# Patient Record
Sex: Female | Born: 1940 | Race: White | Hispanic: No | Smoking: Former smoker
Health system: Southern US, Community
[De-identification: ages and names within clinical notes are randomized; demographics above are authoritative.]

## PROBLEM LIST (undated history)

## (undated) DIAGNOSIS — Z951 Presence of aortocoronary bypass graft: Secondary | ICD-10-CM

## (undated) DIAGNOSIS — I2510 Atherosclerotic heart disease of native coronary artery without angina pectoris: Secondary | ICD-10-CM

## (undated) DIAGNOSIS — E785 Hyperlipidemia, unspecified: Secondary | ICD-10-CM

## (undated) DIAGNOSIS — I340 Nonrheumatic mitral (valve) insufficiency: Secondary | ICD-10-CM

## (undated) DIAGNOSIS — I739 Peripheral vascular disease, unspecified: Secondary | ICD-10-CM

## (undated) DIAGNOSIS — IMO0002 Reserved for concepts with insufficient information to code with codable children: Secondary | ICD-10-CM

## (undated) DIAGNOSIS — F419 Anxiety disorder, unspecified: Secondary | ICD-10-CM

## (undated) DIAGNOSIS — O0382 Renal failure following complete or unspecified spontaneous abortion: Secondary | ICD-10-CM

## (undated) DIAGNOSIS — I4891 Unspecified atrial fibrillation: Secondary | ICD-10-CM

## (undated) DIAGNOSIS — K219 Gastro-esophageal reflux disease without esophagitis: Secondary | ICD-10-CM

## (undated) DIAGNOSIS — I10 Essential (primary) hypertension: Secondary | ICD-10-CM

## (undated) DIAGNOSIS — I779 Disorder of arteries and arterioles, unspecified: Secondary | ICD-10-CM

## (undated) DIAGNOSIS — M1990 Unspecified osteoarthritis, unspecified site: Secondary | ICD-10-CM

## (undated) DIAGNOSIS — Z9889 Other specified postprocedural states: Secondary | ICD-10-CM

## (undated) DIAGNOSIS — R5383 Other fatigue: Secondary | ICD-10-CM

## (undated) DIAGNOSIS — Z888 Allergy status to other drugs, medicaments and biological substances status: Secondary | ICD-10-CM

## (undated) DIAGNOSIS — D649 Anemia, unspecified: Secondary | ICD-10-CM

## (undated) DIAGNOSIS — N186 End stage renal disease: Secondary | ICD-10-CM

## (undated) DIAGNOSIS — I509 Heart failure, unspecified: Secondary | ICD-10-CM

## (undated) DIAGNOSIS — Z9229 Personal history of other drug therapy: Secondary | ICD-10-CM

## (undated) DIAGNOSIS — J449 Chronic obstructive pulmonary disease, unspecified: Secondary | ICD-10-CM

## (undated) DIAGNOSIS — R112 Nausea with vomiting, unspecified: Secondary | ICD-10-CM

## (undated) DIAGNOSIS — R9430 Abnormal result of cardiovascular function study, unspecified: Secondary | ICD-10-CM

## (undated) DIAGNOSIS — I219 Acute myocardial infarction, unspecified: Secondary | ICD-10-CM

## (undated) DIAGNOSIS — Z992 Dependence on renal dialysis: Secondary | ICD-10-CM

## (undated) HISTORY — DX: Heart failure, unspecified: I50.9

## (undated) HISTORY — DX: Anemia, unspecified: D64.9

## (undated) HISTORY — DX: Personal history of other drug therapy: Z92.29

## (undated) HISTORY — DX: Abnormal result of cardiovascular function study, unspecified: R94.30

## (undated) HISTORY — DX: Hyperlipidemia, unspecified: E78.5

## (undated) HISTORY — DX: Nonrheumatic mitral (valve) insufficiency: I34.0

## (undated) HISTORY — DX: Allergy status to other drugs, medicaments and biological substances: Z88.8

## (undated) HISTORY — DX: Peripheral vascular disease, unspecified: I73.9

## (undated) HISTORY — DX: Atherosclerotic heart disease of native coronary artery without angina pectoris: I25.10

## (undated) HISTORY — DX: Acute myocardial infarction, unspecified: I21.9

## (undated) HISTORY — DX: Disorder of arteries and arterioles, unspecified: I77.9

## (undated) HISTORY — PX: APPENDECTOMY: SHX54

## (undated) HISTORY — DX: Other fatigue: R53.83

## (undated) HISTORY — DX: Presence of aortocoronary bypass graft: Z95.1

## (undated) HISTORY — DX: Unspecified atrial fibrillation: I48.91

## (undated) HISTORY — DX: Essential (primary) hypertension: I10

## (undated) HISTORY — PX: ARTERIOVENOUS GRAFT PLACEMENT: SUR1029

## (undated) HISTORY — DX: Unspecified osteoarthritis, unspecified site: M19.90

## (undated) HISTORY — DX: Reserved for concepts with insufficient information to code with codable children: IMO0002

## (undated) HISTORY — PX: ABDOMINAL HYSTERECTOMY: SHX81

## (undated) HISTORY — PX: LAPAROSCOPIC SALPINGOOPHERECTOMY: SUR795

---

## 2001-09-23 HISTORY — PX: OTHER SURGICAL HISTORY: SHX169

## 2002-02-01 ENCOUNTER — Encounter: Payer: Self-pay | Admitting: Vascular Surgery

## 2002-02-03 ENCOUNTER — Ambulatory Visit (HOSPITAL_COMMUNITY): Admission: RE | Admit: 2002-02-03 | Discharge: 2002-02-03 | Payer: Self-pay | Admitting: Vascular Surgery

## 2002-02-08 ENCOUNTER — Inpatient Hospital Stay (HOSPITAL_COMMUNITY): Admission: RE | Admit: 2002-02-08 | Discharge: 2002-02-10 | Payer: Self-pay | Admitting: Vascular Surgery

## 2002-03-16 ENCOUNTER — Inpatient Hospital Stay (HOSPITAL_COMMUNITY): Admission: AD | Admit: 2002-03-16 | Discharge: 2002-03-24 | Payer: Self-pay | Admitting: Vascular Surgery

## 2002-04-15 ENCOUNTER — Encounter: Payer: Self-pay | Admitting: *Deleted

## 2002-04-19 ENCOUNTER — Inpatient Hospital Stay (HOSPITAL_COMMUNITY): Admission: RE | Admit: 2002-04-19 | Discharge: 2002-04-22 | Payer: Self-pay | Admitting: Vascular Surgery

## 2002-09-23 HISTORY — PX: BASAL CELL CARCINOMA EXCISION: SHX1214

## 2002-09-23 HISTORY — PX: OTHER SURGICAL HISTORY: SHX169

## 2006-07-24 ENCOUNTER — Inpatient Hospital Stay (HOSPITAL_COMMUNITY): Admission: AD | Admit: 2006-07-24 | Discharge: 2006-08-02 | Payer: Self-pay | Admitting: Cardiology

## 2006-07-24 ENCOUNTER — Ambulatory Visit: Payer: Self-pay | Admitting: Cardiology

## 2006-07-25 ENCOUNTER — Encounter: Payer: Self-pay | Admitting: Vascular Surgery

## 2006-07-26 ENCOUNTER — Encounter (INDEPENDENT_AMBULATORY_CARE_PROVIDER_SITE_OTHER): Payer: Self-pay | Admitting: *Deleted

## 2006-07-26 ENCOUNTER — Encounter: Payer: Self-pay | Admitting: Cardiovascular Disease

## 2006-07-27 HISTORY — PX: CORONARY ARTERY BYPASS GRAFT: SHX141

## 2006-08-15 ENCOUNTER — Ambulatory Visit: Payer: Self-pay | Admitting: Cardiology

## 2006-09-12 ENCOUNTER — Ambulatory Visit: Payer: Self-pay | Admitting: Cardiology

## 2006-10-17 ENCOUNTER — Ambulatory Visit: Payer: Self-pay | Admitting: Cardiology

## 2006-10-23 ENCOUNTER — Ambulatory Visit: Payer: Self-pay | Admitting: Cardiology

## 2006-12-18 ENCOUNTER — Ambulatory Visit: Payer: Self-pay | Admitting: Cardiology

## 2007-01-13 ENCOUNTER — Ambulatory Visit: Payer: Self-pay | Admitting: Nurse Practitioner

## 2008-04-11 ENCOUNTER — Ambulatory Visit: Payer: Self-pay | Admitting: Cardiology

## 2008-08-05 ENCOUNTER — Ambulatory Visit: Payer: Self-pay | Admitting: Cardiology

## 2008-08-08 ENCOUNTER — Encounter: Payer: Self-pay | Admitting: Cardiology

## 2008-09-25 ENCOUNTER — Encounter: Payer: Self-pay | Admitting: Cardiology

## 2008-09-26 ENCOUNTER — Encounter (INDEPENDENT_AMBULATORY_CARE_PROVIDER_SITE_OTHER): Payer: Self-pay | Admitting: Internal Medicine

## 2008-09-26 ENCOUNTER — Encounter: Payer: Self-pay | Admitting: Cardiology

## 2008-09-26 ENCOUNTER — Inpatient Hospital Stay (HOSPITAL_COMMUNITY): Admission: EM | Admit: 2008-09-26 | Discharge: 2008-09-28 | Payer: Self-pay | Admitting: Internal Medicine

## 2008-09-26 ENCOUNTER — Encounter: Payer: Self-pay | Admitting: Internal Medicine

## 2008-09-26 ENCOUNTER — Ambulatory Visit: Payer: Self-pay | Admitting: Internal Medicine

## 2008-09-27 ENCOUNTER — Encounter (INDEPENDENT_AMBULATORY_CARE_PROVIDER_SITE_OTHER): Payer: Self-pay | Admitting: Nephrology

## 2008-09-27 ENCOUNTER — Ambulatory Visit: Payer: Self-pay | Admitting: Surgery

## 2008-09-27 ENCOUNTER — Encounter: Payer: Self-pay | Admitting: Cardiology

## 2008-10-03 ENCOUNTER — Ambulatory Visit: Payer: Self-pay | Admitting: Cardiology

## 2008-10-03 ENCOUNTER — Encounter: Payer: Self-pay | Admitting: Cardiology

## 2008-10-10 ENCOUNTER — Ambulatory Visit: Payer: Self-pay | Admitting: Vascular Surgery

## 2008-10-12 ENCOUNTER — Ambulatory Visit: Payer: Self-pay | Admitting: Cardiology

## 2008-10-19 ENCOUNTER — Ambulatory Visit (HOSPITAL_COMMUNITY): Admission: RE | Admit: 2008-10-19 | Discharge: 2008-10-19 | Payer: Self-pay | Admitting: Vascular Surgery

## 2008-11-01 ENCOUNTER — Encounter: Payer: Self-pay | Admitting: Cardiology

## 2008-11-01 ENCOUNTER — Inpatient Hospital Stay (HOSPITAL_COMMUNITY): Admission: AD | Admit: 2008-11-01 | Discharge: 2008-11-02 | Payer: Self-pay | Admitting: Internal Medicine

## 2008-11-30 ENCOUNTER — Ambulatory Visit: Payer: Self-pay | Admitting: Cardiology

## 2009-01-02 ENCOUNTER — Ambulatory Visit: Payer: Self-pay | Admitting: Cardiology

## 2009-04-18 ENCOUNTER — Encounter: Payer: Self-pay | Admitting: Cardiology

## 2009-05-31 ENCOUNTER — Ambulatory Visit (HOSPITAL_COMMUNITY): Admission: RE | Admit: 2009-05-31 | Discharge: 2009-05-31 | Payer: Self-pay | Admitting: Nephrology

## 2009-07-06 DIAGNOSIS — I080 Rheumatic disorders of both mitral and aortic valves: Secondary | ICD-10-CM

## 2009-07-06 DIAGNOSIS — I10 Essential (primary) hypertension: Secondary | ICD-10-CM | POA: Insufficient documentation

## 2009-07-27 ENCOUNTER — Encounter: Payer: Self-pay | Admitting: Cardiology

## 2009-07-27 DIAGNOSIS — N186 End stage renal disease: Secondary | ICD-10-CM

## 2009-07-28 ENCOUNTER — Ambulatory Visit: Payer: Self-pay | Admitting: Cardiology

## 2009-09-09 ENCOUNTER — Ambulatory Visit (HOSPITAL_COMMUNITY): Admission: RE | Admit: 2009-09-09 | Discharge: 2009-09-09 | Payer: Self-pay | Admitting: Internal Medicine

## 2009-10-04 ENCOUNTER — Encounter: Payer: Self-pay | Admitting: Cardiology

## 2009-12-25 ENCOUNTER — Ambulatory Visit (HOSPITAL_COMMUNITY): Admission: RE | Admit: 2009-12-25 | Discharge: 2009-12-25 | Payer: Self-pay | Admitting: Nephrology

## 2010-03-27 ENCOUNTER — Ambulatory Visit: Payer: Self-pay | Admitting: Cardiology

## 2010-03-27 ENCOUNTER — Encounter: Payer: Self-pay | Admitting: Cardiology

## 2010-03-29 ENCOUNTER — Encounter: Payer: Self-pay | Admitting: Cardiology

## 2010-04-16 ENCOUNTER — Ambulatory Visit: Payer: Self-pay | Admitting: Physician Assistant

## 2010-10-17 ENCOUNTER — Other Ambulatory Visit (HOSPITAL_COMMUNITY): Payer: Self-pay | Admitting: Nephrology

## 2010-10-17 DIAGNOSIS — N186 End stage renal disease: Secondary | ICD-10-CM

## 2010-10-23 NOTE — Letter (Signed)
Summary: [facility] D/C DR. Selinda Flavin  [facility] D[facility]C DR. Selinda Flavin   Imported By: Zachary George 04/02/2010 13:06:06  _____________________________________________________________________  External Attachment:    Type:   Image     Comment:   External Document

## 2010-10-23 NOTE — Consult Note (Signed)
Summary: CARDIOLOGY CONSULT/ [facility]  CARDIOLOGY CONSULT/ [facility]   Imported By: Zachary George 04/02/2010 13:34:05  _____________________________________________________________________  External Attachment:    Type:   Image     Comment:   External Document

## 2010-10-23 NOTE — Assessment & Plan Note (Signed)
Summary: eph d/c [facility]-7   Visit Type:  hospital follow-up Primary Provider:  Selinda Flavin MD   History of Present Illness: patient presents for post hospital followup.  She was recently hospitalized here with acute/chronic congestive heart failure. She had minimally elevated troponins, as noted by Dr. Myrtis Ser, who felt that this could be attributed to her CHF. She was essentially treated for volume overload, and had as much as 8 pounds of fluid removed during dialysis.  Echocardiography indicated decrease in LVF (EF 25-30%), compared to previous study of 45-50% in November 2009.  Clinically, she reports today that she is "feeling fine". She denies any symptoms suggestive of decompensated heart failure. She denies chest pain.   Preventive Screening-Counseling & Management  Alcohol-Tobacco     Smoking Status: quit     Year Quit: 2003  Current Medications (verified): 1)  Clonidine Hcl 0.1 Mg Tabs (Clonidine Hcl) .... Take 1 Tablet By Mouth Twice A Day 2)  Toprol Xl 100 Mg Xr24h-Tab (Metoprolol Succinate) .... Take 1/2 Tablet By Mouth Three Times A Day 3)  Renvela 800 Mg Tabs (Sevelamer Carbonate) .... 6 At Meals  and 3 At Snacks 4)  Lisinopril 10 Mg Tabs (Lisinopril) .... Take 1 Tablet By Mouth Once A Day 5)  Plavix 75 Mg Tabs (Clopidogrel Bisulfate) .... Take 1 Tablet By Mouth Once A Day 6)  Isosorbide Mononitrate Cr 30 Mg Xr24h-Tab (Isosorbide Mononitrate) .... Take 1 Tablet By Mouth Once A Day 7)  Dialyvite 3000 3 Mg Tabs (B Complex-C-Biotin-E-Min-Fa) .... Take 1 Tablet By Mouth Once A Day 8)  Alprazolam 0.5 Mg Tabs (Alprazolam) .... Take 1 Tablet By Mouth Three Times A Day 9)  Hydrocodone-Acetaminophen 7.5-500 Mg Tabs (Hydrocodone-Acetaminophen) .... Take 1 Tablet By Mouth Three Times A Day 10)  Lidocaine-Prilocaine 2.5-2.5 % Kit (Lidocaine-Prilocaine) .... Apply 1 Hour Before Dialysis 11)  Humulin N 100 Unit/ml Susp (Insulin Isophane Human) .... Use As Directed  Allergies  (verified): 1)  ! Pcn 2)  ! Asa  Comments:  Nurse/Medical Assistant: The patient's medication list and allergies were reviewed with the patient and were updated in the Medication and Allergy Lists.  Past History:  Past Medical History: Last updated: 07/28/2009 MITRAL REGURGITATION (ICD-396.3)...mild to moderate.. echo.. January, 2010 EF... 40%.. echo... January, 2010... hypokinesis base/mid septum  and mid/distal inferoposterior wall HYPERTENSION, UNSPECIFIED (ICD-401.9) HYPERLIPIDEMIA-MIXED (ICD-272.4) CAD.Marland KitchenMarland KitchenCardiolite... January, 2010... no ischemia CABG...2007 CARDIOMYOPATHY, ISCHEMIC (ICD-414.8) ESRD...dialysis... right arm shunt..  /  . PCI to the shunt... interventional radiology... September, 2010 Diabetes... insulin-dependent Anemia... chronic Peripheral arterial disease...right axillofemoral bypass.. Dr.Early Aspirin... allergy.... takes Plavix  Review of Systems       No fevers, chills, hemoptysis, dysphagia, melena, hematocheezia, hematuria, rash, claudication, orthopnea, pnd, pedal edema. All other systems negative.   Vital Signs:  Patient profile:   70 year old female Height:      65 inches Weight:      126 pounds Pulse rate:   84 / minute BP sitting:   165 / 70  (left arm) Cuff size:   regular  Vitals Entered By: Carlye Grippe (April 16, 2010 1:34 PM)  Physical Exam  Additional Exam:  GEN:70 year old female, sitting upright, no distress HEENT: NCAT,PERRLA,EOMI NECK: palpable pulses, right-sided bruits; no JVD; no TM LUNGS: CTA bilaterally HEART: RRR (S1S2); soft, grade 2/6 systolic ejection murmur; no rubs; no gallops ABD: soft, NT; intact BS EXT: intact distal pulses; no edema SKIN: warm, dry MUSC: no obvious deformity NEURO: A/O (x3)     Impression & Recommendations:  Problem # 1:  CARDIOMYOPATHY, ISCHEMIC (ICD-414.8)  patient is clinically stable, following recent hospitalization for volume overload/CHF, successfully treated with  aggressive hemodialysis. She denies any chest pain. Therefore, no further cardiac workup at this time. Recent echocardiogram does suggest a decline in LVF  (EF 25-30%). Will plan early clinic followup with Dr. Myrtis Ser in 3 months, at which time we may need to consider a repeat echocardiogram. If she demonstrates persistently depressed LVEF, we may need to consider her as a possible candidate for ICD implantation.  Problem # 2:  ESRD (ICD-585.6) Assessment: Comment Only  Problem # 3:  HYPERTENSION, UNSPECIFIED (ICD-401.9)  followed by Drs. Dimas Aguas and [place]. Patient reports readings of 140-150 systolic range, at home. No medication adjustments recommended today.  Problem # 4:  PVD (ICD-443.9)  followup Dr. Tawanna Cooler Early.  Patient Instructions: 1)  Your physician wants you to follow-up in: 3 months. You will receive a reminder letter in the mail one-two months in advance. If you don't receive a letter, please call our office to schedule the follow-up appointment. 2)  Your physician recommends that you continue on your current medications as directed. Please refer to the Current Medication list given to you today.

## 2010-10-23 NOTE — Letter (Signed)
Summary: External Correspondence/ OFFICE NOTE DR. HOWARD  External Correspondence/ OFFICE NOTE DR. HOWARD   Imported By: Dorise Hiss 10/10/2009 10:46:34  _____________________________________________________________________  External Attachment:    Type:   Image     Comment:   External Document

## 2010-10-31 ENCOUNTER — Ambulatory Visit (HOSPITAL_COMMUNITY)
Admission: RE | Admit: 2010-10-31 | Discharge: 2010-10-31 | Disposition: A | Discharge status: Home / Self Care | Payer: Medicare Other | Source: Ambulatory Visit | Attending: Nephrology | Admitting: Nephrology

## 2010-10-31 ENCOUNTER — Other Ambulatory Visit (HOSPITAL_COMMUNITY): Payer: Self-pay

## 2010-10-31 DIAGNOSIS — Y849 Medical procedure, unspecified as the cause of abnormal reaction of the patient, or of later complication, without mention of misadventure at the time of the procedure: Secondary | ICD-10-CM | POA: Insufficient documentation

## 2010-10-31 DIAGNOSIS — N186 End stage renal disease: Secondary | ICD-10-CM | POA: Insufficient documentation

## 2010-10-31 DIAGNOSIS — T82898A Other specified complication of vascular prosthetic devices, implants and grafts, initial encounter: Secondary | ICD-10-CM | POA: Insufficient documentation

## 2010-10-31 MED ORDER — IOHEXOL 300 MG/ML  SOLN: 36.0000 mL | Freq: Once | INTRAMUSCULAR | Status: AC | PRN

## 2010-11-16 ENCOUNTER — Ambulatory Visit (INDEPENDENT_AMBULATORY_CARE_PROVIDER_SITE_OTHER): Payer: Medicare Other

## 2010-11-16 DIAGNOSIS — N186 End stage renal disease: Secondary | ICD-10-CM

## 2010-11-16 NOTE — H&P (Signed)
HISTORY AND PHYSICAL EXAMINATION  November 16, 2010  Re:  [name], [name] CORUM                  DOB:  1941/05/21  HISTORY OF PRESENT ILLNESS:  The patient is a 70 year old Caucasian female who presents today at the request of her nephrologist regarding her right forearm AVG.  The patient dialyzes on Tuesday, Thursday and Saturday and this graft was placed by Dr. Hart Rochester on 10/19/2008.  The patient has been utilizing this access since that time with intermittent difficulties.  She states that the dialysis tech informs her on occasion that she has a poor flow rate.  The patient states that she has had interventions by interventional radiology at [facility] and at [facility] with the most recent intervention at [facility] last week.  The patient states that she has been told that she has had angioplasties during these procedures but none of these records are available for review. The patient states that at her most recent evaluation last week in [place] she was told that angioplasty was not possible and that she would need to see a surgeon regarding revision of her graft.  The patient states that she is still dialyzing through the graft despite the fact that she has been told that the flow rate is decreased.  The patient denies chest pain, shortness of breath, CVA, TIA symptoms, claudication symptoms, nausea, vomiting, diarrhea and constipation.  PAST MEDICAL AND SURGICAL HISTORY: 1. End-stage renal disease, dialysis Tuesday, Thursday, Saturday.     Nephrologist, Dr. Kristian Covey. 2. Coronary artery disease status post CABG 2007. 3. Hypertension. 4. MI 2007. 5. Diabetes mellitus. 6. Dyslipidemia. 7. COPD. 8. History of tobacco abuse. 9. Peripheral vascular disease status post right axfem bypass.  SOCIAL HISTORY:  The patient lives at home and is retired.  She smoked one to two packs per day for approximately 45 years.  She denies alcohol use.  FAMILY HISTORY:  Sister with  end-stage renal disease.  ALLERGIES: 1. Aspirin. 2. Penicillin. 3. Nonsteroidal anti-inflammatories.  MEDICATIONS:  The patient did not bring a list of her medications with her.  REVIEW OF SYSTEMS:  A complete review of systems is negative except as delineated in the HPI.  PHYSICAL EXAM:  Vital signs:  Blood pressure 177/75, O2 saturation 89, heart rate 91.  General:  This is a well-nourished female in no acute distress.  HEENT:  PERRLA.  EOMI.  Conjunctivae are normal.  Lungs: Clear to auscultation.  Cardiovascular:  Regular rate and rhythm. Abdomen:  Soft, nontender with active bowel sounds.  Musculoskeletal: No major deformities or cyanosis are noted.  There are 2+ radial and ulnar pulses present at the bilateral upper extremities.  2+ posterior tibial pulses present bilaterally.  The axfem bypass graft has a palpable pulse.  The patient has a right forearm AVG with thrill and bruit present.  There is ecchymosis along the medial aspect of the upper arm from her procedure performed last week in [place].  Neuro:  No focal weakness or paresthesias.  Skin:  No ulcers or rashes.  ASSESSMENT: 1. End-stage renal disease with dialysis Tuesday, Thursday, Saturday     with a decreased flow rate in her right forearm AVG per dialysis     techs. 2. Hypertension. 3. Peripheral vascular disease.  The patient is discussed with Dr. Imogene Burn who agrees that the patient should undergo a shuntogram with possible intervention by him on 11/26/2010.  This was discussed with the patient.  She understands and  agrees to proceed.  The remainder of the patient's medical issues will continue to be monitored and regulated by the medical service.  [name], [place]  Fransisco Hertz, MD Electronically Signed  AY/MEDQ  D:  11/16/2010  T:  11/16/2010  Job:  [id number]

## 2010-11-19 ENCOUNTER — Encounter: Payer: Self-pay | Admitting: Cardiology

## 2010-11-21 ENCOUNTER — Encounter: Payer: Self-pay | Admitting: Cardiology

## 2010-11-26 ENCOUNTER — Encounter (INDEPENDENT_AMBULATORY_CARE_PROVIDER_SITE_OTHER): Payer: Self-pay | Admitting: *Deleted

## 2010-11-26 ENCOUNTER — Ambulatory Visit (HOSPITAL_COMMUNITY)
Admission: RE | Admit: 2010-11-26 | Discharge: 2010-11-26 | Disposition: A | Discharge status: Home / Self Care | Payer: Medicare Other | Source: Ambulatory Visit | Attending: Vascular Surgery | Admitting: Vascular Surgery

## 2010-11-26 ENCOUNTER — Encounter: Payer: Self-pay | Admitting: Vascular Surgery

## 2010-11-26 DIAGNOSIS — E119 Type 2 diabetes mellitus without complications: Secondary | ICD-10-CM | POA: Insufficient documentation

## 2010-11-26 DIAGNOSIS — Z01810 Encounter for preprocedural cardiovascular examination: Secondary | ICD-10-CM | POA: Insufficient documentation

## 2010-11-26 DIAGNOSIS — I120 Hypertensive chronic kidney disease with stage 5 chronic kidney disease or end stage renal disease: Secondary | ICD-10-CM

## 2010-11-26 DIAGNOSIS — N186 End stage renal disease: Secondary | ICD-10-CM | POA: Insufficient documentation

## 2010-11-26 DIAGNOSIS — I2510 Atherosclerotic heart disease of native coronary artery without angina pectoris: Secondary | ICD-10-CM | POA: Insufficient documentation

## 2010-11-26 DIAGNOSIS — Z87891 Personal history of nicotine dependence: Secondary | ICD-10-CM | POA: Insufficient documentation

## 2010-11-26 DIAGNOSIS — T82898A Other specified complication of vascular prosthetic devices, implants and grafts, initial encounter: Secondary | ICD-10-CM

## 2010-11-26 DIAGNOSIS — E785 Hyperlipidemia, unspecified: Secondary | ICD-10-CM | POA: Insufficient documentation

## 2010-11-26 DIAGNOSIS — I739 Peripheral vascular disease, unspecified: Secondary | ICD-10-CM | POA: Insufficient documentation

## 2010-11-26 DIAGNOSIS — J449 Chronic obstructive pulmonary disease, unspecified: Secondary | ICD-10-CM | POA: Insufficient documentation

## 2010-11-26 DIAGNOSIS — I252 Old myocardial infarction: Secondary | ICD-10-CM | POA: Insufficient documentation

## 2010-11-26 DIAGNOSIS — J4489 Other specified chronic obstructive pulmonary disease: Secondary | ICD-10-CM | POA: Insufficient documentation

## 2010-11-26 DIAGNOSIS — Z992 Dependence on renal dialysis: Secondary | ICD-10-CM | POA: Insufficient documentation

## 2010-11-26 DIAGNOSIS — Z951 Presence of aortocoronary bypass graft: Secondary | ICD-10-CM | POA: Insufficient documentation

## 2010-11-26 DIAGNOSIS — Y832 Surgical operation with anastomosis, bypass or graft as the cause of abnormal reaction of the patient, or of later complication, without mention of misadventure at the time of the procedure: Secondary | ICD-10-CM | POA: Insufficient documentation

## 2010-11-27 LAB — POCT I-STAT, CHEM 8
BUN: 63 mg/dL — ABNORMAL HIGH (ref 6–23)
Calcium, Ion: 1.25 mmol/L (ref 1.12–1.32)
Chloride: 111 mEq/L (ref 96–112)
Glucose, Bld: 108 mg/dL — ABNORMAL HIGH (ref 70–99)
HCT: 30 % — ABNORMAL LOW (ref 36.0–46.0)
Potassium: 5.2 mEq/L — ABNORMAL HIGH (ref 3.5–5.1)

## 2010-11-28 ENCOUNTER — Encounter: Payer: Self-pay | Admitting: Cardiology

## 2010-11-28 DIAGNOSIS — I5022 Chronic systolic (congestive) heart failure: Secondary | ICD-10-CM

## 2010-11-30 ENCOUNTER — Ambulatory Visit (HOSPITAL_COMMUNITY): Payer: Medicare Other

## 2010-11-30 ENCOUNTER — Encounter: Payer: Self-pay | Admitting: Vascular Surgery

## 2010-11-30 ENCOUNTER — Ambulatory Visit (HOSPITAL_COMMUNITY)
Admission: RE | Admit: 2010-11-30 | Discharge: 2010-11-30 | Disposition: A | Discharge status: Home / Self Care | Payer: Medicare Other | Source: Ambulatory Visit | Attending: Vascular Surgery | Admitting: Vascular Surgery

## 2010-11-30 DIAGNOSIS — E785 Hyperlipidemia, unspecified: Secondary | ICD-10-CM | POA: Insufficient documentation

## 2010-11-30 DIAGNOSIS — T82898A Other specified complication of vascular prosthetic devices, implants and grafts, initial encounter: Secondary | ICD-10-CM

## 2010-11-30 DIAGNOSIS — Y832 Surgical operation with anastomosis, bypass or graft as the cause of abnormal reaction of the patient, or of later complication, without mention of misadventure at the time of the procedure: Secondary | ICD-10-CM | POA: Insufficient documentation

## 2010-11-30 DIAGNOSIS — I739 Peripheral vascular disease, unspecified: Secondary | ICD-10-CM | POA: Insufficient documentation

## 2010-11-30 DIAGNOSIS — I120 Hypertensive chronic kidney disease with stage 5 chronic kidney disease or end stage renal disease: Secondary | ICD-10-CM

## 2010-11-30 DIAGNOSIS — F17200 Nicotine dependence, unspecified, uncomplicated: Secondary | ICD-10-CM | POA: Insufficient documentation

## 2010-11-30 DIAGNOSIS — J449 Chronic obstructive pulmonary disease, unspecified: Secondary | ICD-10-CM | POA: Insufficient documentation

## 2010-11-30 DIAGNOSIS — Z01810 Encounter for preprocedural cardiovascular examination: Secondary | ICD-10-CM | POA: Insufficient documentation

## 2010-11-30 DIAGNOSIS — I252 Old myocardial infarction: Secondary | ICD-10-CM | POA: Insufficient documentation

## 2010-11-30 DIAGNOSIS — E119 Type 2 diabetes mellitus without complications: Secondary | ICD-10-CM | POA: Insufficient documentation

## 2010-11-30 DIAGNOSIS — N186 End stage renal disease: Secondary | ICD-10-CM

## 2010-11-30 DIAGNOSIS — I2510 Atherosclerotic heart disease of native coronary artery without angina pectoris: Secondary | ICD-10-CM | POA: Insufficient documentation

## 2010-11-30 DIAGNOSIS — Z951 Presence of aortocoronary bypass graft: Secondary | ICD-10-CM | POA: Insufficient documentation

## 2010-11-30 DIAGNOSIS — J4489 Other specified chronic obstructive pulmonary disease: Secondary | ICD-10-CM | POA: Insufficient documentation

## 2010-11-30 DIAGNOSIS — Z7982 Long term (current) use of aspirin: Secondary | ICD-10-CM | POA: Insufficient documentation

## 2010-11-30 LAB — POCT I-STAT 4, (NA,K, GLUC, HGB,HCT)
Glucose, Bld: 135 mg/dL — ABNORMAL HIGH (ref 70–99)
HCT: 31 % — ABNORMAL LOW (ref 36.0–46.0)
Hemoglobin: 10.5 g/dL — ABNORMAL LOW (ref 12.0–15.0)
Sodium: 140 mEq/L (ref 135–145)

## 2010-11-30 LAB — SURGICAL PCR SCREEN
MRSA, PCR: NEGATIVE
Staphylococcus aureus: POSITIVE — AB

## 2010-11-30 LAB — GLUCOSE, CAPILLARY: Glucose-Capillary: 144 mg/dL — ABNORMAL HIGH (ref 70–99)

## 2010-11-30 NOTE — Op Note (Signed)
NAMEDENYCE, HARR NO.:  [id number]  MEDICAL RECORD NO.:  [id number]           PATIENT TYPE:  O  LOCATION:  [facility]                         FACILITY:  [facility]  PHYSICIAN:  Fransisco Hertz, MD       DATE OF BIRTH:  [birth year]  DATE OF PROCEDURE:  11/26/2010 DATE OF DISCHARGE:  11/26/2010                              OPERATIVE REPORT   PROCEDURE: 1. Right forearm arteriovenous graft cannulation under ultrasound     guidance. 2. Shuntogram, right arteriovenous graft. 3. Right central venogram.  PREOPERATIVE DIAGNOSIS:  Outflow stenosis, right arteriovenous graft.  POSTOPERATIVE DIAGNOSIS:  Outflow stenosis, right arteriovenous graft.  SURGEON:  Fransisco Hertz, MD.  ANESTHESIA:  Local.  ESTIMATED BLOOD LOSS:  Minimal.  CONTRAST:  27 mL.  There were no specimens in this case.  FINDINGS:   1.  Patent right arteriovenous graft with an occluded venous outflow at the  level of the basilic vein 2. Stenosis is evident toward the venous anastomosis 3. There is evidence of pressurization of the forearm basilic vein and some of  the side branches which then pressurize a crossing vein which pressurizes the forearm cephalic vein 4. This then continuous into the upper arm as a pressurized cephalic vein, so in fact  at this point, the venous outflow of this arteriovenous graft is actually through this  forearm basilic vein and also the forearm cephalic vein.   5. In the upper arm, the cephalic vein continues with about 4-5 mm diameter up to the  level of its confluence with the axillary vein, which is widely patent as is subclavian  vein and right innominate vein.   6. There is evidence of a small brachial vein that appeared that also empty at the confluence  of the axillary vein.  INDICATIONS:  This is a 70 year old female that previously has undergone a right forearm arteriovenous graft that then went on to require multiple percutaneous interventions.  Most recently  was sent to [facility] for evaluation, it is unclear exactly what they did at the location, but on followup in clinic, it was felt that no revision should be done until further shuntogram was completed to determine what her access options were, so we discussed doing a shuntogram in the right side and then making our operative plan off of that.  DESCRIPTION OF OPERATION:  After full informed written consent was obtained from the patient, she was brought back to the angio suite and placed supine upon the angio table.  I interrogated the graft near its arterial anastomosis, it was noted to be widely patent, then cannulated the graft with a micropuncture needle under ultrasound guidance after injecting about 1 mL of 1% lidocaine without epi.  I then passed the wire into the arteriovenous graft via the micro needle and then needle was exchanged for the micro sheath.  This is secured in place with a Tegaderm.  The sheath was then connected to IV extension tubing and then hand injections were completed, the findings of which are listed above. Based on these findings, I feel that this patient's best next access option  is actually conversion of this forearm arteriovenous graft to a right upper arm brachiocephalic arteriovenous fistula.  Unfortunately the  vein outflow was not quite dilated enough for use immediately, so temporary  placement of a tunneled dialysis catheter may be needed.  She would like to have  this done on Friday.  At this point, I felt no more intervention was possible, so  I placed a pursestring suture around the cannulation site with a 4-0 Monocryl and  then pressure was held as the sheath was removed and the suture tied down.  A  small hematoma was present at the cannulation site, which diffused with some gentle  pressure.    COMPLICATIONS:  None.  CONDITION:  Stable.     Fransisco Hertz, MD     BLC/MEDQ  D:  11/26/2010  T:  11/27/2010  Job:  [id number]  Electronically  Signed by Leonides Sake MD on 11/29/2010 09:32:19 AM

## 2010-12-04 ENCOUNTER — Encounter: Payer: Self-pay | Admitting: Cardiology

## 2010-12-04 NOTE — Op Note (Signed)
[name], [name]                ACCOUNT NO.:  [id number]  MEDICAL RECORD NO.:  [id number]           PATIENT TYPE:  O  LOCATION:  [facility]                         FACILITY:  [facility]  PHYSICIAN:  Larina Earthly, M.D.    DATE OF BIRTH:  [birth year]  DATE OF PROCEDURE:  11/30/2010 DATE OF DISCHARGE:  11/30/2010                              OPERATIVE REPORT   PREOPERATIVE DIAGNOSIS:  End-stage renal disease with poorly functioning right forearm loop arteriovenous Gore-Tex graft.  POSTOPERATIVE DIAGNOSIS:  End-stage renal disease with poorly functioning right forearm loop arteriovenous Gore-Tex graft.  PROCEDURES: 1. Placement of left IJ Diatek catheter with ultrasound visualization. 2. Ligation of right forearm loop arteriovenous Gore-Tex graft and     creation of right upper arm arteriovenous fistula with     brachiocephalic fistula.  SURGEON:  Larina Earthly, MD  ASSISTANT:  Della Goo, PA-C  ANESTHESIA:  MAC.  COMPLICATIONS:  None. DISPOSITION:  To recovery room, stable with chest x-ray pending.  PROCEDURE IN DETAIL:  The patient was taken to the operating room, placed in supine position where the area of the right and left neck were imaged with ultrasound.  The patient had widely patent jugular veins bilaterally.  The patient did have a patent right ax-fem bypass.  For this reason, incision was made to place left-sided catheter.  The patient was placed in Trendelenburg position.  The right and left neck and chest prepped and draped in usual sterile fashion.  Using local anesthesia and a finder needle, the left internal jugular vein was accessed.  Guidewire was passed down centrally and would not pass into the right atrium, but would go back up the right innominate vein.  For this reason, the guiding catheter was passed over the guidewire and this was then directed down to the level of right atrium.  Dilator and peel- away sheath was passed over the guidewire.  The dilator  was removed and the catheter was placed over the guidewire through the peel-away sheath down to the level of right atrium.  The peel-away sheath and guidewire were removed.  The catheter was brought through the subcutaneous tunnel through a separate stab incision.  Two lumen ports were attached in both locked with heparinized saline.  The catheter was secured to the skin with 3-0 nylon stitch.  Entry site was closed with a 4-0 subcuticular Vicryl stitch.  Sterile dressing was applied.  Next, the right arm was prepped and draped in usual sterile fashion.  The patient had well- developed cephalic vein with an occlusion of her basilic vein outflow from her forearm loop graft.  Using local anesthesia, incision was made over the antecubital space, carried down to isolate the arterial to graft anastomosis and the brachial artery was exposed proximal and distal to this.  The venous anastomosis was also exposed.  The cephalic vein was exposed through the same incision and was mobilized further proximally.  The vein was ligated distally, divided, and mobilized to the level of the brachial artery.  The artery was occluded proximally and distally to the old arterial graft anastomosis.  The old arterial graft  anastomosis was taken down and the old graft was excised.  The vein was ligated at the level of the old venous anastomosis and a segment of the graft was resected at the antecubital space.  The cephalic vein was brought into approximation with the brachial artery, was spatulated and sewn end-to-side to the artery with a running 6-0 Prolene suture.  Clamps were removed and excellent thrill was noted. The wound was irrigated with saline.  Hemostasis with electrocautery. Wound was closed with 3-0 Vicryl in the subcutaneous and subcuticular tissues.  Benzoin and Steri-Strips were applied.     Larina Earthly, M.D.     TFE/MEDQ  D:  11/30/2010  T:  12/01/2010  Job:  [id number]  Electronically  Signed by TODD EARLY M.D. on 12/04/2010 09:20:02 AM

## 2010-12-05 ENCOUNTER — Encounter: Payer: Self-pay | Admitting: Cardiology

## 2010-12-05 ENCOUNTER — Ambulatory Visit (INDEPENDENT_AMBULATORY_CARE_PROVIDER_SITE_OTHER): Payer: Medicare Other | Admitting: Cardiology

## 2010-12-05 DIAGNOSIS — I428 Other cardiomyopathies: Secondary | ICD-10-CM

## 2010-12-05 DIAGNOSIS — I2510 Atherosclerotic heart disease of native coronary artery without angina pectoris: Secondary | ICD-10-CM

## 2010-12-11 NOTE — Assessment & Plan Note (Signed)
Summary: abnormal echo per patient that dr. Dimas Aguas ordered -srs   Visit Type:  Follow-up Primary Provider:  Selinda Flavin MD  CC:  CAD.  History of Present Illness: Patient is seen for followup of coronary artery disease.  She had been seen last in the office in July, 2011.  It has been noticed that there was a decrease in ejection fraction.  Plans were made to try to adjust her medications and follow her left ventricle.  She has had some clinical CHF.  Her dry weight is now decreased with dialysis.  She is feeling better.  The graft in her right arm failed.  She has a catheter in the left subclavian.  In the past few days she has a new AV fistula in the right arm.  She's stable today.  He's not having any chest pain.  As part of today's note I have reviewed an echo report from November 28, 2010.  I reviewed surgical reports concerning the surgery to her arm.  I have reviewed old records.  Preventive Screening-Counseling & Management  Alcohol-Tobacco     Smoking Status: quit     Year Quit: 2003  Current Medications (verified): 1)  Clonidine Hcl 0.1 Mg Tabs (Clonidine Hcl) .... Take 1 Tablet By Mouth Twice A Day 2)  Renvela 800 Mg Tabs (Sevelamer Carbonate) .... 6 At Meals  and 3 At Snacks 3)  Lisinopril 20 Mg Tabs (Lisinopril) .... Take 1 Tablet By Mouth Once A Day 4)  Plavix 75 Mg Tabs (Clopidogrel Bisulfate) .... Take 1 Tablet By Mouth Once A Day 5)  Isosorbide Mononitrate Cr 30 Mg Xr24h-Tab (Isosorbide Mononitrate) .... Take 1 Tablet By Mouth Once A Day 6)  Dialyvite 3000 3 Mg Tabs (B Complex-C-Biotin-E-Min-Fa) .... Take 1 Tablet By Mouth Once A Day 7)  Alprazolam 0.5 Mg Tabs (Alprazolam) .... Take 1 Tablet By Mouth Three Times A Day 8)  Hydrocodone-Acetaminophen 7.5-500 Mg Tabs (Hydrocodone-Acetaminophen) .... Take 1 Tablet By Mouth Three Times A Day 9)  Lidocaine-Prilocaine 2.5-2.5 % Kit (Lidocaine-Prilocaine) .... Apply 1 Hour Before Dialysis 10)  Novolin N 100 Unit/ml Susp (Insulin  Isophane Human) .... Use As Directed 11)  Furosemide 80 Mg Tabs (Furosemide) .... Take 1 Tablet By Mouth Once A Day  Allergies (verified): 1)  ! Pcn 2)  ! Asa  Comments:  Nurse/Medical Assistant: The patient's medication list and allergies were reviewed with the patient and were updated in the Medication and Allergy Lists.  Past History:  Past Medical History: MITRAL REGURGITATION (ICD-396.3)...mild to moderate.. echo.. January, 2010  /  moderate... echo... March, 2012... eccentric jet EF... 40%.. echo..09/2008  /  20-25%  echo....hospital..02/2010  /  EF 25-30%... echo... November 28, 2010  HYPERLIPIDEMIA-MIXED (ICD-272.4) CAD.Marland KitchenMarland KitchenCardiolite... January, 2010... no ischemia CABG...2007 CARDIOMYOPATHY, ISCHEMIC (ICD-414.8) ESRD...dialysis... right arm shunt..  /  . PCI to the shunt... interventional radiology... September, 2010  /  temporary catheter in the left subclavian... new AV fistula in the right arm maturing... March, 2012 Diabetes... insulin-dependent Anemia... chronic Peripheral arterial disease...right axillofemoral bypass.. Dr.Early Aspirin... allergy.... takes Plavix  Review of Systems       Patient denies fever, chills, headache, sweats, rash, change in vision, change in hearing, chest pain, cough, nausea vomiting, urinary symptoms.  All the systems are reviewed and are negative.  Vital Signs:  Patient profile:   70 year old female Height:      65 inches Weight:      119 pounds BMI:     19.87 Pulse rate:  92 / minute BP sitting:   115 / 61  (left arm) Cuff size:   regular  Vitals Entered By: Carlye Grippe (December 05, 2010 11:27 AM)  Physical Exam  General:  Patient is stable today. Head:  head is atraumatic. Eyes:  no xanthelasma. Neck:  no jugular venous distention. Chest Wall:  no chest wall tenderness.  Catheter in place in the left subclavian. Lungs:  lungs reveal some scattered rhonchi. Heart:  cardiac exam reveals S1-S2 and a soft systolic  murmur. Abdomen:  abdomen soft. Msk:  no musculoskeletal deformities. Extremities:  old clotted graft in the right arm.  New surgery with AV fistula in the upper right arm. Skin:  some ecchymosis in the right arm. Psych:  patient is oriented to person time and place.  Affect is normal.   Impression & Recommendations:  Problem # 1:  ESRD (ICD-585.6) Dialysis continues.  The patient has a new AV fistula in the right arm.  Dialysis done through the subclavian catheter at this point.  Her weight is being kept on the drier side because of CHF.  Problem # 2:  CAD (ICD-414.00) EKG is done today and reviewed by me.  Her sinus rhythm.  There are old nonspecific ST-T wave changes and there is no significant change.  Concerned about the patient's left ventricular function.  There is no ischemia at this time.  Problem # 3:  HYPERTENSION, UNSPECIFIED (ICD-401.9) Blood pressures are elevated at this time.  No change in therapy.  Problem # 4:  CARDIOMYOPATHY, ISCHEMIC (ICD-414.8) I am concerned the patient has continued left ventricular dysfunction.  She is on clonidine for blood pressure.  She is not on any beta blocker.  Carvedilol will be started [facility] her for followup.  If she has low blood pressure her clonidine dose can be decreased or stopped.  I will continue to titrate her meds over time and then make further decisions about further workup and therapy.  Other Orders: EKG w/ Interpretation (93000)  Patient Instructions: 1)  Follow up with Dr. Myrtis Ser on  Lenor Coffin, January 17, 2011 AT 1:15PM. 2)  Start Carvedilol 3.125mg  (1/2 of the 6.25mg  tablet) two times a day for 10 days and then increase to 6.25mg  (1 tablet) two times a day. Prescriptions: CARVEDILOL 6.25 MG TABS (CARVEDILOL) Take one tablet by mouth twice a day as directed.  #60 x 3   Entered by:   Cyril Loosen, RN, BSN   Authorized by:   Talitha Givens, MD, [facility]   Signed by:   Cyril Loosen, RN, BSN on 12/05/2010   Method used:    Electronically to        [facility] [address] (retail)       [address]       [place], [place]  [address]       Ph: [phone] or [phone]       Fax: [phone]   RxID:   [id number]   Handout requested.

## 2010-12-11 NOTE — Op Note (Signed)
Summary: Operative Report  Operative Report   Imported By: Zachary George 12/05/2010 10:48:04  _____________________________________________________________________  External Attachment:    Type:   Image     Comment:   External Document

## 2010-12-11 NOTE — Miscellaneous (Signed)
  Clinical Lists Changes  Observations: Added new observation of PAST MED HX: MITRAL REGURGITATION (ICD-396.3)...mild to moderate.. echo.. January, 2010 EF... 40%.. echo..09/2008  /  20-25%  echo....hospital..02/2010 HYPERTENSION, UNSPECIFIED (ICD-401.9) HYPERLIPIDEMIA-MIXED (ICD-272.4) CAD.Marland KitchenMarland KitchenCardiolite... January, 2010... no ischemia CABG...2007 CARDIOMYOPATHY, ISCHEMIC (ICD-414.8) ESRD...dialysis... right arm shunt..  /  . PCI to the shunt... interventional radiology... September, 2010 Diabetes... insulin-dependent Anemia... chronic Peripheral arterial disease...right axillofemoral bypass.. Dr.Early Aspirin... allergy.... takes Plavix (12/04/2010 8:21) Added new observation of PRIMARY MD: Selinda Flavin MD (12/04/2010 8:21)       Past History:  Past Medical History: MITRAL REGURGITATION (ICD-396.3)...mild to moderate.. echo.. January, 2010 EF... 40%.. echo..09/2008  /  20-25%  echo....hospital..02/2010 HYPERTENSION, UNSPECIFIED (ICD-401.9) HYPERLIPIDEMIA-MIXED (ICD-272.4) CAD.Marland KitchenMarland KitchenCardiolite... January, 2010... no ischemia CABG...2007 CARDIOMYOPATHY, ISCHEMIC (ICD-414.8) ESRD...dialysis... right arm shunt..  /  . PCI to the shunt... interventional radiology... September, 2010 Diabetes... insulin-dependent Anemia... chronic Peripheral arterial disease...right axillofemoral bypass.. Dr.Early Aspirin... allergy.... takes Plavix

## 2010-12-11 NOTE — Op Note (Signed)
Summary: Operative Report  Operative Report   Imported By: Zachary George 12/05/2010 10:48:34  _____________________________________________________________________  External Attachment:    Type:   Image     Comment:   External Document

## 2010-12-27 ENCOUNTER — Ambulatory Visit (INDEPENDENT_AMBULATORY_CARE_PROVIDER_SITE_OTHER): Payer: Medicare Other

## 2010-12-27 DIAGNOSIS — N186 End stage renal disease: Secondary | ICD-10-CM

## 2010-12-27 NOTE — Assessment & Plan Note (Signed)
OFFICE VISIT  IRACEMA, LANAGAN CORUM DOB:  01/12/1941                                       12/27/2010 ZOXWR#:[id number]  DATE OF SURGERY:  November 30, 2010.  The patient presents today for a routine follow-up status post placement of left IJ Diatek catheter, ligation of right forearm loop graft, and creation of right upper arm AV fistula.  This is a brachiocephalic fistula.  The patient states she has been doing very well since her surgery.  She is without complaint.  She denies symptoms of steal.  She has been utilizing the left Diatek catheter without difficulty.  PHYSICAL EXAMINATION:  There is a 2+ thrill present in the right upper arm AV fistula.  There is a 2+ radial pulse present.  Motor and sensation are intact in the right upper extremity.  Hand is warm and well-perfused.  The left Diatek catheter is clean and intact.  ASSESSMENT/PLAN:  The patient is doing well status post right brachiocephalic arteriovenous fistula.  The patient will follow up in 6- 8 weeks with Dr. Arbie Cookey to monitor the maturation of the fistula.  She understands that she will need to continue to utilize her Diatek catheter until the fistula has complained matured.  The patient will call with any questions, issues, or problems in the interim.  [name], PA  Charles E. Fields, MD Electronically Signed  AY/MEDQ  D:  12/27/2010  T:  12/27/2010  Job:  [id number]

## 2011-01-01 ENCOUNTER — Ambulatory Visit: Payer: Medicare Other | Admitting: Cardiology

## 2011-01-07 LAB — CBC
HCT: 33.7 % — ABNORMAL LOW (ref 36.0–46.0)
MCHC: 31.5 g/dL (ref 30.0–36.0)
MCHC: 31.6 g/dL (ref 30.0–36.0)
MCHC: 32.4 g/dL (ref 30.0–36.0)
MCV: 81.5 fL (ref 78.0–100.0)
MCV: 82.1 fL (ref 78.0–100.0)
Platelets: 324 10*3/uL (ref 150–400)
Platelets: 328 10*3/uL (ref 150–400)
Platelets: 383 10*3/uL (ref 150–400)
RBC: 3.91 MIL/uL (ref 3.87–5.11)
RBC: 3.93 MIL/uL (ref 3.87–5.11)
RDW: 19.9 % — ABNORMAL HIGH (ref 11.5–15.5)
WBC: 6.2 10*3/uL (ref 4.0–10.5)
WBC: 7.8 10*3/uL (ref 4.0–10.5)

## 2011-01-07 LAB — BASIC METABOLIC PANEL
BUN: 39 mg/dL — ABNORMAL HIGH (ref 6–23)
CO2: 22 mEq/L (ref 19–32)
Calcium: 9.2 mg/dL (ref 8.4–10.5)
Calcium: 9.3 mg/dL (ref 8.4–10.5)
Creatinine, Ser: 5.11 mg/dL — ABNORMAL HIGH (ref 0.4–1.2)
Creatinine, Ser: 5.18 mg/dL — ABNORMAL HIGH (ref 0.4–1.2)
GFR calc Af Amer: 10 mL/min — ABNORMAL LOW (ref >60)
GFR calc Af Amer: 10 mL/min — ABNORMAL LOW (ref >60)
Glucose, Bld: 89 mg/dL (ref 70–99)

## 2011-01-07 LAB — LIPID PANEL
HDL: 39 mg/dL — ABNORMAL LOW (ref >39)
Total CHOL/HDL Ratio: 3.5 RATIO
VLDL: 16 mg/dL (ref 0–40)

## 2011-01-07 LAB — PHOSPHORUS: Phosphorus: 6 mg/dL — ABNORMAL HIGH (ref 2.3–4.6)

## 2011-01-07 LAB — CARDIAC PANEL(CRET KIN+CKTOT+MB+TROPI)
CK, MB: 2.8 ng/mL (ref 0.3–4.0)
Relative Index: INVALID (ref 0.0–2.5)
Relative Index: INVALID (ref 0.0–2.5)
Relative Index: INVALID (ref 0.0–2.5)
Total CK: 46 U/L (ref 7–177)
Troponin I: 0.62 ng/mL (ref 0.00–0.06)
Troponin I: 1.15 ng/mL (ref 0.00–0.06)

## 2011-01-07 LAB — GLUCOSE, CAPILLARY
Glucose-Capillary: 118 mg/dL — ABNORMAL HIGH (ref 70–99)
Glucose-Capillary: 144 mg/dL — ABNORMAL HIGH (ref 70–99)
Glucose-Capillary: 158 mg/dL — ABNORMAL HIGH (ref 70–99)
Glucose-Capillary: 220 mg/dL — ABNORMAL HIGH (ref 70–99)
Glucose-Capillary: 71 mg/dL (ref 70–99)
Glucose-Capillary: 81 mg/dL (ref 70–99)

## 2011-01-07 LAB — MAGNESIUM: Magnesium: 2 mg/dL (ref 1.5–2.5)

## 2011-01-07 LAB — HEMOGLOBIN A1C: Mean Plasma Glucose: 131 mg/dL

## 2011-01-07 LAB — COMPREHENSIVE METABOLIC PANEL
AST: 17 U/L (ref 0–37)
CO2: 26 mEq/L (ref 19–32)
Calcium: 8.6 mg/dL (ref 8.4–10.5)
Creatinine, Ser: 5.36 mg/dL — ABNORMAL HIGH (ref 0.4–1.2)
GFR calc Af Amer: 10 mL/min — ABNORMAL LOW (ref >60)
GFR calc non Af Amer: 8 mL/min — ABNORMAL LOW (ref >60)

## 2011-01-07 LAB — RENAL FUNCTION PANEL
BUN: 25 mg/dL — ABNORMAL HIGH (ref 6–23)
Glucose, Bld: 162 mg/dL — ABNORMAL HIGH (ref 70–99)
Phosphorus: 5.7 mg/dL — ABNORMAL HIGH (ref 2.3–4.6)
Potassium: 4.1 mEq/L (ref 3.5–5.1)

## 2011-01-07 LAB — POCT I-STAT 4, (NA,K, GLUC, HGB,HCT)
Glucose, Bld: 97 mg/dL (ref 70–99)
HCT: 48 % — ABNORMAL HIGH (ref 36.0–46.0)
Sodium: 138 mEq/L (ref 135–145)

## 2011-01-07 LAB — TSH: TSH: 0.737 u[IU]/mL (ref 0.350–4.500)

## 2011-01-07 LAB — PROTIME-INR: Prothrombin Time: 15.4 seconds — ABNORMAL HIGH (ref 11.6–15.2)

## 2011-01-07 LAB — HEPARIN LEVEL (UNFRACTIONATED): Heparin Unfractionated: 0.43 IU/mL (ref 0.30–0.70)

## 2011-01-08 LAB — COMPREHENSIVE METABOLIC PANEL
ALT: 10 U/L (ref 0–35)
ALT: 12 U/L (ref 0–35)
AST: 14 U/L (ref 0–37)
AST: 17 U/L (ref 0–37)
Albumin: 3.3 g/dL — ABNORMAL LOW (ref 3.5–5.2)
CO2: 22 mEq/L (ref 19–32)
CO2: 27 mEq/L (ref 19–32)
Calcium: 8.7 mg/dL (ref 8.4–10.5)
Chloride: 98 mEq/L (ref 96–112)
Creatinine, Ser: 6.66 mg/dL — ABNORMAL HIGH (ref 0.4–1.2)
GFR calc Af Amer: 14 mL/min — ABNORMAL LOW (ref >60)
GFR calc Af Amer: 8 mL/min — ABNORMAL LOW (ref >60)
GFR calc non Af Amer: 11 mL/min — ABNORMAL LOW (ref >60)
GFR calc non Af Amer: 6 mL/min — ABNORMAL LOW (ref >60)
Potassium: 4.7 mEq/L (ref 3.5–5.1)
Sodium: 133 mEq/L — ABNORMAL LOW (ref 135–145)
Sodium: 134 mEq/L — ABNORMAL LOW (ref 135–145)
Total Bilirubin: 0.6 mg/dL (ref 0.3–1.2)
Total Protein: 6.5 g/dL (ref 6.0–8.3)

## 2011-01-08 LAB — GLUCOSE, CAPILLARY: Glucose-Capillary: 192 mg/dL — ABNORMAL HIGH (ref 70–99)

## 2011-01-08 LAB — CBC
MCHC: 32.5 g/dL (ref 30.0–36.0)
Platelets: 252 10*3/uL (ref 150–400)
RBC: 5 MIL/uL (ref 3.87–5.11)
RBC: 5.03 MIL/uL (ref 3.87–5.11)
WBC: 5.8 10*3/uL (ref 4.0–10.5)

## 2011-01-08 LAB — CARDIAC PANEL(CRET KIN+CKTOT+MB+TROPI)
Relative Index: INVALID (ref 0.0–2.5)
Relative Index: INVALID (ref 0.0–2.5)
Total CK: 22 U/L (ref 7–177)
Total CK: 33 U/L (ref 7–177)
Troponin I: 0.06 ng/mL (ref 0.00–0.06)

## 2011-01-08 LAB — HEPATITIS B SURFACE ANTIGEN: Hepatitis B Surface Ag: NEGATIVE

## 2011-01-08 LAB — LIPID PANEL
HDL: 38 mg/dL — ABNORMAL LOW (ref >39)
Total CHOL/HDL Ratio: 3.8 RATIO

## 2011-01-08 LAB — PHOSPHORUS: Phosphorus: 5.5 mg/dL — ABNORMAL HIGH (ref 2.3–4.6)

## 2011-01-08 LAB — TSH: TSH: 1.522 u[IU]/mL (ref 0.350–4.500)

## 2011-01-10 DIAGNOSIS — I5023 Acute on chronic systolic (congestive) heart failure: Secondary | ICD-10-CM

## 2011-01-11 DIAGNOSIS — I5022 Chronic systolic (congestive) heart failure: Secondary | ICD-10-CM

## 2011-01-16 ENCOUNTER — Encounter: Payer: Self-pay | Admitting: *Deleted

## 2011-01-17 ENCOUNTER — Encounter: Payer: Self-pay | Admitting: Cardiology

## 2011-01-17 ENCOUNTER — Ambulatory Visit (INDEPENDENT_AMBULATORY_CARE_PROVIDER_SITE_OTHER): Payer: Medicare Other | Admitting: Physician Assistant

## 2011-01-17 DIAGNOSIS — E119 Type 2 diabetes mellitus without complications: Secondary | ICD-10-CM | POA: Insufficient documentation

## 2011-01-17 DIAGNOSIS — I739 Peripheral vascular disease, unspecified: Secondary | ICD-10-CM | POA: Insufficient documentation

## 2011-01-17 DIAGNOSIS — Z888 Allergy status to other drugs, medicaments and biological substances status: Secondary | ICD-10-CM | POA: Insufficient documentation

## 2011-01-17 DIAGNOSIS — E785 Hyperlipidemia, unspecified: Secondary | ICD-10-CM | POA: Insufficient documentation

## 2011-01-17 DIAGNOSIS — Z951 Presence of aortocoronary bypass graft: Secondary | ICD-10-CM | POA: Insufficient documentation

## 2011-01-17 DIAGNOSIS — D649 Anemia, unspecified: Secondary | ICD-10-CM | POA: Insufficient documentation

## 2011-01-17 DIAGNOSIS — N186 End stage renal disease: Secondary | ICD-10-CM

## 2011-01-17 DIAGNOSIS — I340 Nonrheumatic mitral (valve) insufficiency: Secondary | ICD-10-CM | POA: Insufficient documentation

## 2011-01-17 DIAGNOSIS — I429 Cardiomyopathy, unspecified: Secondary | ICD-10-CM | POA: Insufficient documentation

## 2011-01-17 DIAGNOSIS — I10 Essential (primary) hypertension: Secondary | ICD-10-CM

## 2011-01-17 DIAGNOSIS — I2510 Atherosclerotic heart disease of native coronary artery without angina pectoris: Secondary | ICD-10-CM

## 2011-01-17 DIAGNOSIS — R9430 Abnormal result of cardiovascular function study, unspecified: Secondary | ICD-10-CM | POA: Insufficient documentation

## 2011-01-17 DIAGNOSIS — I428 Other cardiomyopathies: Secondary | ICD-10-CM

## 2011-01-17 MED ORDER — CARVEDILOL 6.25 MG PO TABS: 3.1250 mg | ORAL_TABLET | Freq: Two times a day (BID) | ORAL | Status: DC

## 2011-01-17 NOTE — Progress Notes (Signed)
HPI:  70 year old female presents for scheduled early followup. Since her last office visit here with Dr. Myrtis Ser, however, she was briefly hospitalized here at [facility], with mild A/C SHF and volume overload.  Recommendation was to treat with more aggressive hemodialysis. Patient was also maintained on IV Lasix, and was discharged after 48 hours. She was discharged on her recently adjusted home medication regimen, including addition of low-dose carvedilol.  Serial cardiac markers were negative for definite ischemia. There was suggestion of severe CHF, by CXR, and she presented with a BNP level of 2100.  Clinically, she has been doing extremely well since being discharged last week. She is weighing herself daily, noting no increase above 2 pounds per day. She remains compliant with medications, refrains from added salt in her diet, and continues with hemodialysis, as scheduled.  She presents today with a weight of 116, down 3 pounds from her most recent office visit.   Allergies  Allergen Reactions  . Aspirin   . Penicillins     Current Outpatient Prescriptions on File Prior to Visit  Medication Sig Dispense Refill  . ALPRAZolam (XANAX) 0.5 MG tablet Take 0.5 mg by mouth 3 (three) times daily as needed.        . carvedilol (COREG) 6.25 MG tablet Take 6.25 mg by mouth daily.       . cloNIDine (CATAPRES) 0.1 MG tablet Take 0.1 mg by mouth 2 (two) times daily.        . clopidogrel (PLAVIX) 75 MG tablet Take 75 mg by mouth daily.        . folic acid-vitamin b complex-vitamin c-selenium-zinc (DIALYVITE) 3 MG TABS Take 1 tablet by mouth daily.        . furosemide (LASIX) 80 MG tablet Take 80 mg by mouth daily.        Marland Kitchen HYDROcodone-acetaminophen (LORTAB) 7.5-500 MG per tablet Take 1 tablet by mouth every 8 (eight) hours as needed.        . insulin NPH (HUMULIN N,NOVOLIN N) 100 UNIT/ML injection Inject 40 Units into the skin 2 (two) times daily.        . isosorbide mononitrate (IMDUR) 30 MG 24 hr  tablet Take 30 mg by mouth daily.        . sevelamer (RENVELA) 800 MG tablet Take 6 tablets with meals and 3 tablets with snacks      . DISCONTD: lisinopril (PRINIVIL,ZESTRIL) 10 MG tablet Take 10 mg by mouth daily.          Past Medical History  Diagnosis Date  . Mitral regurgitation     Mild-to-moderate, echo, January, 2010 / moderate, e eccentric jet, echo, March, 2012  . Ejection fraction     40%, echo, January, 2010 / EF 20-25%, echo, hospital, June, 2011  / EF 25-30%, echo, March, 2012  . Dyslipidemia   . CAD (coronary artery disease)     Nuclear, January, 2010, no ischemia  . Hx of CABG     2007  . Cardiomyopathy     Ischemic  . ESRD (end stage renal disease)     Dialysis, right arm shunt  / PCI tissue, interventional radiology September, 2010 / temporary catheter left subclavian, new AV fistula right arm maturing March, 201 t2  . Diabetes mellitus   . Anemia     Chronic  . PAD (peripheral artery disease)     Right axillofemoral bypass, Dr. Arbie Cookey  . Aspirin allergy     Takes Plavix    Past Surgical  History  Procedure Date  . Coronary artery bypass graft  07/27/2006    [name]. [name], M.D  . Abdominal hysterectomy   . Laparoscopic salpingoopherectomy   . Appendectomy   . Fem-fem bypass 2003    left to right   . Right femoral bypass 2004  . Basal cell carcinoma excision 2004    REMOVAL FROM NOSE    History   Social History  . Marital Status: Married    Spouse Name: RALPH    Number of Children: N/A  . Years of Education: N/A   Occupational History  . RETIRED     [employer]   Social History Main Topics  . Smoking status: Former Smoker -- 2.0 packs/day for 50 years    Types: Cigarettes    Quit date: 09/23/2005  . Smokeless tobacco: Not on file   Comment: SMOKED FROM AGE 70 UNTIL 2007  . Alcohol Use: No     NO ALCOHOL SINCE THE 70'S  . Drug Use: Not on file  . Sexually Active: Not on file   Other Topics Concern  . Not on  file   Social History Narrative  . No narrative on file    Family History  Problem Relation Age of Onset  . Diabetes Mother   . Kidney disease Father     ROS: The patient denies fatigue, malaise, fever, weight gain/loss, vision loss, decreased hearing, hoarseness, chest pain, palpitations, shortness of breath, prolonged cough, wheezing, sleep apnea, coughing up blood, abdominal pain, blood in stool, nausea, vomiting, diarrhea, heartburn, incontinence, blood in urine, muscle weakness, joint pain, leg swelling, rash, skin lesions, headache, fainting, dizziness, depression, anxiety, enlarged lymph nodes, easy bruising or bleeding, and environmental allergies.     PHYSICAL EXAM:  BP 109/56  Pulse 80  Ht 5\' 5"  (1.651 m)  Wt 116 lb (52.617 kg)  BMI 19.30 kg/m2   General: Well-developed, well-nourished in no distress head: Normocephalic and atraumatic eyes PERRLA/EOMI intact, conjunctiva and lids normal nose: No deformity or lesions mouth normal dentition, normal posterior pharynx neck: Supple, no JVD.  No masses, thyromegaly or abnormal cervical nodes lungs: Normal breath sounds bilaterally without wheezing.  Normal percussion heart: regular rate and rhythm with normal S1 and S2, no S3 or S4.  PMI is normal.  No pathological murmurs abdomen: Normal bowel sounds, abdomen is soft and nontender without masses, organomegaly or hernias noted.  No hepatosplenomegaly musculoskeletal: Back normal, normal gait muscle strength and tone normal pulsus: Pulse is normal in all 4 extremities Extremities: No peripheral pitting edema neurologic: Alert and oriented x 3 skin: Intact without lesions or rashes cervical nodes: No significant adenopathy psychologic: Normal affect  EKG:    ASSESSMENT & PLAN:

## 2011-01-17 NOTE — Assessment & Plan Note (Signed)
No further workup currently indicated. Patient ruled out for MI with negative cardiac markers, during her recent hospitalization, and presents with no complaint of chest pain.

## 2011-01-17 NOTE — Assessment & Plan Note (Signed)
Given her stable, but low normal BP, am unable to further titrate her medications. Of note, we'll adjust carvedilol to 3.125 mg b.i.d., for more appropriate dosing.

## 2011-01-17 NOTE — Patient Instructions (Signed)
   Follow up as scheduled.  Take Coreg (carvedilol) 6.25mg  1/2 tablet by mouth two times a day.

## 2011-01-17 NOTE — Assessment & Plan Note (Signed)
Patient presents today in clinically stable condition, with no current signs or symptoms of decompensated heart failure. She has not had recurrent SOB/DOE, since her recent brief hospitalization. She has been maintaining stable weights, and is on a strict hemodialysis schedule, including increased dialysis time, as recently recommended. Therefore, we'll continue current diuretic regimen, and readjust her carvedilol dose to 3.25 mg b.i.d., given her current low-normal blood pressure reading. Will plan early return follow up with Dr. Myrtis Ser, In approximately 4 weeks.

## 2011-01-17 NOTE — Assessment & Plan Note (Addendum)
Patient is maintaining strict hemodialysis schedule, as recently outlined, including increased dialysis time. We feel that this more aggressive treatment was sufficient to effectively treat her recent mild CHF/volume overload. She is awaiting clearance to begin her HD through her RUE fistula.

## 2011-02-05 ENCOUNTER — Ambulatory Visit (INDEPENDENT_AMBULATORY_CARE_PROVIDER_SITE_OTHER): Payer: Medicare Other | Admitting: Vascular Surgery

## 2011-02-05 DIAGNOSIS — N186 End stage renal disease: Secondary | ICD-10-CM

## 2011-02-05 NOTE — Assessment & Plan Note (Signed)
[facility]                          [facility] OFFICE NOTE   [name], [name]                   MRN:          [id number]  DATE:10/12/2008                            DOB:          December 09, 1940    [name] is seen for a Cardiology followup.  She had been  hospitalized in January 2010.  In addition, she needs cardiac clearance  to have a vascular access procedure by Dr. Jerilee Field next week.   The patient has very significant vascular disease.  She is stable at  this time.  In September 2009, her dialysis was begun.  She has a  history of CABG in 2007.  Her most recent echo revealed an ejection  fraction in the 40% range with inferolateral akinesis.  She then  presented with hospitalization with a non-STEMI and hypertensive  emergency.  Her blood pressure was treated and her volume status was  stabilized.  She stabilized at [facility] and it was felt that she could  have followup Cardiolite scan done as an outpatient for further  assessment.  This scan was done on October 03, 2008, at [place].  Ejection fraction was read as low as 32%.  We know by echo that her EF  is probably better.  She had evidence of an inferolateral infarct, but  no definite ischemia.   She is here today.  She is not having any chest pain.  She is fully  active, working with grandchildren, and cleaning her house.  Her  exercise capacity is certainly greater than 4 METS.   PAST MEDICAL HISTORY:   ALLERGIES:  She has had throat swelling from PENICILLIN and ASPIRIN.  She should not take nonsteroidal anti-inflammatory meds.   MEDICATIONS:  Pravachol, Imdur, Plavix, Xanax, Humulin, and labetalol.  Currently, she is on labetalol 200 b.i.d.  She is on clonidine 0.1  b.i.d. and Cardizem 120 mg daily.   OTHER MEDICAL PROBLEMS:  See the complete list below.   REVIEW OF SYSTEMS:  She is not having any GI or GU symptoms.  She is not  any having any headaches, fevers,  or chills.  There are no major skin  rashes.  She is looking forward to have the vascular procedure as needed  through Dr. Hart Rochester.  Her review of systems, otherwise, is negative.   PHYSICAL EXAMINATION:  VITAL SIGNS:  Heart rate is 90.  Weight is 123  pounds.  Blood pressure is 150/80.  GENERAL:  The patient is oriented to person, time, and place.  Affect is  normal.  HEENT:  No xanthelasma.  She has normal extraocular motion.  NECK:  There are no carotid bruits.  There is no jugular venous  distention.  LUNGS:  Clear.  Respiratory effort is not labored.  CARDIAC:  S1 with an S2.  There are no clicks or significant murmurs.  She has a Hickman catheter in her left anterior chest that has been used  for dialysis.  ABDOMEN:  Soft.  EXTREMITIES:  She has no peripheral edema.   PROBLEMS:  #1.  Diabetes treated.  #2.  Chronic renal failure, treated.  #  3.  Dialysis since September 2009.  #4.  Hypertension.  She had a recent hypertensive crisis and this is  stabilized.  #5.  Chronic obstructive pulmonary disease.  #6.  Coronary artery disease.  #7.  History of coronary artery bypass graft in 2007 with a left  internal mammary artery to the left anterior descending, saphenous vein  graft to the right coronary artery, and saphenous vein graft to obtuse  marginal 1 and obtuse margin 2.  #8.  History of a combination systolic and diastolic heart failure in  the past.  #9.  Hyperlipidemia treated.  #10.  Ejection fraction 40% with inferolateral akinesis by echo  recently.  #11. Mild-to-moderate mitral regurgitation.  #12.  Severe peripheral vascular disease with a right axillary to right  femoral operation in the past.  #13.  Episode of a non-ST segment myocardial infarction with  hypertensive emergency on September 26, 2008, treated and stabilized.  #14.  No evidence of ischemia by current Cardiolite scan.   The patient has very significant medical problems.  Her cardiac status  at the  moment is stable.  With dialysis and careful attention to her  fluid, status, and blood pressure, she is stable.  She is stable for a  vascular access procedure by Dr. Hart Rochester.  I will see her back for  followup in 6 weeks and we will continue to look at her meds to see if  any adjustments are needed.     Luis Abed, MD, [facility]  Electronically Signed    JDK/MedQ  DD: 10/12/2008  DT: 10/13/2008  Job #: [id number]   cc:   Shelia Media, M.D.  [name], M.D.

## 2011-02-05 NOTE — Discharge Summary (Signed)
[name], [name] NO.:  [id number]   MEDICAL RECORD NO.:  [id number]          PATIENT TYPE:  INP   LOCATION:  2002                         FACILITY:  [facility]   PHYSICIAN:  Luis Abed, MD, FACCDATE OF BIRTH:  11-Jun-1941   DATE OF ADMISSION:  09/26/2008  DATE OF DISCHARGE:  09/28/2008                               DISCHARGE SUMMARY   She has allergies to ASPIRIN, PENICILLINS AND NSAIDS.   FINAL DIAGNOSES:  1. Admitted with dyspnea.  2. Non-ST elevation myocardial infarction (troponin I is 1.15, then      0.85, then 0.62).  3. Pulmonary edema.  Chest x-ray September 25, 2028 shows pulmonary      edema with a B-natriuretic peptide greater than 4940.  4. Hypertensive urgency with admission blood pressure 234/120.  5. A 2D echocardiogram September 26, 2008, ejection fraction 40%, mild to      moderate mitral regurgitation, hypokinesis at the base to mid      septum and mid to distal hypokinesis in the inferoposterior wall.  6. 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   SECONDARY DIAGNOSES:  1. Three-vessel coronary artery disease, status post coronary artery      bypass graft surgery 2007  2. Hypertension.  3. Diabetes mellitus.  4. Dyslipidemia.  5. End-stage renal disease, hemodialysis Tuesday, Thursday, Saturday.  6. Anemia of chronic disease.   PROCEDURES:  None this admission, except that the patient received  hemodialysis throughout her hospitalization, sufficiently to reduce her  to a euvolemic state.  She has 95% oxygen saturations on room air at  discharge.   BRIEF HISTORY:  [name] is a 70 year old female.  She has a past  medical history of coronary artery disease.  She is status post coronary  artery bypass graft surgery 2007.  She also has hypertension, diabetes,  dyslipidemia and end-stage renal disease.   The patient became acutely short of breath at about 9 in the evening  of  September 26, 2008.  She was not having chest pain.  She was not  orthopneic.  She had no expression of lower extremity edema.  Her blood  pressure was stated to be 234/120 on examination and oxygen saturation  of 81%.  Initial troponin I cardiac enzymes showed evidence of non-ST  elevation myocardial infarction.  It was felt that her pulmonary edema  and hypertensive urgency were secondary to an event of diastolic  congestive heart failure.  The patient has had medication adjustments  here at [facility], most specifically labetalol 300 mg twice daily has  been boosted to 400 mg twice daily and she has been started on Cardizem  120 mg daily.  Once again, she has had aggressive hemodialysis during  his admission.   HOSPITAL COURSE:  The patient to [facility] with pulmonary  edema, acute on chronic congestive heart failure and NSTEMI.  A 2D  echocardiogram showed ejection fraction 40%, mild to moderate mitral  regurgitation.  She was volume overloaded with an elevated BNP at 4940.  Her blood pressure required more aggressive medical therapy.  IT was  suggested that the patient undergo outpatient Cardiolite study to make  sure there was no large ischemic burden.  She will continue hemodialysis  when she goes home on an adjusted medical therapy.   Her medications at discharge are:  1. Labetalol 200 mg tablets 2 tablets every morning, 2 tablets every      evening.  2. Cardizem 120 mg daily, a new medication.  3. Clonidine 0.1 mg twice daily.  4. Isosorbide mononitrate 30 mg daily.  5. Pravachol 40 mg daily at bedtime.  6. Plavix 75 mg daily.  7. Xanax 0.5 mg three times daily.  8. Insulin NPH 30 unit every morning and 10 units at bedtime.  9. Nephrovite 1 tab daily.  10.Vicodin 10/325 one tab q.8 h.  11.Renagel 800 mg tablets 2 tabs with each meal and 2 tabs with      snacks.   At hemodialysis she receives:  1. Zemplar 13 mg IV on every hemodialysis day.  2. Aranesp 100  mcg IV on the first hemodialysis day of the week which      is a Tuesday.   FOLLOW-UP APPOINTMENTS:  1. [facility] for hemodialysis Saturday, January 9 at 9:30.  2. Stress test at [facility] Monday, January 11 at 9 o'clock.      She is asked to eat nothing after midnight Sunday, January 10.  3. She will see Dr. Myrtis Ser Wednesday, January 20 at 12:30 p.m.   She is asked to weigh herself daily and if her weight increases over a 2-  to 3-day period she is to call [phone] for help.   PERTINENT LABORATORY STUDIES THIS ADMISSION:  On the day of discharge  basic metabolic panel:  Sodium 138, potassium 4.5, chloride 106, carbon  dioxide 23, BUN 38, creatinine 5.11, glucose 89.  Complete blood count:  White cells 7.8, hemoglobin 10.1, hematocrit 32,2 and platelets of 328.  TSH this admission 0.737.  Hemoglobin A1c 6.2.   The patient has been given prescriptions for both the increased  labetalol medication and also a new one for her Cardizem.      Maple Mirza, [place]      Luis Abed, MD, [facility]  Electronically Signed    GM/MEDQ  D:  09/28/2008  T:  09/28/2008  Job:  [id number]   cc:   Selinda Flavin  [facility]  Luis Abed, MD, [facility]

## 2011-02-05 NOTE — Assessment & Plan Note (Signed)
OFFICE VISIT   [name], [name]  DOB:  1941/06/22                                       10/10/2008  ZOXWR#:[id number]   This is a new patient consultation.  [name] was referred for  vascular access.  She currently has a Diatek catheter in place on the  right side.  She has had previous coronary artery bypass grafting in  2007 and the right axilla superficial femoral bypass in 2003 by Dr.  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  She has excellent brachial and radial pulses  bilaterally on physical exam and cephalic vein on the right appears to  be better on physical exam than the left.  On the left side the cephalic  vein was located more laterally at the antecubital area and also has an  area in the mid forearm which does not fill very well.  The vein  mapping, which was done at [facility] reveals that the size of the  cephalic vein in both arms is marginal.   I think his initial plan would be to attempt a right forearm AV fistula  and we will schedule that for January 20 at [facility].  The  patient understands this may not be successful because of the size of  the vein.   [name], M.D.  Electronically Signed   JDL/MEDQ  D:  10/10/2008  T:  10/11/2008  Job:  [id number]

## 2011-02-05 NOTE — Op Note (Signed)
[name], [name]                ACCOUNT NO.:  [id number]   MEDICAL RECORD NO.:  [id number]          PATIENT TYPE:  AMB   LOCATION:  SDS                          FACILITY:  [facility]   PHYSICIAN:  Quita Skye. Hart Rochester, M.D.  DATE OF BIRTH:  07-Apr-1941   DATE OF PROCEDURE:  10/19/2008  DATE OF DISCHARGE:  10/19/2008                               OPERATIVE REPORT   PREOPERATIVE DIAGNOSIS:  End-stage renal disease.   POSTOPERATIVE DIAGNOSIS:  End-stage renal disease.   OPERATION:  1. Exploration of cephalic vein, right arm - inadequate for fistula.  2. Insertion of right forearm arteriovenous Gore-Tex graft from      brachial artery to basilic vein (4 mm - 7 mm stretch).   SURGEON:  Quita Skye. Hart Rochester, MD   FIRST ASSISTANT:  Wilmon Arms, PA   ANESTHESIA:  Local.   PROCEDURE:  The patient was taken to the operating room and placed in  the supine position at which time right upper extremity was prepped with  Betadine scrub and solution and draped in the routine sterile manner.  After infiltration of 1% Xylocaine with epinephrine, a short  longitudinal incision was made just proximal to the wrist between the  radial artery and cephalic vein.  Cephalic vein was dissected free,  ligated distally, and transected, gently dilated heparinized saline.  It  was only about 2 mm in size.  It was explored with a #3 Fogarty  catheter.  The upper arm cephalic vein was no larger than the forearm  vein and both were thought to be inadequate for fistula.  Therefore,  this wound was closed in layers with Vicryl in a subcuticular fashion.  Attention was turned to the antecubital area where a transverse incision  was made after infiltration with Xylocaine.  Brachial artery was exposed  beneath the fascia and encircled with vessel loops.  There was an  excellent pulse.  Basilic vein was 4 mm in size and was adequate for  graft.  Loop-shaped tunnel was created in the forearm.  After  infiltration with 1%  Xylocaine, a 4 x 7 mm stretch Gore-Tex graft was  delivered through the tunnel using a small counterincision of the apex  loop.  No heparin was given.  Artery was occluded proximally and  distally with vessel loops and opened with 15 blade, extended with Potts  scissors; 4-mm end of the graft was spatulated and anastomosed end-to-  side with 6-0 Prolene.  Vein was ligated distally, opened with 15 blade,  extended with Potts scissors; 7-mm end of the graft anastomosed end-to-  side with 6-0 Prolene.  Clamps were then released and there was an  excellent pulse and thrill in the graft.  The wound was irrigated with saline.  There was good pulse and thrill in  the graft and radial arterial flow with the graft open.  The wounds were  closed in layers with Vicryl in a subcuticular fashion.  Sterile  dressing was applied.  The patient was taken to the recovery room in  satisfactory condition.      [name], M.D.  Electronically Signed     JDL/MEDQ  D:  10/19/2008  T:  10/20/2008  Job:  [id number]

## 2011-02-05 NOTE — Assessment & Plan Note (Signed)
[facility]                          [facility] OFFICE NOTE   [name], [name]                   MRN:          [id number]  DATE:01/02/2009                            DOB:          16-Oct-1940    Ms. [name] is seen for followup.  We saw her last on November 30, 2008, and she is stable.  She is now on a reasonable regimen of  medications.  Her blood pressure at home runs in the range of 140  systolic.  She becomes a little anxious and it is higher when she goes  for her dialysis and she does not take her medicines that morning.  However, with dialysis she usually has a marked decrease in blood  pressure.  She is doing well today.  She continues to be active.  She is  not having any significant chest pain.  She has known coronary disease  along with her end-stage renal disease.   PAST MEDICAL HISTORY:   ALLERGIES:  PENICILLIN and ASPIRIN.   MEDICATIONS:  Renvela, Plavix, lisinopril, isosorbide, Xanax, insulin  (very infrequent), Dialyvite, metoprolol, and clonidine.   Other medical problems:  See the complete list on the note of November 30, 2008.   REVIEW OF SYSTEMS:  She has no fevers or chills.  There are no skin  rashes.  There are no headaches.  There is no change in her vision or  her hearing.  She has no chest pain or cough.  There is no shortness of  breath.  There is no GI or GU symptoms.  She has no peripheral edema.  There are no musculoskeletal problems.   Her all other problems are reviewed and are negative.   PHYSICAL EXAMINATION:  VITAL SIGNS:  Blood pressure here today is 170/78  with a pulse of 77 and weight of 128 pounds.  GENERAL:  The patient is oriented to person, time, and place.  Affect is  normal.  HEENT:  No xanthelasma.  She has normal extraocular motion.  There are  no carotid bruits.  There is no jugular venous distention.  LUNGS:  Clear.  Respiratory effort is not labored.  CARDIAC:  S1-S2.  There are  no clicks or significant murmurs.  ABDOMEN:  Soft.  She does have some ecchymoses in the arm in which her  AV fistula exists.  This is stabilizing.   Problems are listed on the note of November 30, 2008.  Blood pressure is  adequately controlled for her.  If we make it any tighter, she will not  do well.  Her coronary status is stable.  No other workup is needed at  this time.  I will see her in 3 months.     Luis Abed, MD, [facility]  Electronically Signed    JDK/MedQ  DD: 01/02/2009  DT: 01/03/2009  Job #: [id number]   cc:   [name], M.D.  [name], M.D.

## 2011-02-05 NOTE — Consult Note (Signed)
NEW PATIENT CONSULTATION   [name], [name]  DOB:  Feb 18, 1941                                       10/10/2008  ZOXWR#:[id number]   The patient is a 70 year old female with end-stage renal disease being  evaluated for vascular access.  She is currently on dialysis on Tuesday,  Thursdays and Saturdays and using a Diatek catheter through the left  internal jugular vein placed in the radiology department.  She has a  history of insulin-dependent diabetes mellitus, hypertension, coronary  artery disease with a myocardial infarction in early January of this  year but apparently had a stress test showing she was doing very well  and is planning to see Dr. Myrtis Ser tomorrow in followup.  She is right-  handed.   PREVIOUS SURGERY:  1. Coronary artery bypass grafting.  2. Axillofemoral bypass grafting on the right.  3. Hysterectomy.  4. 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   SOCIAL HISTORY:  She is married and retired.  She has 5 children.  She  has not smoked since 2000.  Does not use alcohol.   PHYSICAL EXAMINATION:  Blood pressure 174/86, heart rate 90,  respirations 14.  General:  She is alert and oriented x3.  Neck is  supple, 3+ carotid pulses palpable.  Upper extremity exam reveals  excellent brachial and radial pulses bilaterally.  Cephalic vein in the  right arm is superior to the left arm, being patent from the wrist to  the upper shoulder area.  On the left side the cephalic vein deviates  more laterally and seems to have a segment missing in the mid forearm.  She did have vein mapping at [facility]   I think the best plan for her would be an attempt at a right forearm  Cimino fistula, and we will schedule that for Wednesday, January 27,  assuming Dr. Myrtis Ser feels that she would be suitable to have surgery done  on that date.  We will plan to proceed at that time.   [name], M.D.  Electronically Signed   JDL/MEDQ  D:  10/10/2008  T:   10/11/2008  Job:  [id number]   cc:   Jorja Loa, M.D.  Selinda Flavin

## 2011-02-05 NOTE — Discharge Summary (Signed)
[name], [name] NO.:  [id number]   MEDICAL RECORD NO.:  [id number]          PATIENT TYPE:  INP   LOCATION:  4729                         FACILITY:  [facility]   PHYSICIAN:  Renee Ramus, MD       DATE OF BIRTH:  12-18-1940   DATE OF ADMISSION:  11/01/2008  DATE OF DISCHARGE:  11/02/2008                               DISCHARGE SUMMARY   PRIMARY DISCHARGE DIAGNOSIS:  Systolic heart failure exacerbation.   SECONDARY DIAGNOSES:  1. Diabetes mellitus.  2. End-stage renal disease followed by Nephrology for hemodialysis.  3. Anxiety.  4. Chronic obstructive pulmonary disease.  5. Hyperlipidemia.   HOSPITAL COURSE:  1. Systolic heart failure.  The patient is a 70 year old female who      presented with acute shortness of breath.  The patient was brought      to the emergency department where she was diagnosed as having      congestive heart failure.  She received diuresis.  She was admitted      to our service.  She also underwent hemodialysis.  Currently, the      patient says I feel really good.  She is currently asymptomatic      and wishes to go home.  The patient had an echocardiogram done on      November 2007 showing the EF of 50-55%.  She had another echo done      on September 26, 2008 showing a decreased ejection fraction at 40%.  I      believe the patient has developed some systolic congestive heart      failure.  The patient was on diltiazem and this has been      discontinued.  She has been placed on Toprol-XL and lisinopril.      The patient is not a Lasix candidate given her hemodialysis      treatments.  The patient is now being discharged.  She has      instructions to follow up with the primary care physician and her      cardiologist.  2. Diabetes.  The patient's blood sugars have been very well      controlled while in-house.  She will be continued on her      prehospital medications.  3. Hyperlipidemia.  The patient will be continued on statin  therapy      postdischarge.  4. End-stage renal disease.  The patient will continue hemodialysis.  5. Anxiety.  This is currently unstable.  6. Coronary artery disease status post coronary artery bypass graft.      The patient does have allergies to ASPIRIN and is on Plavix.  7. COPD.  This is not an acute issue at this time.   LABORATORY DATA:  Of note:  1. Normal CBC.  2. Glucose ranging from 150 to 229.  3. Renal failure with initial BUN of 64 and a creatinine of 6.66,      decreasing BUN of 27 and creatinine of 3.94.  4. Hypoalbuminemia with albumin of 2.9.  5. Initial BNP greater than 3200, decreasing to  2134 after dialysis.  6. Cholesterol panel showing HDL 38, LDL 71, total cholesterol 161      with triglycerides of 175.  7. TSH of 1.522 with a free T4 of 1.02.   The patient has been counseled respect to diet.  She is avoiding any  type of salt.  She has been counseled in respect to up regulators i.e.  caffeine.  She is stable and anxious for discharge.  There are no labs  or studies pending at the time of discharge.   TIME SPENT ON DISCHARGE:  35 minutes.      Renee Ramus, MD  Electronically Signed     JF/MEDQ  D:  11/02/2008  T:  11/03/2008  Job:  [id number]   cc:   Yehuda Mao, MD, [facility]

## 2011-02-05 NOTE — H&P (Signed)
[name], [name] NO.:  [id number]   MEDICAL RECORD NO.:  [id number]          PATIENT TYPE:  INP   LOCATION:  2314                         FACILITY:  [facility]   PHYSICIAN:  [name] Doyne, MD    DATE OF BIRTH:  [birth year]   DATE OF ADMISSION:  09/26/2008  DATE OF DISCHARGE:                              HISTORY & PHYSICAL   PRIMARY CARDIOLOGIST:  Dr. Andee [name], [name].   CHIEF COMPLAINT:  Shortness of breath, type 2 myocardial infarction.   HISTORY OF PRESENT ILLNESS:  A 70 year old white female with past  medical history significant for coronary artery disease status post  coronary artery bypass grafting in 2007, peripheral vascular disease and  end-stage renal disease on hemodialysis who presents for evaluation of  shortness of breath and elevated troponin.  The patient was in her usual  state of health this evening until 9:00 p.m.  At that time, she  developed acute shortness of breath.  There was no associated chest  pain, orthopnea, PND or lower extremity edema.  She went to an outside  [name] in [place] where she was noted to have elevated systolic blood  pressure of 234 and diastolic blood pressure of 120, and her pulses 133;  she was hypoxic satting 81% on room air.  Initial laboratory data showed  an elevated troponin.  She was treated medically with albuterol, Lasix  120 mg IV and started on nitroglycerin drip.  Currently, she is chest  pain free, is comfortable on 2 liters nasal cannula.   PAST MEDICAL HISTORY:  1. Coronary artery disease status post coronary bypass grafting 2007.      She had a four-vessel bypass with a LIMA to LAD, saphenous vein      graft to the RCA, saphenous vein graft to OM1 and saphenous vein      graft to OM2.  2. Hypertension.  3. Non-ST elevation myocardial infarction 2007.  4. Diabetes mellitus.  5. Dyslipidemia.  6. COPD.  7. History tobacco abuse.  8. Peripheral vascular disease status post right axillary  to the right      femoral bypass.  9. Incisional disease on hemodialysis since September 2009, dialyzed      on Tuesday, Thursday, Saturday.  Has no compliant with dialysis.   SOCIAL HISTORY:  The patient lives at home.  She is retired Advertising copywriter.  She did smoke 1-2 packs per day for approximately 45 years.  She denies  any alcohol or drug use.   FAMILY HISTORY:  She has a sister with end-stage renal disease.   REVIEW OF SYSTEMS:  All systems reviewed are negative as mentioned above  in history of present illness.   ALLERGIES:  1. ASPIRIN WHICH CAUSES THROAT SWELLING.  2. PENICILLUS CAUSES THROAT SWELLING.  3. NONSTEROIDAL ANTI-INFLAMMATORIES.   MEDICATIONS:  1. Lortab 10/500 mg t.i.d.  2. NPH 30 units in the morning and 10 units in the evening.  3. Sliding scale insulin.  4. Xanax 0.5 mg t.i.d.  5. Labetalol 300 mg b.i.d.  6. Plavix 75 mg daily.  7. Imdur 30 mg daily.  8. Clonidine 0.1 mg b.i.d.  9. Pravachol 40 mg daily.  10.Renagel t.i.d.  11.Nephro-Vite.   PHYSICAL EXAMINATION:  VITAL SIGNS:  Temperature afebrile.  Blood  pressure is 161/72 on a nitroglycerin drip at 20 mcg, pulse 105,  respirations 16, oxygen saturation 100% on 2 liters nasal cannula.  GENERAL:  No acute distress.  HEENT:  Normocephalic atraumatic.  Pupils equal, round and reactive to  light and accommodation.  Extraocular movements intact Oropharynx pink,  moist without lesions.  NECK:  Supple without lymphadenopathy or jugular venous distention.  No  masses.  CARDIOVASCULAR:  Tachycardic but no murmurs.  CHEST:  Bibasilar crackles halfway up the back, left IJ PermCath in  place.  ABDOMEN:  Positive bowel sounds, soft, nontender, nondistended.  EXTREMITIES:  No, cyanosis, clubbing or edema.  Dorsalis pedis pulses 2+  bilaterally.  Radial pulses 2+ bilaterally.  SKIN:  No rashes.  BACK:  No CVA tenderness.  NEUROLOGICAL:  Nonfocal.   STUDIES:  Chest x-ray from outside [name] shows bilateral  pleural  effusions with pulmonary edema.   EKG from outside [name] shows sinus tachycardia at 121 beats per  minute with normal axis and inferior lateral ST-segment depressions.   PERTINENT LABORATORY DATA:  White blood cell count 10.5, hemoglobin  11.7, platelets 466, troponin 1.44.  CK 66, CK-MB 2.6, D-dimer 2.04.  BNP is greater than 4940.   ASSESSMENT/PLAN:  A 70 year old white female with a past medical history  significant coronary artery disease status post coronary bypass  grafting, peripheral vascular disease and end-stage renal disease on  hemodialysis who presents with hypertensive urgency and subsequent flash  pulmonary edema as well as evidence of a non-ST segment elevation  myocardial infarction.   1. Admit the patient to the ICU under [facility]'s care.  She      states that her primary cardiologist is Dr. Andee [name].   1. Hypertensive urgency with subsequent flash pulmonary edema.  I      think this is most likely secondary to diastolic heart failure in      the setting of hypertensive urgency and subsequent pulmonary edema.      She did receive Lasix 120 mg IV at the outside [name].  We will      monitor urine output after receiving this dose.  It should be noted      that she is on chronic hemodialysis and she may need to have an      extra session of dialysis in the morning to remove subsequent      fluid.  In the interim time, we will acutely control her blood      pressure with nitroglycerin drip, and she is currently on this at      20 mcg.  We will titrate this to a systolic blood pressure of less      than 150.  Continue home dose of labetalol 300 mg b.i.d.  Continue      clonidine 0.1 mg b.i.d. and continue Imdur 30 mg daily.  Check      transthoracic echocardiogram  to document what her left function      is.   1. Non-ST elevation myocardial infarction.  Most likely, this a type 2      myocardial infarction with a supply/demand mismatch in the  setting      of severe hypertensive urgency.  Additionally, her end-stage renal      disease makes her elevated troponin artificially elevated higher  than what it actually is.  Certainly, it is reassuring that her CK      levels are normal.  We will need to continue to follow her cardiac      enzymes to monitor how high they go.  We will not use aspirin as      she has severe aspirin allergy.  Because of this, we will reload      her with Plavix 600 mg times one and then continue on 75 mg daily.      We will initiate the patient on systemic anticoagulation with      heparin drip for acute coronary syndrome.  We will not use a 2b3a      inhibitor as this is most likely a type 2 myocardial infarction,      and the fact that she has end-stage renal disease makes this      somewhat more high risk to use a 2b3a inhibitor.  Will continue      medical treatment with labetalol, Imdur and a statin.  Check      transthoracic echocardiogram in the morning.   1. End-stage renal disease on hemodialysis.  It will be necessary to      consult renal in the morning for evaluation and possible dialysis      on Monday.  If not, she will need to get dialyzed for routine      schedule Tuesday, Thursday and Saturday at this time.  We will      continue on Renagel and Nephro-Vite.   1. Diabetes mellitus.  Check hemoglobin A1c.  Continue NPH 30 units in      the morning, 10 units in the evening.  Sliding scale insulin      coverage as well.   1. Food/nutrition.  Saline lock IV fluids, electrolytes are stable.      N.p.o.   1. DVT prophylaxis not indicated as the patient is on systemic      anticoagulation.      [name] Doyne, MD  Electronically Signed     SJT/MEDQ  D:  09/26/2008  T:  09/26/2008  Job:  [id number]

## 2011-02-05 NOTE — Assessment & Plan Note (Signed)
[facility]                          [facility] OFFICE NOTE   [name], [name]                   MRN:          [id number]  DATE:11/30/2008                            DOB:          06-08-1941    REASON FOR VISIT:  Scheduled followup.   Since her last visit here in late January with Dr. Myrtis Ser, [name] was  briefly hospitalized at [facility] on November 01, 2008.  She presented  with shortness of breath and was found to have excess volume, per the  renal team, who proceeded with hemodialysis later that same day.  Dr.  Darrick Penna recommended increase of dialysis time, so as to decrease her  filtration rate.  Medications were also adjusted with discontinuation of  Cardizem and labetalol, and initiation of ACE inhibitor and Toprol.   The patient was cleared for discharge the following day, and now  presents in followup.  She notes significant improvement in her  breathing, and successfully underwent hemodialysis yesterday.   The patient had previously been cleared, by Dr. Myrtis Ser, to proceed with  placement of an AV graft.  This was done on October 19, 2008, by Dr.  Hart Rochester, and she is scheduled to have her Port-A-Cath taken out later  this week.   CURRENT MEDICATIONS:  1. Metoprolol 100 daily.  2. Lisinopril 5 daily.  3. Clonidine 0.1 daily.  4. Plavix.  5. Isosorbide 30 daily.  6. Xanax 0.5 t.i.d.  7. Humulin 30 units q. a.m./10 units q.p.m.   PHYSICAL EXAMINATION:  VITAL SIGNS:  Blood pressure 167/78, pulse 98,  regular, and weight 122 (unchanged).  GENERAL:  A 70 year old female sitting upright in no distress.  HEENT:  Normocephalic, atraumatic.  LUNGS:  Diminished breath sounds at bases, but without crackles or  wheezes.  HEART:  Regular rhythm at increased rate.  Positive S4.  A 2/6 systolic  ejection murmur at the base.  ABDOMEN:  Benign.  EXTREMITIES:  No edema.  NEUROLOGIC:  No focal deficit.   IMPRESSION:  1. Ischemic  cardiomyopathy.      a.     EF 40% by 2-D echocardiogram, January 2010.      b.     Status post NSTEMI, with hypertensive emergency, treated       medically, January 2010.      c.     Negative adenosine Cardiolite; EF 32%, January 2010.      d.     Status post 3v CABG in 2007.  2. End-stage renal disease, on hemodialysis.      a.     Status post atrioventricular graft, January 2010, by Dr.       Josephina Gip.  3. Hypertension, uncontrolled.  4. Moderate mitral regurgitation.  5. Severe peripheral vascular disease.  6. Insulin-dependent diabetes mellitus.  7. Dyslipidemia.  8. Chronic anemia.  9. ASPIRIN allergy, on Plavix.   PLAN:  1. Up titrate Toprol to 100 mg q.a.m./50 mg q.p.m., for management of      cardiomyopathy and elevated basal heart rate.  2. Increase clonidine to the previous dosing regimen of b.i.d.  3. Schedule early  return clinic followup with myself and Dr. Myrtis Ser in      approximately 2 weeks.      Rozell Searing, PA-C  Electronically Signed      Luis Abed, MD, [facility]  Electronically Signed   GS/MedQ  DD: 11/30/2008  DT: 12/01/2008  Job #: [id number]   cc:   Shelia Media, M.D.

## 2011-02-05 NOTE — Consult Note (Signed)
[name], COTTAM                ACCOUNT NO.:  [id number]   MEDICAL RECORD NO.:  [id number]          PATIENT TYPE:  INP   LOCATION:  4729                         FACILITY:  [facility]   PHYSICIAN:  James L. Deterding, M.D.DATE OF BIRTH:  05-21-1941   DATE OF CONSULTATION:  DATE OF DISCHARGE:                                 CONSULTATION   CONSULTING PHYSICIAN:  Renee Ramus, MD, [facility], [facility].   REASON FOR CONSULTATION:  Congestive heart failure, hyperkalemia,  anemia, hypertension.   HISTORY OF PRESENT ILLNESS:  This is a 70 year old female who is a  dialysis patient at [place], [place], who dialyzed Tuesday,  Thursday, and Saturday at [facility].  She has end-stage renal disease and  diabetes and had been on dialysis for less than a year.  Primary  nephrologist is Dr. Kristian Covey.  She was transferred from [facility] because  there was no dialysis facility at that hospital at this time.  She was  hospitalized here last night and given nitroglycerin, IV Lasix.  She had  some relief with nitroglycerin, makes a little bit of urine.   She is still short of breath.  She admits to sleeping on 3 to 4 pillows.  She has had no chest pain.  She is coughing but no phlegm production at  the current time.   PAST MEDICAL HISTORY:  Recently, 2 weeks ago, had a surgery on her right  arm for right AVG insertion by Dr. Colin Benton.  She had a non-ST-elevation MI  on September 26, 2008, and she also had pulmonary edema, history of  hypertensive urgency.  EF about 40%.  She had three-vessel CABG in 2007,  type 2 diabetes for well over 20 years, dyslipidemia, COPD.  She has had  peripheral vascular disease with a right FEM-POP, left FEM-POP, and  right Ax-FEM; secondary hypoparathyroidism; anemia; anxiety.   MEDICINES AT HOME:  Include:  1. Clonidine 0.1 b.i.d.  2. Sliding scale Humalog.  3. Labetalol 300 mg b.i.d.  4. Pravastatin 40 mg a day.  5. Renagel 2 with meals and 1 with snack.  6. Cardizem 120 mg  nightly.  7. Isosorbide mononitrate 30 mg a day.  8. NPH 30 in the a.m., 10 in the p.m.  9. Nephro-Vite one a day.  10.Xanax 0.5 mg a day.  11.Plavix 75 mg a day.   ALLERGIES:  She is allergic to ASPIRIN, PENICILLIN, and NONSTEROIDALS.   She dialyzes Tuesday, Thursday, and Saturday.  She has a left IJ Perm-A-  cath, on 11,000 EPO and 6.5 of Zemplar and receives 8 mcg of Zemplar  once a week.   SOCIAL HISTORY:  She lives with her husband at home.  She is retired.  She has 5 children.  She has had approximately 60-pack-year history and  quit in 2000.  No alcohol.   FAMILY HISTORY:  Positive for diabetes in mother who has passed away.  Her father had CHF, CVA, and hypertension and passed away.  She has a  brother with lung cancer, another with prostate cancer.  She has a  sister with colon cancer.  REVIEW OF SYSTEMS:  GENERAL:  The biggest issue is the shortness breath  and fatigue.  HEENT:  She notes some decrease in vision.  She has had  some laser surgery.  NECK:  Unremarkable.  PULMONARY:  She has had  cough, as mentioned above.  No asthma or wheezing usually.  CARDIOPULMONARY:  She does not exert heart and does not exert her lungs.  She is short of breath with moderate exertion.  GU:  She does make some  urine.  PSYCHIATRIC:  No evidence of anxiety.  No specific problems.  MUSCULOSKELETAL:  No specific complaints.  GI:  No nausea, vomiting,  diarrhea, indigestion, or heartburn.  No history of hepatitis or yellow  jaundice.   PHYSICAL EXAMINATION:  VITAL SIGNS:  Temperature 97; pulse 86;  respiratory rate 18; blood pressure 157/73, now 184/83; oxygen  saturation 96% on 2 L.  GENERAL:  She is slender, elderly female, in no acute distress, very  pleasant.  HEENT:  Dark scarring of both retinas.  Pharynx unremarkable.  NECK:  Without mass or thyromegaly.  She has posterior cervical lymph  nodes.  No significant axillary or supraclavicular nodes.  CARDIOVASCULAR:  Regular  rhythm with grade 2/6 systolic ejection murmur  best heard at the left sternal border.  Bilateral femoral bruits and  bruits in her Ax-FEM bypass.  Pulses are decreased in DP, only trace.  LUNGS:  Decreased breath sounds, decreased expansion.  Increased  dullness to percussion, crackles in both bases.  SKIN:  Atrophic changes in lower extremities.  GI:  Liver is down 3 cm, soft, nontender.  GU:  Deferred.  RECTAL:  Deferred.  EXTREMITIES:  Atrophic changes in the lower extremities.  She has AVG in  her right forearm.  Left IJ catheter.  MUSCULOSKELETAL:  She has some hypertrophic changes in PIPs, DIPs, and  MCPs.  NEUROLOGICAL:  Cranial nerves II through XII grossly intact.  She is  alert and oriented x3.  Motor is 5/5 and symmetric.   LABORATORY DATA:  Hemoglobin 14.8, white count 11.2, platelets 291,000.  Sodium 139, potassium 5.9, chloride 103, bicarbonate 26, creatinine 9.6,  BUN 58, glucose 276.   ASSESSMENT:  1. End-stage renal disease with volume excess.  Appears to be primary      volume excess, not cardiac.  She is to increase time to decrease      her filtration rate and get her volume down and that should help      her blood pressure also.  2. Anemia.  Hemoglobin is stable, we can hold her EPO at the current      time.  3. Secondary hypoparathyroidism.  Continue current medications, i.e.      vitamin D.  4. History of coronary artery disease.  She needs tight blood pressure      control, lipid control, and volume control.  5. Hyperlipidemia.  She is on nystatin.  Her lipids are pending at      this time.  6. Diabetes mellitus.  She will need tight control.  7. Hypertension.  We will decrease the volume, use some nightly meds      to facilitate dialysis.  8. Congestive heart failure.  Appears to be secondary to end-stage      renal disease.  9. Peripheral vascular disease, stable at this time status post FEM-      POP and right Ax-FEM.            ______________________________  Llana Aliment Deterding, M.D.  JLD/MEDQ  D:  11/01/2008  T:  11/02/2008  Job:  [id number]   cc:   [facility]

## 2011-02-05 NOTE — H&P (Signed)
[name], BECKSTRAND NO.:  [id number]   MEDICAL RECORD NO.:  [id number]          PATIENT TYPE:  INP   LOCATION:  4729                         FACILITY:  [facility]   PHYSICIAN:  Renee Ramus, MD       DATE OF BIRTH:  December 03, 1940   DATE OF ADMISSION:  11/01/2008  DATE OF DISCHARGE:                              HISTORY & PHYSICAL   HISTORY OF PRESENT ILLNESS:  The patient is a 70 year old female who  last p.m. had acute shortness of breath.  The patient said I just got  down watching wrestling and all of a sudden I could not breathe.  She  was taken to [facility].  There she was found to be in acute  pulmonary edema.  The patient was given a considerable amount of Lasix,  but because she is hemodialysis dependent, they transferred her to [facility].  The patient currently says I feel a lot better, is  asymptomatic and says that this is similar to recent episode she has had  including 1 in January and 1 in February 2009.  The patient is now ready  to go to dialysis.  She will be evaluated today with consideration  towards discharge in a.m.   PAST MEDICAL HISTORY:  1. Hypertension.  2. Diabetes type 1.  3. Hyperlipidemia.  4. End-stage renal disease, hemodialysis dependent.  5. Anxiety.  6. Degenerative joint disease.  7. Spondylosis.  8. Coronary artery disease, status post coronary artery bypass graft      in 2007.  9. Previous myocardial infarction in 2007.  10.COPD.  11.Peripheral vascular disease.   SOCIAL HISTORY:  No alcohol or tobacco use currently; however, the  patient does have a 45-year history of 1-2 packs per day.   FAMILY HISTORY:  Not available.   REVIEW OF SYSTEMS:  All other comprehensive review systems are negative.   ALLERGIES:  The patient has allergies NSAIDS, ASPIRIN, and PENICILLIN.   CURRENT MEDICATIONS:  1. Cardizem 120 mg p.o. daily.  2. Clonidine 0.1 mg p.o. b.i.d.  3. Isosorbide dinitrate 30 mg p.o. daily.  4.  Pravachol 40 mg p.o. daily.  5. Nephro-Vite 1 p.o. daily.  6. Renagel 800 mg 2 tablets p.o. b.i.d.  7. NPH 30 units subcu q.a.m. and 10 units subcu at bedtime.  8. Xanax 0.5 mg p.o. at bedtime p.r.n. anxiety.  9. Plavix 75 mg p.o. daily.  10.Labetalol 300 mg p.o. b.i.d.   PHYSICAL EXAMINATION:  GENERAL:  This is a well-developed, well-  nourished, white female, currently in no apparent distress.  VITAL SIGNS:  Temperature 97.8, pulse 86, respiratory rate 18, and blood  pressure 155/73.  HEENT:  No jugular venous distention or lymphadenopathy.  Oropharynx is  clear.  Mucous membranes pink and moist.  TMs clear bilaterally.  Pupils  equal and reactive to light and accommodation.  Extraocular muscles are  intact.  CARDIOVASCULAR:  She has a regular rate and rhythm without murmurs,  rubs, or gallops.  PULMONARY:  Lungs are clear to auscultation bilaterally with no evidence  of rales or rhonchi.  ABDOMEN:  Soft, nontender, and nondistended without hepatosplenomegaly.  Bowel sounds are present.  She has no rebound or guarding.  EXTREMITIES:  She has no clubbing, cyanosis, or edema.  She has good  peripheral pulses in dorsalis pedis and radial arteries.  She is able to  move all extremities.  NEUROLOGIC:  Cranial nerves II-XII are grossly intact.  She has no focal  neurological deficits.   STUDIES:  1. EKG shows sinus tach with old inferior wall MI and left ventricular      hypertrophy.  2. Echo in January 2010 shows EF of 40% with hypokinesis of the      inferior wall.   LABORATORY DATA:  Sodium 139, potassium 5.9, chloride 103, bicarb 26,  BUN 58, creatinine 9.6, and glucose 276.  White count 11.2, H&H 14.8 and  47, and MCV 291.  BNP of 1901.  No previous BNP for comparison.   ASSESSMENT AND PLAN:  1. Shortness of breath with hypertensive urgency.  Initially, her      systolic blood pressure was in the 200s, now is down to 155.  We      will stop the diltiazem and labetalol and  put her on metoprolol      with the idea of transitioning to Toprol-XL.  If the patient has      systolic heart failure, then diltiazem would be contraindicated,      and if she has a degree of heart failure that has not been      appreciated previously.  Acceptable beta-blockers would include      metoprolol, Toprol-XL, and bisoprolol.  So, we will consider      strongly Toprol-XL at discharge.  We will check serial enzymes,      recheck EKG, check chest x-ray, check BNP, check TSH and free T4.      Her flash pulmonary edema may have been secondary to volume      overload from dialysis, although she says that she is compliant.      We will have to reassess in a.m.  2. Coronary artery disease as above.  3. Diabetes mellitus.  Continue NPH and place the patient on sliding      scale insulin.  4. End-stage renal disease.  Nephrology will take for dialysis      hopefully today.  5. Anxiety.  Continue Xanax p.r.n.  6. Chronic obstructive pulmonary disease.  The patient currently has      no evidence of chronic obstructive pulmonary disease exacerbation.      I do not believe this was related to her chronic obstructive      pulmonary disease.  Therefore, we will defer treatment with      nebulizers at this time.  7. Hyperlipidemia.  We will continue Pravachol and check lipid panel.   DISPOSITION:  The patient is full code.  H&P was constructed by  reviewing literature from [place], sitting and discussing with the  patient.  Time spent 1 hour.     Renee Ramus, MD  Electronically Signed    JF/MEDQ  D:  11/01/2008  T:  11/02/2008  Job:  [id number]

## 2011-02-06 ENCOUNTER — Ambulatory Visit: Payer: Medicare Other | Admitting: Vascular Surgery

## 2011-02-06 NOTE — Assessment & Plan Note (Signed)
OFFICE VISIT  [name], [name] DOB:  1941/03/05                                       02/05/2011 ONGEX#:[id number]  Patient presents today for follow-up of her right upper arm AV fistula creation by myself on 11/30/10.  She had a right forearm loop which had occlusion of the venous outflow and subsequently underwent ligation of this by myself in creation of a right upper arm AV fistula.  She has done well since that time.  I also placed a catheter, and she is having good dialysis via the left-sided IJ catheter.  On physical exam, she does have excellent Jane Birkel maturation of her upper arm fistula.  This runs relatively superficially and has a very good size.  I feel that she should have excellent use of this.  I would wait a total of 12 weeks to allow for thickening and maturation of her right upper arm fistula since she is not having any trouble with her catheter. Should she start having catheter difficulty, I would feel comfortable using the fistula if absolutely necessary prior to June 9 but otherwise waiting until this date.  I also explained patient the critical importance of having only experienced dialysis access staff access her fistula since this is a more difficult access than graft or catheter. She understands and will see [place] again on an as-needed basis.    Larina Earthly, M.D. Electronically Signed  TFE/MEDQ  D:  02/05/2011  T:  02/06/2011  Job:  [id number]  cc:   Jorja Loa, M.D. [facility]

## 2011-02-08 NOTE — Assessment & Plan Note (Signed)
[facility]                          [facility] OFFICE NOTE   [name], [name]                         MRN:          [id number]  DATE:10/16/2006                            DOB:          1941/02/22    ADDENDUM   PLAN:  Schedule 2D echocardiogram for assessment of left ventricular  function.     Gene Serpe, PA-C  Electronically Signed    GS/MedQ  DD: 10/17/2006  DT: 10/17/2006  Job #: [id number]

## 2011-02-08 NOTE — Cardiovascular Report (Signed)
[name], [name] NO.:  [id number]   MEDICAL RECORD NO.:  [id number]          PATIENT TYPE:  INP   LOCATION:  2914                         FACILITY:  [facility]   PHYSICIAN:  Veverly Fells. Excell Seltzer, MD  DATE OF BIRTH:  1941/09/07   DATE OF PROCEDURE:  DATE OF DISCHARGE:                              CARDIAC CATHETERIZATION   DATE OF PROCEDURE:  July 25, 2006.   PROCEDURE PERFORMED:  Left heart catheterization, selective coronary  angiography.   INDICATION:  [name] is a 70 year old woman with multiple medical  problems including chronic anemia, insulin requiring diabetes, and  peripheral vascular disease who presented with a significant non S-T  elevation myocardial infarction.  She was referred for cardiac  catheterization in light of her MI.   PROCEDURAL DETAILS:  Risks and indications of the procedure were explained  in detail to the patient.  Informed consent was obtained.  The left groin  was prepped, draped, and anesthetized with 1% lidocaine.  The left femoral  artery was accessed using a modified Seldinger technique and a 6 French left  femoral sheath was placed.  Multiple angiographic views of both the left and  right coronary arteries were taken.  For the left coronary artery, a JL4  catheter was used.  For the right coronary artery, a JR4 catheter was used.  Upon selective coronary angiography, an angle pigtail catheter was inserted  into the left ventricle and ventricular pressures were recorded.  A left  ventriculogram was not performed due to the patient's renal insufficiency.  A pull back across the aortic valve was done.  At the conclusion of the  case, the patient will be transferred to the holding area for her sheath to  be removed.  Manual pressure will be used for hemostasis.   FINDINGS:  Aortic pressure 146/56 with a mean of 90.  Left ventricular  pressure 147/4 with an end diastolic pressure of 13.   The left main stem has non  obstructive disease.  There is a 30% ostial  lesion present.  There is very mild pressure damping with the catheter in  the left main.  The left main stem bifurcates into the LAD and left  circumflex.  The LAD is heavily calcified in its proximal segments.  There  is an 80% proximal stenosis.  The remainder of the LAD is free of any  significant angiographic disease.  There are 2 medium caliber diagonal  branches that do not have any significant angiographic disease.   The left circumflex is large diameter.  It is mildly calcified in its  proximal portion.  The mid left circumflex has an 80% focal stenosis.  There  is an obtuse marginal coming out of this area that has a 70% ostial  stenosis.  The obtuse marginal is medium caliber.  The true circumflex  courses down the LV groove and gives off a large left posterolateral branch  that is angiographically normal.   The right coronary artery is heavily calcified.  Its mid portion has a long  area of serial 95% stenoses that are very high  grade and the proximal most  lesion appears ulcerated.  The distal right coronary and its terminal  branches appear angiographically normal.  Distally, the right coronary  artery bifurcates into the PDA and posterior AV segment which gives off 2  small posterolateral branches.   ASSESSMENT:  1. 3 vessel coronary artery disease.  2. Normal left ventricular filling pressures.  3. No left ventriculogram performed secondary to chronic renal      insufficiency.  There will be an echocardiogram performed to assess the      patient's LV function.   PLAN:  I think in the setting of the patient's longstanding diabetes, she  should be offered coronary bypass surgery.  If she is felt to be a  reasonable candidate, I think that would be her best long term  revascularization strategy.  While her lesions are all amenable to  percutaneous intervention, I think with multiple vessels involved, and  chronic renal  insufficiency, she would be better served with bypass.  I  believe her culprit vessel is her right coronary artery.  6 hours after her  sheath is pulled, she will be restarted on Heparin.  We will hold her  Plavix.  She has only received 2 doses of 75 mg of Plavix.  She has a true  ASPIRIN ALLERGY.  After bypass, she should be on Plavix long term.      Veverly Fells. Excell Seltzer, MD  Electronically Signed     MDC/MEDQ  D:  07/25/2006  T:  07/26/2006  Job:  [id number]   cc:   Learta Codding, MD,FACC  Selinda Flavin

## 2011-02-08 NOTE — H&P (Signed)
[place]  Patient:    [name], [name] Visit Number: [id number] MRN: [id number]          Service Type: SUR Location: 2000 2029 01 Attending Physician:  Alyson Locket Dictated by:   Adair Patter, P.A. Admit Date:  03/16/2002 Discharge Date: 03/24/2002                           History and Physical  CHIEF COMPLAINT:  Peripheral vascular disease.  HISTORY OF PRESENT ILLNESS:  This is a 70 year old female who had a femoral-femoral bypass for peripheral vascular disease.  This graft subsequently became infected and required removal.  The patient now presents for a right lower extremity bypass.  Currently she reports pain from her right buttock to her right hip that radiates down to her right calf and right foot. She denies any rest pain but does state that occasionally this pain wakes her up from sleep.  She has no gangrenous changes, slow-healing ulcers, or ischemic changes in her lower extremities.  PAST MEDICAL HISTORY: 1. Peripheral vascular disease. 2. Hyperlipidemia. 3. Insulin-dependent diabetes mellitus. 5. Degenerative joint disease.  PAST SURGICAL HISTORY: 1. Hysterectomy. 2. Facial surgery. 3. Removal of infected femoral-femoral bypass graft. 4. Femoral-femoral bypass graft.  MEDICATION ALLERGIES:  PENICILLIN  MEDICATIONS: 1. Vicodin p.r.n. pain. 2. Humulin insulin 40 units in the a.m. and 20 units in the p.m. 3. Xanax 0.25 mg p.o. q.h.s.  FAMILY HISTORY:  The patients mother had diabetes mellitus and cancer.  Her father had CHF, CVA, and hypertension.  She had a sister with colon cancer and diabetes mellitus.  She had a brother with prostate cancer and another brother with lung cancer.  There is no family history of myocardial infarction.  SOCIAL HISTORY:  The patient is married and has five children and lives with her husband.  She denies any alcohol use.  She quit smoking in 2000 but smoked one to two packs of  cigarettes per day for 42 years.  REVIEW OF SYSTEMS:  GENERAL:  The patient denies any fevers, chills, night sweats, or frequent illnesses.  HEAD:  The patient denies any head injuries or loss of consciousness.  EYES:  The patient denies any glaucoma or cataracts. EARS:  The patient denies any tinnitus, vertigo, hearing loss, or ear infections noted.  The patient denies any epistaxis, rhinitis, or sinusitis. MOUTH:  The patient denies any problems with dentition or frequent sore throats.  NECK:  The patient denies any lumps, masses, or change of range of motion in her neck.  CARDIOVASCULAR:  The patient denies any cardiac arrhythmias or hypertension.  PULMONARY:  The patient denies any asthma, bronchitis, emphysema, or pneumonia.  GASTROINTESTINAL:  The patient denies any nausea, vomiting, diarrhea, constipation, hematochezia, or melena. GENITOURINARY:  The patient denies any urinary tract infections or urinary incontinence.  ENDOCRINE:  The patient has diabetes, which is treated with insulin.  She denies thyroid disease.  MUSCULOSKELETAL:  The patient denies any arthritis, arthralgias, or myalgias.  NEUROLOGIC:  The patient denies any strokes, memory loss, transient ischemic attack, or depression.  PHYSICAL EXAMINATION:  VITAL SIGNS:  Blood pressure is 140/80, pulse is 84 and regular, respirations are 16.  GENERAL:  The patient is alert and oriented x3, in no distress.  HEENT:  Head is atraumatic, normocephalic.  Eyes:  Pupils equal, round and reactive to light and accommodation.  Extraocular motions are intact, without scleral icterus or nystagmus.  Ears:  Auditory acuity is grossly intact. Nose:  The nares are patent and intact, and the sinuses are nontender.  Mouth is moist without exudates.  NECK:  Supple without JVD, lymphadenopathy, carotid bruits, or thyromegaly.  CHEST:  Clear bilaterally to auscultation without rales, rhonchi, or wheezes.  CARDIAC:  Regular rate and  rhythm without murmurs, gallops, or rubs.  ABDOMEN:  Soft, nontender, nondistended, positive bowel sounds in all four quadrants.  EXTREMITIES:  No cyanosis, clubbing, or edema.  There were no nonhealing ulcers on either foot.  VASCULAR:  Peripheral pulse exam revealed 2+ carotid and femoral pulses bilaterally.  She has a 2+ popliteal, dorsalis pedis, and 1+ posterior tibial pulse on her left foot.  She had no palpable popliteal or pedal pulses on her right foot.  NEUROLOGIC:  Cranial nerves II-XII are grossly intact.  Muscle strength was 5+ and equal in the extremities.  IMPRESSION:  Peripheral vascular disease.  PLAN:  The patient will undergo a right axillofemoral bypass graft by Larina Earthly, M.D. Dictated by:   Adair Patter, P.A. Attending Physician:  Alyson Locket DD:  04/15/02 TD:  04/19/02 Job: [id number]

## 2011-02-08 NOTE — Assessment & Plan Note (Signed)
[facility]                          [facility]   PETER, [name]                         MRN:          [id number]  DATE:10/17/2006                            DOB:          1941-03-09    PRIMARY CARDIOLOGIST:  Dr. Vernie Shanks. DeGent   REASON FOR VISIT:  Scheduled office followup.  Please refer to Dr.  Margarita Mail clinic note of December 21 for full details.   The patient reports significant improvement in her overall exercise  tolerance.  She has much less shortness of breath and reports no PND,  orthopnea or significant lower extremity edema.   Since last seen, the patient reports having taken herself off Plavix  because she cannot afford it.  She was on it for one month, per her  report.   The patient reports improvement of her blood pressure.  However, she  reports that she has readings at home of 170-180 systolic/90 diastolic.   The patient has not smoked tobacco since 2002.   The patient had followup chest x-rays performed earlier this morning for  reassessment of a postoperative left pleural effusion.  These were  reviewed, and showed some persistent layering in the left lateral  decubitus position.   CURRENT MEDICATIONS:  1. Pravastatin 40 daily.  2. Clonidine 0.1 b.i.d.  3. Labetalol 300 b.i.d.  4. Humulin N 40 units q.a.m./20 units q. noon/55 units q.h.s.  5. Humulin R sliding scale.  6. Xanax 0.5 q.h.s.   PHYSICAL EXAMINATION:  Blood pressure 168/88, pulse 84, regular, weight  128 (up 4 pounds).  GENERAL:  The patient is a 70 year old female, sitting upright, in no  distress.  HEENT:  Normocephalic, atraumatic.  NECK:  Palpable bilateral carotid pulses without bruits.  No JVD.  LUNGS:  Diminished breath sounds in the left, no crackles or wheezes.  HEART:  Regular rate and rhythm (S1, S2).  No significant murmur.  ABDOMEN:  Soft, flat, nontender.  EXTREMITIES:  Trace pedal edema.  NEUROLOGIC:  No focal  deficit.   REVIEW OF SYSTEMS:  No chest pain, otherwise as per HPI, with remaining  systems negative.   IMPRESSION:  1. Three-vessel coronary artery disease.      a.     Non-ST-elevation myocardial infarction/emergent four-vessel       CABG:  LIMA-LAD, SVG-RCA, SVG-OM1-OM2, November 2007.      b.     Postoperative left pleural effusion.  2. COPD.      a.     Remote tobacco.  3. Insulin-dependent diabetes mellitus.  4. Hyperlipidemia.  5. Chronic renal insufficiency.  6. Peripheral vascular disease.      a.     Status post right axilla-femoral bypass graft, 2003.  7. ASPIRIN ALLERGY.   PLAN:  1. Up-titrate clonidine to 0.2 b.i.d. for better blood pressure      control.  2. Check fasting lipids/liver profile in two months.  3. Will refer patient for social worker assessment for assistance in      providing Plavix, secondary to severe financial constraints.  4. Return clinic followup with myself and Dr.  DeGent in two months.      Gene Serpe, PA-C  Electronically Signed      Learta Codding, MD,FACC  Electronically Signed   GS/MedQ  DD: 10/17/2006  DT: 10/17/2006  Job #: [id number]   cc:   Selinda Flavin

## 2011-02-08 NOTE — Assessment & Plan Note (Signed)
[facility]                          [facility]   [name], [name]                         MRN:          [id number]  DATE:09/12/2006                            DOB:          1941/06/27    HISTORY OF PRESENT ILLNESS:  The patient is a 70 year old female status  post non-ST elevation myocardial infarction and coronary artery bypass  grafting.  The patient also has severe peripheral vascular disease.  The  patient was last seen in the office on August 15, 2006 post bypass.  She was mildly short of breath.  She was noted to have a moderate size  left pleural effusion.  She now presents for followup and a repeat chest  x-ray done.  It was noted for the persistence of the left pleural  effusion, all be it somewhat smaller.  The patient states that she has  improved since our last office visit, but unfortunately her blood  pressure is still markedly elevated despite increasing labetalol to 300  b.i.d.   MEDICATIONS:  1. Labetalol 300 mg p.o. b.i.d.  2. __________.  3. Xanax  4. Plavix 75 mg daily.  5. Vytorin 10/80 mg p.o. daily.  6. Simvastatin 40 mg daily.   The patient states that she is unable to afford her medications and has  run out of Plavix as well as Vytorin.  She will get her Medicare card  January 1.   PHYSICAL EXAMINATION:  VITAL SIGNS:  Blood pressure is 180/108 with a  heart rate of 88 beats per minute.  GENERAL:  Well-nourished white female somewhat pale appearing.  HEENT:  Pupils equal.  NECK:  Supple, normal carotid Doppler stroke.  LUNGS:  Diminished breath sounds on the left side, approximately half  way up.  HEART:  Regular rate and rhythm, normal S1, S2, there is no murmurs.  ABDOMEN:  Soft.  EXTREMITIES:  No edema.   PROBLEM LIST:  1. Coronary artery disease.      a.     Status post non-sinus tachycardia elevation myocardial       infarction.      b.     Status post coronary bypass grafting.  2.  Peripheral vascular disease status post right axilla to right      femoral bypass in 2003.  3. Post operative left pleural effusion.  4. Type 2 diabetes mellitus.  5. Hypertension, uncontrolled.  6. Remote severe heavy tobacco use.  7. Chronic obstructive pulmonary disease.  8. Dyslipidemia.  9. Renal insufficiency, currently stable at 1.9.  10.History of acute blood loss.  11.History of mild acute tubular necrosis, and possibly renal tubular      acidosis, potassium is 5.1.   PLAN:  1. The patient was unable to afford her Plavix and Vytorin.  We will      give her prescription for Zocor generic.  The patient will get      Plavix refilled.  2. The patient will follow up with [place] in 1 month and we will recheck      her chest x-ray posteroanterior and lateral with left  decbutis. If      the pleural effusions persist, we may want to consider      thoracentesis.  3. Renal function has been stable.  4. I have added clonidine to her medical regimen for improved control      of blood pressure.   Of note is also that the patient during next visit needs a 2D  echocardiographic study to evaluate her left ventricular function.     Learta Codding, MD,FACC  Electronically Signed    GED/MedQ  DD: 09/12/2006  DT: 09/13/2006  Job #: [id number]

## 2011-02-08 NOTE — Op Note (Signed)
[place]  Patient:    [name], [name] Visit Number: [id number] MRN: [id number]          Service Type: SUR Location: 2000 2029 01 Attending Physician:  Alyson Locket Dictated by:   Larina Earthly, M.D. Proc. 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  PROCEDURES: 1. Removal of infected left-to-right femoral-femoral bypass. 2. Debridement of abdominal wall. 3. Bilateral vein patch angioplasty of common femoral artery.  SURGEON:  Larina Earthly, M.D.  ASSISTANTS:  Di Kindle. Edilia Bo, M.D., and Tollie Pizza. Collins, P.A.-C.  ANESTHESIA:  General endotracheal.  COMPLICATIONS:  None.  DISPOSITION:  To recovery room stable.  INDICATION FOR PROCEDURE:  The patient is a 70 year old white female status post left-to-right femoral-femoral bypass with Hemashield graft on Feb 08, 2002.  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  She did well and was discharged to home on May 21. She was seen in the office on May 12 with well-healed groin incisions and excellent distal pulses.  She had no evidence of infection.  She did complain of some usual soreness over her incisions with panty lines.  She was reassured and was instructed to notify should she develop any difficulties.  She presents to our office today with extensive lower abdominal necrotizing infection with complete areas of necrosis on her abdominal wall above the graft.  She had some drainage from her groin incision on the right.  The left groin incision was completely healed but did have necrotizing drainage over the entire anterior abdominal wall in the area above the femoral-femoral loop. The patient reports that she was out of  town and first noted pus draining from these areas four days ago, did not present until now.  She denies any fever or chills and denies any significant pain.  She was explained that she needed to be taken immediately to the operating room for removal of her infected prosthetic graft and debridement of this area and IV antibiotics.  It was explained that she would be back to her baseline claudication with vein patch angioplasty only and no bypass during the grossly infected field.  DESCRIPTION OF PROCEDURE:  The patient was taken to the operating room and placed in the supine position, where the area of the abdomen and both groins was prepped and draped in the usual sterile fashion.  Incisions were made through the prior groin incisions and carried down to isolate the common femoral arteries bilaterally proximal and distal to the femoral-femoral bypass bilaterally.  The left greater saphenous vein was identified at the saphenofemoral junction and was dissected through the same incision in the proximal thigh.  The vein was harvested for vein patch.  The patient was given 6000 units of intravenous heparin.  After adequate circulation time, the femoral-femoral graft and the right common femoral artery was occluded proximal and distal to the anastomosis.  The right femoral anastomosis was taken down and the artery was intact.  The vein was opened, spatulated, and was sewn into a patch angioplasty with a running 5-0 Prolene suture.  Prior to completion of the anastomoses, flushing maneuvers were undertaken and the anastomosis was then completed.  Next, similarly the  left common femoral artery was occluded proximal and distal to the old incision and the graft was then removed in its entirety.  Again a vein patch angioplasty was sewn on the left common femoral artery.  Clamps were removed and good flow was noted distally.  The wounds were debrided in the groins and irrigated with antibiotic  saline.  The patient had multiple areas of total skin disruption over the entire lower abdominal wall over the prior course of the femoral- femoral bypass.  There was a great deal of necrotic debris beneath these as well.  These areas were debrided of any necrotic tissue both of skin and subcutaneous tissue.  This was then irrigated with antibiotic saline as well. The groin wounds were closed with a single layer in the subcutaneous tissue of 2-0 Vicryl sutures.  The skin was left open.  This was packed with Betadine- soaked Kerlix, and the open lesions on the abdomen were packed with Betadine- soaked 4 x 4s as well.  The patient tolerated the procedure without immediate complication and was transferred to the recovery room, extubated, hemodynamically stable. Dictated by:   Larina Earthly, M.D. Attending Physician:  Alyson Locket DD:  03/16/02 TD:  03/18/02 Job: [id number]

## 2011-02-08 NOTE — Discharge Summary (Signed)
[name], [name] NO.:  [id number]   MEDICAL RECORD NO.:  [id number]          PATIENT TYPE:  INP   LOCATION:  2008                         FACILITY:  [facility]   PHYSICIAN:  [name]. [name], M.D.DATE OF BIRTH:  21-Jan-1941   DATE OF ADMISSION:  07/24/2006  DATE OF DISCHARGE:  08/02/2006                                 DISCHARGE SUMMARY   ADMISSION DIAGNOSIS:  Mycoardial infarction.   PAST MEDICAL HISTORY AND DISCHARGE DIAGNOSES:  1. Peripheral vascular disease with previous right femoral bypass graft      with graft having to be removed subsequently due to infection and      subsequent right axillary to right femoral bypass by Dr. Colin Benton in 2003.  2. Type 2 diabetes mellitus.  3. Remote heavy tobacco abuse.  4. COPD.  5. Hypertension.  6. Hyperlipidemia.  7. Renal insufficiency with baseline creatinine of 1.8.  8. Three-vessel coronary artery disease status post coronary artery bypass      grafting x4 emergently.  9. Acute blood loss anemia, currently stable.  10.Mild acute tubular necrosis postoperatively, resolving.   ALLERGIES:  1. ASPIRIN WHICH CAUSES THROAT EDEMA.  2. PENICILLIN.  3. DARVOCET.   BRIEF HISTORY:  The patient is a 70 year old Caucasian female with a history  significant for diabetes, hyperlipidemia and tobacco abuse who presented to  the emergency room at [facility] after two episodes of diaphoresis, dizziness  and chest discomfort.  She was ruled in for a non-Q wave myocardial  infarction with a troponin of 12.6.  Of note, on admission to the emergency  room her blood glucose was noted to be 600.  She was then transferred to  [name] for further evaluation and elective cardiac cath.   HOSPITAL COURSE:  The patient was admitted on July 24, 2006 via transfer  from [place] secondary to non-Q wave myocardial infarction and need of  cardiac cath.  Patient was in stable condition at that time and taken for  cardiac cath  on July 25, 2006 which revealed severe three-vessel coronary  artery disease with normal left ventricular filling pressures.  Secondary to  these findings, Dr. Charlett Lango of the CVTS service was consulted  regarding surgical revascularization.  He evaluated the patient on July 25, 2006 and it was his opinion the patient should proceed with coronary  artery bypass grafting that was then scheduled for the following Tuesday.  The patient was maintained on routine hospital care status post cardiac  cath, however over the next day and evening, she continued to have chest  pain that was unrelieved with medications and; therefore, she was taken  emergently to the OR on July 27, 2006 for coronary artery bypass grafting  x4.  The left internal mammary artery was grafted to the LAD, composite  saphenous vein was grafted to the right coronary, and sequential saphenous  vein was grafted to the obtuse marginals 1 and 2.  Vein was harvested from  the bilateral lower extremities with a combination of open and endoscopic  technique.  The patient tolerated the procedure well and was  hemodynamically  stable immediately postoperatively.  She was transferred from the OR to the  SICU in stable condition.  Patient was extubated without complication and  woke up from anesthesia neurologically intact.   The patient's postoperative course has progressed as expected.  On  postoperative day one, she was afebrile with stable vital signs and  maintaining normal sinus rhythm.  Her only complaint was of nausea.  All  basic wires and chest tubes were discontinued in a routine manner and she  tolerated this well.  She has been volume overloaded postoperatively and has  been diuresed accordingly.  Her creatinine did trend upward over the next  several days throughout the postoperative course and this was thought to be  due to not only her known chronic renal insufficiency, but also the  possibility of  acute tubular necrosis secondary to CABG.  This has been  followed and is currently stable only mildly elevated above her baseline.   She was also noted to have an acute and chronic anemia throughout the  postoperative course and this has subsequently stabilized.  Patient began  cardiac rehab on postoperative day one and has continued to increase her  tolerance to a satisfactory level at this time.  The remainder of the  patient's postoperative course was dedicated to blood pressure control,  cardiac rehab and pulmonary toilet.  Her diabetes mellitus has been  maintained throughout the postoperative course with Lantus insulin.  She  will be discharged home on Lantus despite the fact that she was on Humatin  prior to admission secondary to the fact that her admission hemoglobin A1c  was 9.1.  Her Lantus will be continued until such time that she follows up  with her primary care physician regarding her diabetes mellitus and further  medication changes.   On postoperative day five, the patient is without complaint, she is afebrile  with stable vital signs and maintaining normal sinus rhythm.  She remains  volume overloaded and is continuing to be diurese.  On physical exam,  cardiac is regular rate and rhythm, lungs reveal decreased breath sounds in  the bases, the abdomen is benign, the incisions are clean, dry and intact  and there is no edema present in the bilateral lower extremities.  Her  creatinine is noted to be slightly elevated today and this will be closely  monitored.  She is to continue rehab and work on her pulmonary toilet.  She  is in stable condition at this time and as long her creatinine remains  stable she will be ready for discharge home within the next one to two days  pending morning round reevaluation.   LABS:  BMP on August 01, 2006:  Sodium 141, potassium 4.5, BUN 38,  creatinine 2.4, glucose 92.  CBC on July 31, 2006:  Hemoglobin 9.3, hematocrit 27.3, white  count 8.9, platelets 143.   CONDITION ON DISCHARGE:  Improved.   MEDICATIONS:  1. Labetalol 200 mg b.i.d.  2. Vytorin 10/80 mg q.day.  3. Plavix 75 mg q.day.  4. Lantus insulin 20 units q.h.s.  5. Xanax 0.5 mg q.h.s. p.r.n.  6. Vicodin 5/500 mg one to two q.6.h. p.r.n. pain.   INSTRUCTIONS:  The patient received specific written instructions regarding  activity, diet and wound care.   FOLLOWUP APPOINTMENTS:  1. With Dr. Riley Kill.  The [facility] Cardiology Service is responsible for      establishing this appointment for the patient two weeks after discharge      at  which time a PA and lateral chest x-ray will be taken.  2. With Dr. [name] on September 05, 2006 at 12:00 p.m.      [name], PA    ______________________________  [name] [name], M.D.    AY/MEDQ  D:  08/01/2006  T:  08/02/2006  Job:  [id number]   cc:   Arturo Morton. Riley Kill, MD, [facility]

## 2011-02-08 NOTE — Discharge Summary (Signed)
[place]  Patient:    [name], [name] Visit Number: [id number] MRN: [id number]          Service Type: SUR Location: 2000 2021 01 Attending Physician:  Alyson Locket Dictated by:   Maxwell Marion, RNFA Admit Date:  02/08/2002 Discharge Date: 02/10/2002   CC:         Selinda Flavin, M.D., [place], [place].   Discharge Summary  DATE OF BIRTH:  [birth year]  ADMISSION DIAGNOSIS:  Right lower extremity ischemia.  PAST MEDICAL HISTORY: 1. Diabetes mellitus type 1. 2. 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 3. Hyperlipidemia.  SURGICAL HISTORY:  Status post hysterectomy 1987 and facial surgery in 2000.  ALLERGIES:  PENICILLIN and ASPIRIN both cause swelling and shortness of breath.  DISCHARGE DIAGNOSIS:  Right lower extremity ischemia, status post cross femoral bypass.  BRIEF HISTORY:  Mrs. [name] is a 70 year old, Caucasian female referred to CVTS for evaluation of her right leg numbness and pain.  She was evaluated by Dr. Arbie Cookey on Jan 28, 2002.  On physical exam, she was noted to have absent right femoral and distal pulses.  ABIs from [facility] were 0.24 on the right and 0.81 on the left.  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 Arbie Cookey recommended angiography.  This was performed on Feb 03, 2002.  Her lesions were not amenable to angioplasty and so he recommended surgical revascularization. Procedure, risks and benefits were discussed with the patient and she agreed to proceed.  HOSPITAL COURSE:  On Feb 08, 2002 Mrs. Ring was electively admitted to [facility] under the care of Dr. Tawanna Cooler Early.  She underwent the following surgical procedure:  Right to left femoral bypass with an 8 mm Hemashield graft.  She tolerated this procedure well and was transferred in stable condition to the PACU.  She has remained hemodynamically stable since her surgery and her postoperative course has been  uneventful.  The morning of postoperative day one, Mrs. [name] is feeling well.  Her vital signs are stable.  She is afebrile.  She is tolerating a regular diet.  Her incisions are clean and dry.  She has palpable distal pulses bilaterally.  Plan is to increase her mobilization and if she remains stable, anticipate she will be ready for discharge home tomorrow, Feb 10, 2002.  CONDITION ON DISCHARGE:  Improved.  DISCHARGE INSTRUCTIONS:  ACTIVITY: She has been asked to refrain from any heavy lifting, pushing, or pulling.  She is instructed to continue her breathing exercises and daily walking.  DIET:  Diabetic diet.  WOUND CARE:  She may shower Thursday, Feb 11, 2002.  Until then she is to clean incisions daily with mild soap and water.  She has been instructed to examine her incisions daily.  If they become red, hot, swollen, drain or if she has a fever greater than 101 degrees Fahrenheit, she is instructed to call Dr. Nicholes Mango office.  DISCHARGE MEDICATIONS: She has been instructed to consume her home medications including: 1. Lantus Insulin subcutaneous 24 units q.h.s., sliding scale regular insulin    coverage. 2. Xanax 0.25 mg p.o. q.h.s. 3. Lortab 10/500 one p.o. q.h.s. 4. Darvocet N 100 one p.o. b.i.d.  FOLLOW-UP:  She has an appointment at the CVTS Office to see Dr. Arbie Cookey on March 04, 2002 at 11:00.  She will have repeat ABIs done at that time. Dictated by:   Maxwell Marion, RNFA Attending Physician:  Alyson Locket DD:  02/09/02  TD:  02/11/02 Job: [id number]

## 2011-02-08 NOTE — Assessment & Plan Note (Signed)
[facility]                            [facility] OFFICE NOTE   [name], [name]                         MRN:          [id number]  DATE:08/15/2006                            DOB:          19-Dec-1940    REFERRING PHYSICIAN:  Dr. Dimas Aguas   INDICATION:  The patient is a 70 year old female with multiple medical  problems including chronic anemia, insulin-requiring diabetes, and  peripheral vascular disease.  The patient presented with significant non-ST  elevation myocardial infarction.  She was referred for cardiac  catheterization.  She was found to have multi-vessel coronary artery  disease, albeit with normal LV function.  The patient has a history of  chronic renal insufficiency.  A cardiac catheterization was performed on  Friday, she then developed unstable symptoms.  On Sunday she was taken  emergently to bypass surgery.  The patient is now status post coronary  artery bypass grafting.  She has no recurrence of chest pain, shortness of  breath, orthopnea, PND.  She has no palpitations or syncope.  She appears to  be doing reasonably well following surgery.  She had a chest x-ray done  which showed a left pleural effusion.  She, however, has had no shortness of  breath __________  exertion.   MEDICATIONS:  1. Labetalol 200 mg p.o. b.i.d.  2. Vytorin 10/80 mg p.o. every day.  3. Plavix 75 mg a day.  4. Xanax.  5. Humulin insulin.   PHYSICAL EXAMINATION:  VITAL SIGNS:  Blood pressure is 162/78, heart rate is  84 beats a minute, weight is 124 pounds.  NECK:  Normal carotid upstrokes.  No carotid bruits.  LUNGS:  Clear breath sounds but decreased breath sounds on the left side.  HEART:  Regular rate and rhythm.  Normal S1 S2.  No pathological murmurs.  ABDOMEN:  Soft.  EXTREMITIES:  No cyanosis, clubbing, or edema.  The venous harvest graft  sites appear to be healing well.   PROBLEM LIST:  1. Status post non-ST elevated myocardial  infarction.  2. Coronary bypass grafting x4.  3. Peripheral vascular disease, status post right axilla to right femoral      bypass in early 2003.  4. Type 2 diabetes mellitus.  5. Remote heavy tobacco use.  6. Chronic obstructive pulmonary disease.  7. Hypertension.  8. Dyslipidemia.  9. Renal insufficiency.  10.History of acute blood loss.  11.History of mild acute tubular necrosis.   PLAN:  1. The patient will need to have followup laboratory work done.  This will      be done in the next couple of weeks.  She is trying to get insurance in      the meanwhile.  I do not think there is any emergency to obtain the      labs and this will be deferred until the next few weeks.  2. The patient will need an echocardiographic study to evaluate her LV      function.  3. The patient needs better blood pressure control and I have increased      her labetalol  to 300 mg p.o. b.i.d.  4. The patient has a left pleural effusion.  This needs to be followed and      a repeat chest x-ray will be done in the next couple of weeks.  She      appears to be relatively asymptomatic and this can be followed      clinically.  5. The patient will follow up with me in the next couple of weeks.      Consideration will need to be given to ACE inhibitor with close      following of her creatinine, if indeed she has left ventricle      dysfunction.     Learta Codding, MD,FACC  Electronically Signed    GED/MedQ  DD: 08/15/2006  DT: 08/15/2006  Job #: [id number]

## 2011-02-08 NOTE — Op Note (Signed)
[place]  Patient:    [name], [name] Visit Number: [id number] MRN: [id number]          Service Type: SUR Location: 2000 2029 01 Attending Physician:  Alyson Locket Dictated by:   Larina Earthly, M.D. Proc. Date: 04/19/02 Admit Date:  03/16/2002 Discharge Date: 03/24/2002   CC:         Era Bumpers, MD   Operative Report  PREOPERATIVE DIAGNOSIS:  Right foot ischemia.  POSTOPERATIVE DIAGNOSIS:  Right foot ischemia.  OPERATION PERFORMED:  Right axillary to superficial femoral artery bypass with 8 mm standard wall stretch intraring Gore-Tex graft.  SURGEON:  Larina Earthly, M.D.  ASSISTANT: 1. Quita Skye. Hart Rochester, M.D. 2. Tollie Pizza. [name], P.A.  ANESTHESIA:  General endotracheal.  COMPLICATIONS:  None.  DISPOSITION:  To recovery room stable.  DESCRIPTION OF PROCEDURE:  The patient was taken to the operating room and placed in supine position where the area of the right and left groins and right axilla and chest were prepped and draped in the usual sterile fashion. The patient had a prior left to right femoral-femoral bypass with extensive infection of this with removal on June 24.  She continues to have persistent ischemia of her right foot.  She was taken to the operating room at this time for revascularization.  The incision was made below the level of the clavicle and carried down through the pectoralis major muscle.  The axillary vein and axillary artery were isolated.  Tributary branches of the axillary vein were ligated and divided.  The axillary artery was inserted with a vessel loop and was minimally atherosclerotic.  A separate incision was made then below the level of the prior femoral incision on the proximal thigh over the superficial femoral artery.  The superficial femoral artery was isolated, was of moderate size and had again, minimal calcification present.  A tunnel was created from the thigh incision lateral  to the prior groin incision over the lateral chest up to the level of the axillary artery under the pectoralis major muscle.  An 8 mm intraring Gore-Tex stretch graft was brought through the tunnel.  The patient was given 7000 units of intravenous heparin.  After adequate circulation time the axillary artery was occluded proximally and distally with baby Gregory clamps.  The artery was opened with the 11 blade and extended longitudinally with Potts scissors.  The graft was slightly spatulated and sewn end-to-side to the artery with a running 6-0 Prolene suture.  The anastomosis was tested and found to be adequate and was flushed.  The graft itself was then flushed with heparinized saline and reoccluded.  Next, the superficial femoral artery was occluded proximally and distally, opened with an 11 blade and extended longitudinally with Potts scissors.  The graft was cut to appropriate length, was spatulated and sewn end-to-side to the artery with a running 6-0 Prolene suture.  Prior to completion of the anastomosis the usual flushing maneuvers were undertaken.  The anastomosis was completed and clamps were removed.  A good flow was noted to the graft with pulsation.  The patient was given 50 mg of protamine to reverse the heparin.  The wounds were irrigated with saline.  Hemostasis was obtained with electrocautery.  The wounds were closed with several layers of 2-0 Vicryl in the subcutaneous tissues.  The skin was closed with 3-0 subcuticular Vicryl stitch.  A sterile dressing was applied and the patient was taken to the recovery room in stable  condition. Dictated by:   Larina Earthly, M.D. Attending Physician:  Alyson Locket DD:  04/19/02 TD:  04/20/02 Job: [id number]

## 2011-02-08 NOTE — Discharge Summary (Signed)
NAMEJENNFER, [name]                            ACCOUNT NO.:  [id number]   MEDICAL RECORD NO.:  [id number]                   PATIENT TYPE:  INP   LOCATION:  2041                                 FACILITY:  [facility]   PHYSICIAN:  Larina Earthly, M.D.                 DATE OF BIRTH:  [birth year]   DATE OF ADMISSION:  04/19/2002  DATE OF DISCHARGE:  04/22/2002                                 DISCHARGE SUMMARY   PRIMARY ADMITTING DIAGNOSES:  1. Ischemic right lower extremity.  2. History of peripheral vascular disease, status post left to right,     femoral to femoral bypass graft by Dr. Arbie Cookey.   ADDITIONAL DIAGNOSES:  1. Status post removal of femoral to femoral bypass graft secondary to acute     infection.  2. Hyperlipidemia.  3. Type 2 insulin dependent diabetes mellitus.  4. Degenerative joint disease.   PROCEDURE PERFORMED:  Right axillofemoral bypass with 8 mm Gore-Tex Intering  graft.   HISTORY:  The patient is a 70 year old white female who underwent a left to  right, femoral to femoral bypass graft by Dr. Arbie Cookey in May of 2003.  She did  well postoperatively until approximately one month ago, at which time she  presented with extensive infection of the graft, including erosion into her  abdomen and suprapubic area.  At that time she underwent removal of the  infected graft and has been treated with antibiotics, with resolution of the  infection. She continues to have significant symptoms of claudication, which  limits her ambulation.  She also started to develop ischemic changes of the  right lower extremity.  She was seen recently in _____ office and followed  up by Dr. Arbie Cookey, and ankle brachial indices were performed, which were 0.60  on the left and 0.15 on the right.  She has a history, as well, of a right  iliac occlusion.  Because of her significant symptoms, it was felt that she  would require revascularization at this time.  However, because of her  recent abdominal  suprapubic components of the infection of her femoral to  femoral bypass graft, it was felt that an aortofemoral bypass would be  risky.  Because of these issues, it was recommended that she proceed with a  right axillofemoral bypass at this time.   HOSPITAL COURSE:  She was admitted on 04/19/02 and taken to the operating  room, where she underwent a right axillofemoral bypass with 8 mm Gore-Tex  Intering graft by Dr. Arbie Cookey.  She tolerated the procedure well. She was  transferred to the floor in stable condition.   Postoperatively she has done well. She has had strong 2-3+ dorsalis pedis  and posterior tibial pulses in her right foot.  Her incisions are healing  well. She has been ambulating in the hospital without difficulty.  She has  remained afebrile and all vital signs have  been stable.  She is tolerating a  regular diet and is having normal bowel and bladder function.  Her pain is  well controlled with p.o. pain medications.  It is thought at this time she  may be ready for discharge home.  Of note, her ankle brachial indices were  repeated postoperatively and her new values are 0.88 on the left and 0.71 on  the right.   DISCHARGE MEDICATIONS:  1. Cipro 500 mg b.i.d. which she will continue for several weeks     postoperatively.  2. Vicodin 5/500, one to two, q.3-4h. p.r.n. for pain.  3. Humulin N, 40 units q.a.m. and 20 units q.p.m. with sliding scale insulin     as directed by her medical doctor.  4. Xanax 0.25 mg q.h.s.   DISCHARGE INSTRUCTIONS:  She is to refrain from driving, heavy lifting, or  strenuous activity.  She may continue daily walking and use of incentive  spirometry. She may continue her same preoperative diet.  She is asked to  shower daily and clean her incisions with soap and water.   FOLLOW UP:  She is scheduled to see Dr. Arbie Cookey in followup in the office on  Thursday, 05/13/02, at 9 a.m.  Ankle brachial indices will be repeated again  at that time.  She is  also asked to follow up with her primary care  physician in the next 1-2 weeks as well.     Gina L. [name], P.A.                     Larina Earthly, M.D.    GLC/MEDQ  D:  04/20/2002  T:  04/24/2002  Job:  [id number]   cc:   Selinda Flavin

## 2011-02-08 NOTE — H&P (Signed)
[place]. [facility]  Patient:    [name], [name] Visit Number: [id number] MRN: [id number]          Service Type: SUR Location: 2000 2021 01 Attending Physician:  Alyson Locket Dictated by:   Dominica Severin, P.A. Admit Date:  02/08/2002 Discharge Date: 02/10/2002   CC:         CVTS Office   History and Physical  DATE OF BIRTH:  03/20/1941  CHIEF COMPLAINT:  Right peripheral vascular occlusive disease.  HISTORY OF PRESENT ILLNESS:  This is a 70 year old Caucasian female who was seen in the office on Jan 28, 2002, for evaluation of right leg symptoms of numbness and pain.  This occasionally occurs at rest and is increased with walking.  It limits her ambulation and has been going on for approximately two years and has been worsening.  There are no left leg symptoms.  She does experience night cramps in both legs.  She underwent an angiogram today of her bilateral lower extremities on Feb 03, 2002, which confirmed iliac disease on the right, which is not amenable to angioplasty.  It is recommended that the patient undergo a left to right fem-fem bypass graft on Feb 08, 2002, by Larina Earthly, M.D.  PAST MEDICAL HISTORY: 1. Peripheral vascular occlusive disease. 2. Insulin-dependent diabetes mellitus. 3. Degenerative joint disease of the lumbosacral spine. 4. Hyperlipidemia.  PAST SURGICAL HISTORY: 1. Hysterectomy in 1987. 2. Facial surgery in 2000.  MEDICATIONS: 1. Darvocet-N 100 one tablet twice a day. 2. Lortab 10/500 mg one tablet q.h.s. 3. Xanax 0.25 mg one tablet q.h.s. 4. Lantus 24 units subcu q.p.m. 5. Sliding scale of Regular Insulin.  ALLERGIES:  PENICILLIN causing swelling and shortness of breath, as well as ASPIRIN causing swelling and shortness of breath.  REVIEW OF SYSTEMS:  Please see the HPI for significant positives.  Cardiac: The patient denies any history of coronary artery disease, hypertension, paroxysmal nocturnal  dyspnea, arrhythmia, history of chest pain, and myocardial infarction.  Pulmonary:  The patient denies any history of lung disease, asthma, COPD, recent upper respiratory tract infection, or cough. She does not get easily short of breath at rest or with exertion.  Neurologic: She does have some extremity numbness secondary to her right leg claudication. Otherwise denies any history of seizures, headaches, strokes, or TIAs. Hematology:  Denies any history of anemia or bleeding disorders.  Endocrine: The patient is diabetic.  She denies any history of thyroid problems. Gastrointestinal:  The patient denies any history of hiatal hernia, ulcers, bleeding from above, black, tarry stools, or bowel troubles.  Genitourinary: The patient has a history of urinary tract infection.  She has increased frequency secondary to diabetes, although there is no hematuria or incontinence.  She is not on dialysis.  Musculoskeletal:  She has degenerative joint disease in her lower back.  HEENT:  She wears reading glasses.  Denies any history of glaucoma, cataracts, or macular degeneration.  She wears upper and lower, as well as partial, dentures.  She has no nose problems or throat problems.  FAMILY HISTORY:  The patients mother is deceased at 22.  She had diabetes and reproductive cancer.  Her father is deceased at 58.  He had heart failure and renal failure, as well as hypertension.  The patients sister is deceased at 43 from diabetes, end-stage renal disease, and coronary artery disease.  She had another sister who is deceased at 67 from cancer of the colon and liver. She had three brothers  who are deceased, one at 39 from lung cancer, one at 86 from prostate, and the other at 51 from asthma and emphysema.  SOCIAL HISTORY:  She has been married for 30 years.  [place].  She has five children who are living.  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  She denies any alcohol use.  She has a remote history of smoking.  She quit in 2000.  She smoked an average of a pack to packs per day for 45 years.  PHYSICAL EXAMINATION:  The blood pressure is 135/50, pulse 72, and respirations 16.  GENERAL APPEARANCE:  This is a 70 year old Caucasian female who was in no acute distress.  She was alert and oriented x 3.  HEENT:  The head is normocephalic and atraumatic.  The eyes are PERRLA/EOMI without cataracts, glaucoma, or macular degeneration.  NECK:  Supple without JVD, bruits, or lymphadenopathy.  CHEST:  Symmetrical on inspiration.  LUNGS:  Without wheezes, rales, rhonchi, or lymphadenopathy.  CARDIAC:  The heart is regular rate and rhythm without murmurs, rubs, or gallops.  ABDOMEN:  Soft and nontender with positive bowel sounds x 4 quadrants without masses or bruits.  GENITOURINARY:  Deferred.  RECTAL:  Deferred.  EXTREMITIES:  Without clubbing, cyanosis, edema, or ulcerations.  Temperature: She has a warm left lower extremity and she has decreased temperature on the right lower extremity.  PULSES:  On the left side, she has 2+ carotid, femoral, popliteal, and pedal pulses.  On the right side, she has 2+ carotid pulses.  There are no femoral, popliteal, dorsalis pedis, or posterior tibial pulses palpable.  NEUROLOGIC:  There are no focal deficits.  She has a steady gait.  Her deep tendon reflexes are 2+.  Muscle strength is 2+ bilaterally.  ASSESSMENT AND PLAN:  Peripheral vascular occlusive disease.  She is to undergo a left to right femoral to femoral bypass graft on Feb 08, 2002.  Larina Earthly, M.D., has seen and evaluated this patient prior to this admission and has explained the risks and benefits of the procedure.  The patient has agreed to continue. Dictated by:   Dominica Severin, P.A. Attending Physician:  Alyson Locket DD:  02/03/02 TD:  02/04/02 Job: [id number]

## 2011-02-08 NOTE — Discharge Summary (Signed)
[place]. [facility]  Patient:    [name], [name] Visit Number: [id number] MRN: [id number]          Service Type: SUR Location: 2000 2029 01 Attending Physician:  Alyson Locket Dictated by:   Maxwell Marion, RNFA Admit Date:  03/16/2002 Discharge Date: 03/24/2002   CC:         Selinda Flavin, M.D. in [place], [place]   Discharge Summary  DATE OF BIRTH:  Jul 08, 1941  ADMISSION DIAGNOSIS:  Infected cross terminal bypass graft.  PAST MEDICAL HISTORY: 1. Peripheral vascular occlusive disease, status post left-to-right femoral    femoral bypass graft on Feb 08, 2002. 2. Insulin-dependent diabetes mellitus. 3. Hyperlipidemia. 4. History of nicotine abuse. 5. Degenerative joint disease.  PAST SURGICAL HISTORY: 1. Hysterectomy in 1987. 2. Facial surgery in 2000.  ALLERGIES:  PENICILLIN which causes swelling and shortness of breath (cephalosporins are well tolerated).  ASPIRIN.  DISCHARGE DIAGNOSIS:  Infected cross femoral graft, status post removal and bilateral femoral vein patch angioplasties.  HISTORY OF PRESENT ILLNESS:  [name] is a 70 year old Caucasian female who was discharged home on Feb 10, 2002, after surgery.  On March 12, 2002, while she was on vacation, her right groin wound opened and purulent material was discharged.  She presented to the CVTS office on March 16, 2002, and was evaluated by Dr. Jerilee Field.  On examination, her right groin was with purulent drainage, as well as red and purulent ulcerations over the top of her graft in the lower abdomen.  Her graft was patent.  She had palpable bilateral lower extremity pulses.  Dr. Hart Rochester recommended admission to the hospital and urgent removal of her graft.  HOSPITAL COURSE:  On March 16, 2002, [name] was urgently admitted to [facility] under the care of Dr. Kristen Loader. Early.  She was taken urgently to the operating room.  Procedure performed was removal of dissected  cross femoral graft with bilateral femoral vein patch angioplasties.  She tolerated this procedure well, and was transferred in stable condition to the PACU. Intraoperative cultures were sent to the laboratory.  Postoperatively, she was empirically started on IV vancomycin and Cipro.  [name] has remained hemodynamically stable since her surgery.  Early in the a.m. on postoperative day #1, [name] reportedly had no Doppler signal in her right foot.  Dr. Waverly Ferrari was called, and he evaluated her in her hospital room.  He found her right foot to be at baseline prior to her cross femoral bypass.  He did initiate heparin drip.  Later that day, t.i.d. dressing changes wet-to-dry were also initiated.  On March 19, 2002, cultures returned as Group B Strep, suseptible to ampicillin, penicillin, and vancomycin.  Her Cipro was discontinued.  Her vancomycin was continued.  On postoperative day #6, March 22, 2002, [name] graft removal sites were clean with granulation tissue.  Her right lower extremity was stable, and she was experiencing no rest pain.  She was ambulating independently.  Her pain is well controlled.  Her heparin drip was discontinued this day, as well as IV vancomycin was changed to p.o. Keflex.  Plans were to observe for 24 to 48 hours, then discharge home on p.o. antibiotics and daily dressing changes by home health services.  LABORATORY DATA:  Recent laboratory studies on March 22, 2002, white blood cell count 7.1, hemoglobin 9.4, hematocrit 28.4, platelets 559.  CONDITION ON DISCHARGE:  Improved.  ACTIVITY:  No driving or heavy lifting.  She has been asked to continue her breathing exercises and daily walking.  DIET:  Diabetic diet.  WOUND CARE:  A home health nurse will visit daily for dressing changes. Dressing changes will be with Hydrogel and moist gauze and Kerlix.  DISCHARGE MEDICATIONS: 1. Vicodin one or two p.o. q.4-6h. p.r.n. pain. 2.  Keflex 500 mg p.o. q.8h. 3. Resume her home medications of Humulin insulin 40 units q.a.m. and 20 units    q.p.m., and Xanax 0.25 mg q.h.s.  FOLLOWUP:  She has an appointment to see Dr. Arbie Cookey at the CVTS office on April 08, 2002, at 10:20 a.m. Dictated by:   Maxwell Marion, RNFA Attending Physician:  Alyson Locket DD:  03/23/02 TD:  03/25/02 Job: [id number]

## 2011-02-08 NOTE — H&P (Signed)
[place]. [facility]  Patient:    [name], [name] Visit Number: [id number] MRN: [id number]          Service Type: DSU Location: [phone] Attending Physician:  Alyson Locket Dictated by:   Dominica Severin, P.A. Admit Date:  03/16/2002   CC:         CVTS Office  Dr. Selinda Flavin, [place], [facility]   History and Physical  DATE OF BIRTH:  12/02/1940.  CHIEF COMPLAINT:  Infect fem-fem bypass graft.  HISTORY OF PRESENT ILLNESS:  This is a 70 year old Caucasian female who was referred from the office after being seen by Dr. Hart Rochester and found to have an infected fem-fem bypass graft.  This patient underwent a fem-fem on Feb 08, 2002, by Dr. Arbie Cookey.  She had done well after surgery and was discharged home on Feb 10, 2002.  She did not have any complications.  She states her lower abdomen had always been a little red but there was no pain and only burning sensations although this past Friday on March 12, 2002, the patient was on vacation and in the afternoon the wound on her right groin had just opened up and purulent material was discharged.  There was also some red ulcerations with purulent material on top of the graft on her lower abdomen.   She had some tenderness over that area although otherwise had no other pain.  Her graft is opened.  She has palpable pulses in the lower extremities so she is stable from a vascular standpoint.  Nevertheless the graft needs to be removed urgently secondary to infection.  PAST MEDICAL HISTORY: 1. Peripheral vascular occlusive disease status post left to right fem-fem    bypass graft on Feb 08, 2002. 2. Hyperlipidemia. 3. Insulin-dependent diabetes mellitus. 4. History of nicotine abuse. 5. Degenerative joint disease. 6. Status post hysterectomy in 1987. 7. Status post facial surgery in 2000.  ALLERGIES AND INTOLERANCES:  The patient is allergic to PENICILLIN causing swelling and shortness of breath as  well as aspirin.  MEDICATIONS: 1. Humulin 40 units in the morning and 20 units at bedtime. 2. Xanax 0.25 mg q.h.s. 3. Lortab p.r.n.  SOCIAL HISTORY:  The patient is married and has 5 children.  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  Tobacco use:  She quit in 2000.  She smoked 1 to 1-1/2 packs for 45 years.  She does not drink alcohol. She lives with her husband.  She does drive.  She has no assistance available.  FAMILY HISTORY:  Patients mother is deceased at 82 from diabetes mellitus and reproductive cancer.  The patients father was deceased at 84 from CHF, renal failure and hypertension.  The patient has 5 siblings who are deceased from diabetes mellitus, end-stage renal disease, coronary artery disease, cancer of the colon and liver, lung cancer, prostate cancer, asthma and emphysema between all of them.  REVIEW OF SYSTEMS:  General:  The patient denies any fever or chills.  HEENT: She denies any headache, vision changes, hearing changes, difficulty chewing, neck pain, stiffness.  She does not wear contacts and she does not wear eyeglasses.  Pulmonary:  The patient denies any shortness of breath, or dyspnea on exertion or paroxysmal nocturnal dyspnea or sleep apnea.  She does not have a cough.  Cardiac:  She denies any chest pain, pressure, palpitations, or pedal edema, although her right lower extremity is swollen. Gastrointestinal:  The patient denies any weight changes.  Denies any regurgitation, dysphagia.  She did have 1 episode of vomiting last week. Denies any change in her bowel habits.  She follows a diabetic diet. Genitourinary:  She does have some frequency secondary to diabetes mellitus but denies any dysuria, hematuria, or hesitancy.  Neuromuscular:  She denies any dizziness, syncope, paresthesia, tremors, joint pain, stiffness, swelling, muscle pain, weakness, back pain, and she does ambulate without difficulty. Hematological:  She does  have an infected graft.  There is no fever, fatigue, night sweats, bruising, bleeding tendencies, nose bleeds or blood clots. Psychiatric:  The patient denies any depression, anxiety or memory disturbances.  PHYSICAL EXAMINATION:  VITAL SIGNS:  Blood pressure 141/62, heart rate 91.  Respirations 20. Temperature 98.2.  Height 4 feet 7 inches.  Weight is 126 pounds.  GENERAL:  This is a 70 year old Caucasian female who is in alert and no acute distress who is alert and oriented x 3. She is lying down in the short stay center of [facility].  HEENT:  Normocephalic and atraumatic.  Eyes:  pupils, equal, round, reactive to light and accommodation/ EOMI without cataracts, glaucoma or macular degeneration.  Her oral mucosa is pink and moist.  Her nose is patent.  Throat is clear.  NECK:  Full range of motion.  There is no carotid bruits, thyromegaly or lymphadenopathy.  CHEST:  Unlabored, breathing with clear breath sounds bilaterally.  HEART:  Regular rate and rhythm without murmurs, gallops, or rubs.  ABDOMEN:  Soft, nontender with positive bowel sounds x 4 quadrants.  She has tenderness over her graft with erythema and bilateral groins are erythematous with a 1 cm opening and green yellow purulent discharge from her right groin. Her left groin is closed.  Although it does have erythema there is warmth and she also has ulcerations on top of her graft on her lower abdomen.  GENITOURINARY AND RECTAL:  Deferred.  VASCULAR:  She has 2+ pedal pulses bilaterally, and 2+ femoral pulses bilaterally.  NEUROMUSCULAR: She is alert and oriented x 3.  Cranial nerves II-XII grossly intact.  Range of motion is full in both her upper and lower extremities bilaterally and muscle strength is 4+ on both her upper and lower extremities bilaterally.  LABORATORY DATA AND RADIOLOGY:  Pending at this time.   IMPRESSION:  Infected femoral to femoral bypass graft.  PLAN:  Take patient to the  operating room urgently for removal of her infected graft by Dr. Arbie Cookey. Dictated by:   Dominica Severin, P.A. Attending Physician:  Alyson Locket DD:  03/16/02 TD:  03/17/02 Job: [id number]

## 2011-02-08 NOTE — Op Note (Signed)
[place]. [facility]  Patient:    [name], [name] Visit Number: [id number] MRN: [id number]          Service Type: SUR Location: 2000 2021 01 Attending Physician:  Alyson Locket Dictated by:   Larina Earthly, M.D. Proc. Date: 02/08/02 Admit Date:  02/08/2002 Discharge Date: 02/10/2002                             Operative Report  PREOPERATIVE DIAGNOSIS:  Right leg claudication.  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  PROCEDURE:  Left-to-right femoral-femoral bypass with 8 mm Hemashield graft.  SURGEON:  Larina Earthly, M.D.  ASSISTANT:  Caralee Ates, M.D. and Maxwell Marion, RNFA.  ANESTHESIA:  General endotracheal.  COMPLICATIONS:  None.  DISPOSITION:  To recovery room stable.  DESCRIPTION OF PROCEDURE:  The patient was taken to the operating room and placed in the position where the area of the right and left groin were prepped and draped in the usual sterile fashion.  Oblique incisions were made over the common femoral arteries bilaterally and carried down to isolate the common femoral at the level of the inguinal ligament.  The tunnel was created from the left-to-right common femoral artery in a U-shape configuration above the pubic tubercle.  An 8 mm Hemashield graft was brought through the tunnel.  The patient was given 7000 units of intravenous heparin.  After adequate circulation time, the common femoral arteries were occluded proximally and distally bilaterally, and were opened with an 11 blade and Potts scissors. The graft was cut to the appropriate length, was spatulated, and sewn end-to-side to the common femoral arteries bilaterally with running    6-0 Prolene sutures.  Prior to the completion of each anastomosis, the usual flushing maneuvers were undertaken.  The anastomosis was completed and clamps were removed.  Good pulse was noted in the graft.  The patient was given 50  mg of Protamine to reverse the heparin.  The wound was irrigated with saline and hemostasis with electrocautery.  The wounds were closed with 2-0 Vicryl in several layers and subcutaneous tissue.  The skin was closed with 4-0 subcuticular Vicryl stitch.  A sterile was applied and the patient was taken to the recovery room in stable condition. Dictated by:   Larina Earthly, M.D. Attending Physician:  Alyson Locket DD:  02/08/02 TD:  02/09/02 Job: [id number]

## 2011-02-08 NOTE — Op Note (Signed)
[place]. [facility]  Patient:    [name], [name] Visit Number: [id number] MRN: [id number]          Service Type: DSU Location: [place] [facility] 2899 18 Attending Physician:  Alyson Locket Dictated by:   Larina Earthly, M.D. Proc. Date: 02/03/02 Admit Date:  02/03/2002 Discharge Date: 02/03/2002                             Operative Report  PREOPERATIVE DIAGNOSIS:  Right leg ischemia.  POSTOPERATIVE DIAGNOSIS:  Right leg ischemia.  OPERATION PERFORMED:  Aortogram with bilateral lower extremity run-off.  SURGEON:  Larina Earthly, M.D.  ANESTHESIA:  1% lidocaine local.  COMPLICATIONS:  None.  DISPOSITION:  To holding area stable.  DESCRIPTION OF PROCEDURE:  The patient was taken to the peripheral vascular cath lab and placed in supine position where the area of both groins was prepped and draped in the usual sterile fashion.  The patient had no right femoral pulse.  The left common femoral artery was entered with a single wall puncture and a guide wire was passed up the level of the superrenal aorta.  A 5 French sheath was passed over the guide wire and a pigtail catheter was positioned at the level of the superrenal aorta.  AP projections showed widely patent single renal arteries bilaterally.  The patient had a patent superior mesenteric artery as well.  She did have some irregularity of the infrarenal aorta with no evidence of flow limiting stenosis.  Next pig tail catheter was removed below the level of the renal arteries and pelvic arteriogram was obtained and run-off was obtained through the same injection.  This revealed a widely patent iliac system on the left with no flow limiting stenosis.  The right common femoral artery was occluded at its take off.  There was reconstitution via collaterals of the internal and external iliac arteries on the right.  The patient had a widely patent common femoral artery with a widely patent profunda femoris  artery, superficial femoral arteries bilaterally.  Due to the iliac occlusion, there was some dilution of run-off on the right.  On the left, the posterior tibial artery was occluded but the anterior tibial and peroneal arteries were patent with some stenosis of the tibial peroneal trunk.  The anterior tibial was the dominant run-off vessel in the foot on the left.  On the right, there was poorer visualization due to limitation of dye with the occluded right common iliac artery.  There was patency of the anterior tibial peroneal and posterior tibial artery with patency of the anterior tibial and posterior tibial artery into the foot.  The patient tolerated the procedure without immediate complication and was transferred to the holding area in stable condition.  FINDINGS: 1. Right common iliac artery occlusion with reconstitution of the    external iliac artery via internal iliac collaterals. 2. No evidence of aortic or left iliac significant stenosis. 3. Patent superficial femoral, popliteal and tibial vessel run-off    as described. Dictated by:   Larina Earthly, M.D. Attending Physician:  Alyson Locket DD:  02/03/02 TD:  02/04/02 Job: [id number]

## 2011-02-08 NOTE — Consult Note (Signed)
NAMEHAILEE, [name] NO.:  [id number]   MEDICAL RECORD NO.:  [id number]          PATIENT TYPE:  INP   LOCATION:  2914                         FACILITY:  [facility]   PHYSICIAN:  [name]. [name], M.D.DATE OF BIRTH:  1940-09-27   DATE OF CONSULTATION:  07/25/2006  DATE OF DISCHARGE:                                   CONSULTATION   REASON FOR CONSULTATION:  Three-vessel coronary disease.   HISTORY OF PRESENT ILLNESS:  [name] is a 70 year old lady with a history  of diabetes, hyperlipidemia, remote tobacco abuse and known peripheral  vascular disease.  She had an episode on the night prior to admission of  diaphoresis, she was very dizzy and then had some chest discomfort although  it was not severe.  She came to work yesterday and had a similar episode.  She was taken to the emergency room where she was found to have an elevated  glucose at 600.  She also had evidence of a non-Q-wave myocardial infarction  with a troponin of 12.6.  Today she underwent cardiac catheterization where  she was found to have three-vessel coronary disease, left ventriculogram was  not done due to renal insufficiency and she has been chest pain free since  admission to the hospital.   PAST MEDICAL HISTORY:  Her past medical history is significant for  peripheral vascular disease with previous left femoral to right femoral  bypass with the graft having to be removed due to infection and subsequent  right axillary to right femoral bypass by Dr. Arbie Cookey in 2003.  She has a  history of type 2 diabetes, remote heavy tobacco abuse, COPD, hypertension,  hyperlipidemia and renal insufficiency with a baseline creatinine of 1.8 in  August 2007.   MEDICATIONS ON ADMISSION:  1. Vytorin 10/80 one tablet daily.  2. Lortab 10/500 b.i.d.  3. Humulin 40 units subcu q.a.m., 20 units subcu q.p.m.  4. Altace 10 mg p.o. b.i.d.  5. Labetalol 200 mg b.i.d.  6. Xanax 0.5 mg p.o. q.h.s.   ALLERGIES:   SHE IS ALLERGIC TO ASPIRIN WHICH CAUSES THROAT SWELLING, ALSO  PENICILLIN AND DARVOCET.   REVIEW OF SYSTEMS:  Dizziness.  No frank syncopal episodes.  No fevers,  chills or sweats.  Denied any dysuria or pyuria.  No recent change in bowel  or bladder habits.  No recent respiratory infection.  She does have a runny  nose.  No history of excessive bleeding or bruising.  She does have chronic  anemia.  She also has degenerative joint disease with arthritis particularly  in her back.   SOCIAL HISTORY:  She is married.  She lives in [place].  She has not smoked in  7 years (she was two packs a day prior to that).  She does not use alcohol.   FAMILY HISTORY:  Family history is significant for atherosclerotic  cardiovascular disease.   PHYSICAL EXAMINATION:  VITAL SIGNS:  [name] is a 70 year old female.  Blood pressure 150/50, pulse 70, respirations are 20.  She is 100% saturated  on 2 liters nasal cannula.  She is  5 feet 5 inches tall and weighs 58 kg.  GENERAL APPEARANCE:  She is thin but well-developed and well-nourished.  She  is in no acute distress.  NEUROLOGICAL:  She is alert and oriented x3.  She is appropriate and grossly  intact.  HEENT:  Exam is unremarkable.  NECK:  Neck is supple without thyromegaly, adenopathy or bruits.  LUNGS:  Clear with equal breath sounds bilaterally.  HEART:  Her cardiac exam has regular rate and rhythm.  Normal S1-S2.  There  are no rubs or murmurs.  ABDOMEN:  Soft and nontender.  EXTREMITIES:  She has 2+ dorsalis pedis and posterior tibial pulses  bilaterally.  There is no significant varicosities.  She does have a  palpable pulse in her Gore-Tex graft and the right ax-fem graft present on  her torso.  SKIN:  Warm and dry.   LABORATORY DATA:  Cardiac enzymes:  Initial CK was 211 with an MB of 10.7.  Initial troponin was 5.6 and then rose to 12.6.  She had an elevated  microalbumin level in her urine.  Her lipid profile showed a cholesterol of   188 with an HDL 36 and LDL of 119.  Her white count was 7.9, hematocrit 27,  platelets 204 and her BNP today showed a sodium of 140, potassium 3.7, BUN  and creatinine 30 and 1.9, her glucose was 60.   IMPRESSION:  [name] is a 70 year old woman with multiple cardiac risk  factors who presents with a presyncopal episode.  She had a markedly  elevated blood sugar in association with a likely urinary tract infection  which may have been the cause of her symptoms but did rule in for a non-Q-  wave myocardial infarction and at catheterization had severe three-vessel  coronary disease, which, in her case, would be best treated with coronary  artery bypass grafting due to her diabetes and the bifurcation lesion in the  left circumflex.  She would need grafts to her right coronary, left anterior  descending and obtuse marginals #1 and #2 to achieve a complete  revascularization.   I discussed in detail with the patient and her family (her husband,  daughters and son were present) the nature and extent of the operation which  the family is familiar with.  We did discuss also the indications, risks,  benefits and alternatives.  She understands that the risks include but are  not limited to death, stroke, myocardial infarction, deep vein thrombosis,  pulmonary embolus, bleeding, possible need for transfusions, infections as  well as other organ system dysfunction including respiratory, renal, hepatic  or gastrointestinal complications.  She is at increased risk for renal  complications due to her baseline renal insufficiency.  She will need  preoperative carotid duplex to rule out carotid disease given her history of  peripheral vascular disease.  She also needs treatment for her urinary tract  infection and we will begin Cipro 500 mg p.o. b.i.d.   She did receive two doses of Plavix but no loading dose and I do not think this should delay surgery.  At the present time the first available   operating date is Tuesday, July 29, 2006.  She will be scheduled for  operation at that time and will discontinue the Plavix and as stated we will  start ciprofloxacin.  [name] understands and accepts the risks of  surgery and does wish to proceed.           ______________________________  [name] [name],  M.D.     SCH/MEDQ  D:  07/25/2006  T:  07/26/2006  Job:  [id number]   cc:   Arturo Morton. Riley Kill, MD, [facility]  Tommie Ard, M.D.

## 2011-02-08 NOTE — Op Note (Signed)
NAMEGLENN, [name]                ACCOUNT NO.:  [id number]   MEDICAL RECORD NO.:  [id number]          PATIENT TYPE:  INP   LOCATION:  2301                         FACILITY:  [facility]   PHYSICIAN:  [name]. [name], M.D.DATE OF BIRTH:  08-17-1941   DATE OF PROCEDURE:  07/27/2006  DATE OF DISCHARGE:                                 OPERATIVE REPORT   PREOPERATIVE DIAGNOSIS:  Three-vessel coronary disease with unstable angina.   POSTOPERATIVE DIAGNOSIS:  Three-vessel coronary disease with unstable  angina.   PROCEDURE:  Emergency median sternotomy, extracorporeal circulation,  coronary bypass grafting x4 (left internal mammary artery to the LAD,  composite saphenous vein graft to right coronary, sequential saphenous vein  graft obtuse marginals I and II), vein harvested from both legs with a  combination of open and endoscopic technique.   SURGEON:  Charlett Lango, M.D.   ASSISTANT:  [name], PA   ANESTHESIA:  General.   FINDINGS:  The majority of saphenous vein not suitable for use as a bypass  graft.  Ultimately we were able to find a satisfactory amount of vein, but  required a composite graft to reach the distal right coronary.  The left  internal mammary artery was a good quality vessel.  There was sternal  osteoporosis.  Coronary targets were of good quality.   CLINICAL NOTE:  [name] is a 70 year old with multiple cardiac risk  factors who presented with a presyncopal episode.  She ruled in for a non-Q-  wave myocardial infarction and at catheterization was found to have severe  three-vessel coronary disease.  She was referred for coronary bypass  grafting and was scheduled for elective bypass grafting, however, on the  morning of July 27, 2006 she had a prolonged episode of chest pain, which  did not completely resolve and after a discussion with the patient agreed to  undergo emergency surgery.  The patient was informed of the indications,  risks,  benefits, and alternatives.  She understood the risks, accepted them  and agreed to proceed.  She was transported directly from the CCU to the  operating room.   OPERATIVE NOTE:  [name] was brought to the operating room on July 27, 2006.  The anesthesia service placed lines for monitoring arterial, central  venous and pulmonary arterial pressure.  ECG leads were in place for  continuous telemetry.  Intravenous antibiotics were administered.  She was  anesthetized and intubated.  A Foley catheter was placed.  The chest,  abdomen and legs were prepped and draped in the usual fashion.  A median  sternotomy was performed and the left internal mammary artery was harvested  using standard technique.  Simultaneously an incision was made in the medial  aspect of the left leg at the level of the knee and no reasonable quality  vein could be found at that site.  Therefore, an incision was made in the  medial aspect of the right leg at the level of the knee and the greater  saphenous vein was harvested endoscopically from the right thigh.  This was  a bifurcated system  and only a small portion distally towards the groin was  suitable for use as a bypass graft.  Incisions then were made in the left  leg just below the knee and the vein from the groin in the left leg was  usable and suitable for the sequential graft to the obtuse marginal vessels.  Additional vein was harvested from the left ankle below the knee on the  right.  There was no usable vein.  Finally a segment from the left ankle was  used to create a composite graft with a segment from the right groin which  was of adequate length to bypass the right coronary.  Five thousand units of  heparin had been administered during the vessel harvest.  The remainder of  the full heparin dosing was administered.  The pericardium was opened.  The  ascending aorta was inspected.  There was no atherosclerotic disease.  The  aorta was cannulated  via concentric 2-0 Ethibond pledgeted pursestring  sutures.  A dual stage venous cannula was placed via pursestring suture in  the right atrial appendage.  Cardiopulmonary bypass was instituted and the  patient was cooled to 32 degrees Celsius.  The coronary arteries were  inspected and anastomotic sites were chosen.  The conduits were inspected  and cut to length.  The foam pad was placed in the pericardium to protect  the left phrenic nerve.  A temperature probe was placed in the myocardial  septum and a cardioplegic cannula was placed in the ascending aorta.  The  aorta was crossclamped.  The left ventricle was emptied via the aortic root  vent.  Cardiac arrest then was achieved with a combination of cold antegrade  blood cardioplegia and topical iced saline.  After achieving a complete  diastolic arrest and adequate myocardial septal cooling the following distal  anastomoses were performed.   First, a reversed composite saphenous vein graft was placed end-to-side in  the distal right coronary.  This was a 1.5-mm good quality target.  The vein  graft was anastomosed end to side with a running 7-0 Prolene suture.  All  anastomoses were probed proximally and distally at their completion to  ensure patency.  Cardioplegia then was administered down the vein grafts at  their completion.   Next, a reversed saphenous vein graft was placed sequentially to obtuse  marginals I and II.  These were both 1.5 mm good quality target.  A side-to-  side anastomosis was performed to OM1 and an end-to-side to OM II.  Both  were performed with running 7-0 Prolene sutures.   Next, the left internal mammary artery was brought through a window in the  pericardium.  The distal end was spatulated and then was anastomosed end-to-  side to the LAD.  The LAD was a 1.5-mm good quality target.  The mammary was  a good quality conduit.  The anastomosis was performed with a running 8-0 Prolene suture in an  end-to-side fashion.  At the completion of the mammary  to LAD anastomosis a bulldog clamp was briefly removed and inspected for  hemostasis.  Immediate rapid septal rewarming was noted. The bulldog clamp  was replaced.  Additional cardioplegia was administered.  The cardioplegic  cannulas were removed from the ascending aorta and the proximal vein graft  anastomoses were performed up to 4.5 mm punch aortotomies while under  crossclamp.  The anastomoses were done with running 6-0 Prolene sutures.   At the completion of the final proximal anastomoses the  patient was placed  in Trendelenburg position.  Bulldog clamp was removed from the left mammary  artery.  Lidocaine was administered.  The aortic root was de-aired and the  aortic crossclamp was removed.  The total crossclamp time was 64 minutes.  The patient required a single defibrillation with 20 joules.  She was in  sinus rhythm thereafter.   While the patient was being rewarmed all proximal and distal anastomoses  were inspected for hemostasis.  Epicardial pacing wires were placed on the  right ventricle and right atrium.  When the patient rewarmed to a core  temperature of 37 degrees Celsius she was weaned from cardiopulmonary bypass  without difficulty.  Total bypass time was 99 minutes.  The patient was on a  low-dose dopamine infusion at the time of separation from bypass at 3 mcg  per kilogram per minute which had been initiated at the beginning of bypass  due to her renal insufficiency.  The initial cardiac index was greater than  2 liters per minute per meter squared.  She remained hemodynamically stable  throughout the post bypass period.   The test dose of protamine was administered and well tolerated.  The atrial  and aortic cannulae were removed.  The remainder of the protamine was  administered without incident.  The chest was irrigated with 1 liter of warm  normal saline containing 1 gram of vancomycin.  Hemostasis was  achieved.  A  left pleural and two mediastinal chest tubes were placed through separate  subcostal incisions.  The pericardium was reapproximated with interrupted 3-  0 silk sutures.  It came tog ether easily without tension.  The sternum was  closed with interrupted heavy gauge stainless steel wires.  The remainder of  the incisions were closed in standard fashion.  A subcuticular closure was  used for the  skin and the chest and the leg.  Several areas of the skin was too thin to  be treated with subcuticular stitches and was closed with staples.  All  sponge, needle and instrument counts were correct at the end of the  procedure.  The patient was taken from the operating room to the surgical  intensive care unit in critical, but stable condition.           ______________________________  [name] [name], M.D.     SCH/MEDQ  D:  07/29/2006  T:  07/30/2006  Job:  [id number]   cc:   Arturo Morton. Riley Kill, MD, [facility]  Selinda Flavin

## 2011-02-08 NOTE — Assessment & Plan Note (Signed)
[facility]                          [facility] OFFICE NOTE   [name], [name]                   MRN:          [id number]  DATE:01/13/2007                            DOB:          1941-04-28    [name] returns today for followup.  [name] was last seen in  January of this year for further evaluation of hypertension, status post  MI, coronary artery bypass grafted in 2007.  When patient was here in  January, she was expressing concerns about medication secondary to  financial issues.  She was referred to social work for assessment for  assistance in providing Plavix.  Her clonidine was also increased to 0.2  mg p.o. b.i.d. for blood pressure control.  [name] states that she  has been compliant in taking her medications as ordered.  She is most  concerned about her diabetes today.  She apparently has very labile  diabetes that have been difficult to manage.  She states that her blood  sugars have been dropping down in the 40s at night and then up to 200-  500 during the day since she had her bypass last fall.  She states that  Dr. Dimas Aguas is following her diabetes, and she actually is going to make  an appointment to see her sooner than previously arranged for followup.  She also has questions concerning her statin, previously started on  pravastatin in January of this year.  She is concerned that maybe she  needs to be on Lipitor for improved management.  She is pending fasting  lipids and LFTs also.  [name] was also hospitalized in March of this  year for acute volume overload.  She was actually seen by our group in  consultation during that admission by Dr. Myrtis Ser.  [name] was started  on diuretics with good response.  Had an echocardiogram done that showed  an ejection fraction of 55% with some inferior hypokinesis, mild  tricuspid regurgitation.  [name] states she has been doing well  since she was started on the  diuretic.  Denies any orthopnea, PND,  peripheral edema, increased abdominal girth.  No shortness of breath.  States she is resting well.  Is pleased with her progress from a cardiac  perspective.  Most concerned about her diabetes at this time.   PAST MEDICAL HISTORY:  1. Coronary artery disease, status post non-ST elevated MI.  Status      post coronary artery bypass grafting.  2. Labile diabetes.  3. Hypertension.  4. COPD.  5. Dyslipidemia.  6. Renal insufficiency.  Creatinine baseline around 1.9 with recent      elevation in creatinine up to 3.3 during hospitalization for heart      failure in March of this year.  7. Hyperlipidemia.  8. Peripheral vascular disease, status post right axillofemoral bypass      graft in 2003.  9. Allergy to ASPIRIN.  10.Left pleural effusion, status post CABG.  11.Remote history of heavy tobacco use.  12.History of mild acute tubular necrosis secondary to CABG.   REVIEW OF SYSTEMS:  As stated above.  ALLERGIES:  Include PENICILLIN and ASPIRIN.   CURRENT MEDICATIONS:  1. Humulin N 20 units q.a.m.  2. Xanax 0.5 b.i.d.  3. Clonidine 0.2 b.i.d.  4. Lasix 40 daily.  5. Plavix 75.  6. Imdur 30.  7. Pravastatin 40.  8. Labetalol 300 b.i.d.  9. Humulin regular insulin, sliding-scale coverage.   PHYSICAL EXAMINATION:  VITAL SIGNS:  Blood pressure 131/61 with a heart  rate of 80.  Satting at 99% on room air.  Weight 133.4 pounds.  GENERAL:  [name] is in no acute distress.  A pleasant 70 year old  Caucasian female, alert and oriented.  HEENT:  Normocephalic and atraumatic.  Pupils are equal, round and  reactive to light.  Sclerae are clear.  NECK:  Supple without lymphadenopathy.  Negative bruits.  No JVD.  LUNGS:  Clear to auscultation bilaterally.  Distant breath sounds,  bilateral bases.  CARDIOVASCULAR:  S1 and S2.  Regular rate and rhythm.  ABDOMEN:  Soft and nontender.  EXTREMITIES:  Lower extremities without clubbing, cyanosis or  edema.   IMPRESSION:  1. Coronary artery disease:  Stable at this time.  Patient without      symptoms of angina.  Will continue current medications, including      Plavix.  2. Labile diabetes, being managed by Dr. Dimas Aguas.  3. Recent hospitalization for acute pulmonary edema.  No signs of      volume overload at this time.  Continue Lasix at current dose.  4. Renal insufficiency with a creatinine elevated up to 3.3 during      hospitalization in March.  Will check BMET.  5. Hypercholesterolemia with initiation of pravastatin in January of      this year.  Patient has concerns that she should be on Lipitor      instead of pravastatin for improved medication.  I have offered up      to her we can go ahead and switch her to the Lipitor, or we can      proceed with the fasting LFTs and lipids.  Patient has no      improvement in her cholesterol.  We can switch her over to the      Lipitor if numbers are improved.  She can continue the pravastatin,      and we will monitor lipid status.  She has opted for going ahead      and checking the numbers now and then re-evaluate.  Otherwise, we      will continue current medications.  Patient is      stable at this time.  She is to watch for any signs suggestive for      volume overload or angina, and we will see her back in four months      or sooner if she has any problems.      Dorian Pod, ACNP  Electronically Signed      Learta Codding, MD,FACC  Electronically Signed   MB/MedQ  DD: 01/13/2007  DT: 01/13/2007  Job #: [id number]   cc:   Selinda Flavin

## 2011-02-13 ENCOUNTER — Ambulatory Visit (INDEPENDENT_AMBULATORY_CARE_PROVIDER_SITE_OTHER): Payer: Medicare Other | Admitting: Cardiology

## 2011-02-13 ENCOUNTER — Encounter: Payer: Self-pay | Admitting: Cardiology

## 2011-02-13 DIAGNOSIS — I10 Essential (primary) hypertension: Secondary | ICD-10-CM

## 2011-02-13 DIAGNOSIS — R0989 Other specified symptoms and signs involving the circulatory and respiratory systems: Secondary | ICD-10-CM

## 2011-02-13 DIAGNOSIS — I428 Other cardiomyopathies: Secondary | ICD-10-CM

## 2011-02-13 DIAGNOSIS — N186 End stage renal disease: Secondary | ICD-10-CM

## 2011-02-13 MED ORDER — CLONIDINE HCL 0.1 MG PO TABS: 0.1000 mg | ORAL_TABLET | Freq: Two times a day (BID) | ORAL | Status: DC

## 2011-02-13 MED ORDER — CARVEDILOL 6.25 MG PO TABS: 6.2500 mg | ORAL_TABLET | Freq: Two times a day (BID) | ORAL | Status: DC

## 2011-02-13 NOTE — Assessment & Plan Note (Signed)
As outlined above I plan to re\re increase her carvedilol slightly.

## 2011-02-13 NOTE — Assessment & Plan Note (Addendum)
Carotid bruits are heard today.  I do not note this is being followed by her vascular surgeon or not.  I have questioned her about this and is not being followed.  We will arrange for a carotid Doppler

## 2011-02-13 NOTE — Progress Notes (Signed)
HPI Patient is seen today to followup coronary disease and cardiomyopathy.  Patient has been doing better with dialysis.  She still has her temporary catheter.  The shunt continues to mature in her arm.  She did not have any chest pain or shortness of breath.  Unfortunately she has been having some back pain.  For a period of time her blood pressure had been on the low side and her carvedilol dose was cut back.  Because she has cardiomyopathy I would like to try to optimize the carvedilol and see if other medicines can be reduced. Allergies  Allergen Reactions  . Aspirin   . Penicillins     Current Outpatient Prescriptions  Medication Sig Dispense Refill  . ALPRAZolam (XANAX) 0.5 MG tablet Take 0.5 mg by mouth 3 (three) times daily as needed.        . carvedilol (COREG) 6.25 MG tablet Take 0.5 tablets (3.125 mg total) by mouth 2 (two) times daily with a meal.      . cloNIDine (CATAPRES) 0.1 MG tablet Take 0.1 mg by mouth 2 (two) times daily.        . clopidogrel (PLAVIX) 75 MG tablet Take 75 mg by mouth daily.        . folic acid-vitamin b complex-vitamin c-selenium-zinc (DIALYVITE) 3 MG TABS Take 1 tablet by mouth daily.        . furosemide (LASIX) 80 MG tablet Take 80 mg by mouth daily.        Marland Kitchen HYDROcodone-acetaminophen (LORTAB) 7.5-500 MG per tablet Take 1 tablet by mouth every 8 (eight) hours as needed.        . insulin NPH (HUMULIN N,NOVOLIN N) 100 UNIT/ML injection Inject 40 Units into the skin 2 (two) times daily. As needed      . isosorbide mononitrate (IMDUR) 30 MG 24 hr tablet Take 30 mg by mouth daily.        Marland Kitchen lisinopril (PRINIVIL,ZESTRIL) 20 MG tablet Take 20 mg by mouth daily.        . sevelamer (RENVELA) 800 MG tablet Take 6 tablets with meals and 3 tablets with snacks        History   Social History  . Marital Status: Married    Spouse Name: RALPH    Number of Children: N/A  . Years of Education: N/A   Occupational History  . RETIRED     [employer]   Social History Main Topics  . Smoking status: Former Smoker -- 2.0 packs/day for 50 years    Types: Cigarettes    Quit date: 09/23/2005  . Smokeless tobacco: Not on file   Comment: SMOKED FROM AGE 74 UNTIL 2007  . Alcohol Use: No     NO ALCOHOL SINCE THE 70'S  . Drug Use: Not on file  . Sexually Active: Not on file   Other Topics Concern  . Not on file   Social History Narrative  . No narrative on file    Family History  Problem Relation Age of Onset  . Diabetes Mother   . Kidney disease Father     Past Medical History  Diagnosis Date  . Mitral regurgitation     Mild-to-moderate, echo, January, 2010 / moderate, e eccentric jet, echo, March, 2012  . Ejection fraction     40%, echo, January, 2010 / EF 20-25%, echo, hospital, June, 2011  / EF 25-30%, echo, March, 2012  . Dyslipidemia   . CAD (coronary artery disease)  Nuclear, January, 2010, no ischemia  . Hx of CABG     2007  . Cardiomyopathy     Ischemic  . ESRD (end stage renal disease)     Dialysis, right arm shunt  / PCI tissue, interventional radiology September, 2010 / temporary catheter left subclavian, new AV fistula right arm maturing March, 201 t2  . Diabetes mellitus   . Anemia     Chronic  . PAD (peripheral artery disease)     Right axillofemoral bypass, Dr. Arbie Cookey  . Aspirin allergy     Takes Plavix  . Hypertension     Past Surgical History  Procedure Date  . Coronary artery bypass graft  07/27/2006    [name]. [name], M.D  . Abdominal hysterectomy   . Laparoscopic salpingoopherectomy   . Appendectomy   . Fem-fem bypass 2003    left to right   . Right femoral bypass 2004  . Basal cell carcinoma excision 2004    REMOVAL FROM NOSE    ROS  Patient denies fever, chills, headache, sweats, rash, change in vision, change in hearing, chest pain, cough, nausea vomiting, urinary symptoms.  All other systems are reviewed and are negative  PHYSICAL EXAM Patient is  stable today.  She looks good.  Head is atraumatic.  There are bilateral carotid bruits.  Lungs are clear.  Respiratory effort is nonlabored.  Cardiac exam reveals an S1 and S2.  The abdomen is soft there is no peripheral edema.  There no musculoskeletal deformities.  Her dialysis catheter is in place in the left subclavian.  There no skin rashes. Filed Vitals:   02/13/11 1301  BP: 154/62  Pulse: 80  Height: 5\' 5"  (1.651 m)  Weight: 119 lb (53.978 kg)    EKG No EKG is done today.  ASSESSMENT & PLAN

## 2011-02-13 NOTE — Patient Instructions (Signed)
   Follow up as scheduled.  Take Carvedilol 6.25mg  1/2 tablet every morning and 1 tablet every evening for 1 week and then increase to 1 tablet daily.  Take Clonidine twice per day except do not take morning dose on dialysis days.  Your physician has requested that you have a carotid duplex. This test is an ultrasound of the carotid arteries in your neck. It looks at blood flow through these arteries that supply the brain with blood. Allow one hour for this exam. There are no restrictions or special instructions.

## 2011-02-13 NOTE — Assessment & Plan Note (Signed)
I want to keep working toward optimizing her medications for her cardiomyopathy.  At some point we will need to repeat her echo.  Her pressure had been low and her carvedilol dose decreased.  However she uses clonidine for hypertension.  Her blood pressure has been more stable recently.  Her heart rate is not low.  I will re\re increase her carvedilol back to 6.25 b.i.d.  She can cut back her clonidine as needed but it cannot be stopped completely in a rapid fashion.  I will have her plan to not take clonidine on the days that she gets home and takes her meds after her dialysis

## 2011-02-13 NOTE — Assessment & Plan Note (Signed)
She is tolerating dialysis relatively well.  I hope that her shunt can be used at some point.

## 2011-02-25 ENCOUNTER — Telehealth: Payer: Self-pay | Admitting: *Deleted

## 2011-02-25 NOTE — Telephone Encounter (Signed)
Message copied by Arlyss Gandy on Mon Feb 25, 2011  3:01 PM ------      Message from: Willa Rough D      Created: Sat Feb 23, 2011  4:41 PM       Please let her know that there is narrowing. We need follow up study in 6 months.

## 2011-02-25 NOTE — Telephone Encounter (Signed)
Left message to call back on voicemail regarding results.  

## 2011-03-05 ENCOUNTER — Encounter: Payer: Self-pay | Admitting: Cardiology

## 2011-03-08 NOTE — Telephone Encounter (Signed)
Pt notified of results and verbalized understanding.   A recall will be entered for carotid in 6 months.

## 2011-03-14 ENCOUNTER — Ambulatory Visit: Payer: Medicare Other | Admitting: Cardiology

## 2011-03-14 ENCOUNTER — Encounter: Payer: Self-pay | Admitting: Cardiology

## 2011-03-14 ENCOUNTER — Ambulatory Visit (INDEPENDENT_AMBULATORY_CARE_PROVIDER_SITE_OTHER): Payer: Medicare Other | Admitting: Cardiology

## 2011-03-14 DIAGNOSIS — I10 Essential (primary) hypertension: Secondary | ICD-10-CM

## 2011-03-14 DIAGNOSIS — I779 Disorder of arteries and arterioles, unspecified: Secondary | ICD-10-CM

## 2011-03-14 DIAGNOSIS — I5022 Chronic systolic (congestive) heart failure: Secondary | ICD-10-CM | POA: Insufficient documentation

## 2011-03-14 DIAGNOSIS — I2589 Other forms of chronic ischemic heart disease: Secondary | ICD-10-CM

## 2011-03-14 DIAGNOSIS — E785 Hyperlipidemia, unspecified: Secondary | ICD-10-CM

## 2011-03-14 DIAGNOSIS — Z79899 Other long term (current) drug therapy: Secondary | ICD-10-CM

## 2011-03-14 MED ORDER — CLONIDINE HCL 0.1 MG PO TABS: 0.1000 mg | ORAL_TABLET | Freq: Every day | ORAL | Status: DC

## 2011-03-14 MED ORDER — CARVEDILOL 12.5 MG PO TABS: 12.5000 mg | ORAL_TABLET | Freq: Two times a day (BID) | ORAL | Status: DC

## 2011-03-14 MED ORDER — PRAVASTATIN SODIUM 40 MG PO TABS: 40.0000 mg | ORAL_TABLET | Freq: Every evening | ORAL | Status: DC

## 2011-03-14 NOTE — Patient Instructions (Signed)
Follow up as scheduled. Your physician recommends that you go to the [facility] for a FASTING lipid profile and liver function labs. Do not eat or drink after midnight. Start Pravachol 40mg   Take 1 tablet by mouth every night. Increase Coreg (carvedilol) to 12.5 mg two times a day. You may take 2 of your 6.25 mg tablets twice per day until gone and then get new prescription filled for 12.5 mg two times a day. Decrease Clonidine to 0.1mg  every night.

## 2011-03-14 NOTE — Progress Notes (Signed)
HPI: Patient returns for early scheduled followup, following recent visit with Dr. Myrtis Ser on May 23. At that time, medications were adjusted with up titration of Coreg to 6.25 b.i.d. Carotid Dopplers were also ordered.  Clinically, the patient continues to do quite nicely, reporting no symptoms suggestive of decompensated heart failure. She remains in an ongoing process to make functional her RUE fistula, under the care of Dr. Tawanna Cooler Early. Carotid duplex imaging indicated 50-69% bilateral ICA stenosis.   Patient denies any exertional CP, DOE, PND, orthopnea, or significant LE edema.  Allergies  Allergen Reactions  . Aspirin   . Penicillins     Current Outpatient Prescriptions on File Prior to Visit  Medication Sig Dispense Refill  . ALPRAZolam (XANAX) 0.5 MG tablet Take 0.5 mg by mouth 3 (three) times daily as needed.        . carvedilol (COREG) 6.25 MG tablet Take 1 tablet (6.25 mg total) by mouth 2 (two) times daily with a meal.  60 tablet  6  . cloNIDine (CATAPRES) 0.1 MG tablet Take 1 tablet (0.1 mg total) by mouth 2 (two) times daily.  60 tablet  6  . clopidogrel (PLAVIX) 75 MG tablet Take 75 mg by mouth daily.        . folic acid-vitamin b complex-vitamin c-selenium-zinc (DIALYVITE) 3 MG TABS Take 1 tablet by mouth daily.        . furosemide (LASIX) 80 MG tablet Take 80 mg by mouth daily.        Marland Kitchen HYDROcodone-acetaminophen (LORTAB) 7.5-500 MG per tablet Take 1 tablet by mouth every 8 (eight) hours as needed.        . insulin NPH (HUMULIN N,NOVOLIN N) 100 UNIT/ML injection Inject 40 Units into the skin 2 (two) times daily. As needed      . isosorbide mononitrate (IMDUR) 30 MG 24 hr tablet Take 30 mg by mouth daily.        Marland Kitchen lisinopril (PRINIVIL,ZESTRIL) 20 MG tablet Take 20 mg by mouth daily.        . sevelamer (RENVELA) 800 MG tablet Take 6 tablets with meals and 3 tablets with snacks        Past Medical History  Diagnosis Date  . Mitral regurgitation     Mild-to-moderate, echo,  January, 2010 / moderate, e eccentric jet, echo, March, 2012  . Ejection fraction     40%, echo, January, 2010 / EF 20-25%, echo, hospital, June, 2011  / EF 25-30%, echo, March, 2012  . Dyslipidemia   . CAD (coronary artery disease)     Nuclear, January, 2010, no ischemia  . Hx of CABG     2007  . Cardiomyopathy     Ischemic  . ESRD (end stage renal disease)     Dialysis, right arm shunt  / PCI tissue, interventional radiology September, 2010 / temporary catheter left subclavian, new AV fistula right arm maturing March, 201 t2  . Diabetes mellitus   . Anemia     Chronic  . PAD (peripheral artery disease)     Right axillofemoral bypass, Dr. Arbie Cookey  . Aspirin allergy     Takes Plavix  . Hypertension   . Carotid bruit     May, 2011    Past Surgical History  Procedure Date  . Coronary artery bypass graft  07/27/2006    [name]. [name], M.D  . Abdominal hysterectomy   . Laparoscopic salpingoopherectomy   . Appendectomy   . Fem-fem bypass 2003    left  to right   . Right femoral bypass 2004  . Basal cell carcinoma excision 2004    REMOVAL FROM NOSE    History   Social History  . Marital Status: Married    Spouse Name: RALPH    Number of Children: N/A  . Years of Education: N/A   Occupational History  . RETIRED     [employer]   Social History Main Topics  . Smoking status: Former Smoker -- 2.0 packs/day for 50 years    Types: Cigarettes    Quit date: 09/23/2005  . Smokeless tobacco: Never Used   Comment: SMOKED FROM AGE 82 UNTIL 2007  . Alcohol Use: No     NO ALCOHOL SINCE THE 70'S  . Drug Use: Not on file  . Sexually Active: Not on file   Other Topics Concern  . Not on file   Social History Narrative  . No narrative on file    Family History  Problem Relation Age of Onset  . Diabetes Mother   . Kidney disease Father     ROS: Negative for exertional chest pain, DOE, orthopnea, PND, lower extremity edema, palpitations,  presyncope/syncope, claudication, reflux, hematuria, hematochezia, or melena. Remaining systems reviewed, and are negative.   PHYSICAL EXAM:  BP 129/67  Pulse 86  Ht 5\' 5"  (1.651 m)  Wt 117 lb (53.071 kg)  BMI 19.47 kg/m2  GENERAL: well-nourished, well-developed; NAD HEENT: NCAT, PERRLA, EOMI NECK: palpable bilateral carotid pulses,soft bilateral bruits (R > L); no JVD LUNGS: CTA bilaterally HEART: RRR (S1, S2); no significant murmurs, rubs, or gallops ABDOMEN: soft, non-tender; intact BS EXTREMETIES: palpable bilateral femoral pulses, no bruits; intact distal pulses; no significant peripheral edema SKIN: warm/dry; no obvious rash/lesions MUSCULOSKELETAL: no gross abnormality NEURO: A/O (x3); NL affect   EKG:    ASSESSMENT & PLAN:

## 2011-03-14 NOTE — Assessment & Plan Note (Signed)
uptitrate carvedilol to 12.5 b.i.d., and decrease clonidine to 0.1 q.h.s. Return clinic visit in one month, for reassessment and further med titration. Of note, we'll reassess LVF, following optimization of medical therapy.

## 2011-03-14 NOTE — Assessment & Plan Note (Signed)
Recent carotid duplex imaging yielded 50-69% bilateral ICA disease. Plan is for repeat study in 6 months.

## 2011-03-14 NOTE — Assessment & Plan Note (Signed)
Improved, continue monitoring closely.

## 2011-03-14 NOTE — Assessment & Plan Note (Signed)
euvolemic by history and physical examination. Continue current maintenance Lasix dose, with concomitant hemodialysis.

## 2011-03-14 NOTE — Assessment & Plan Note (Signed)
Reassess lipid status and resume treatment with statin. Patient reports having been on Pravachol in the past, and we will restart at 40 mg daily. Target LDL 70 or less, if feasiblel.

## 2011-03-21 NOTE — Progress Notes (Signed)
Her something for me to do?

## 2011-04-22 ENCOUNTER — Other Ambulatory Visit (HOSPITAL_COMMUNITY): Payer: Self-pay | Admitting: Nephrology

## 2011-04-22 DIAGNOSIS — N186 End stage renal disease: Secondary | ICD-10-CM

## 2011-04-24 ENCOUNTER — Ambulatory Visit: Payer: Medicare Other | Admitting: Cardiology

## 2011-04-24 ENCOUNTER — Encounter: Payer: Self-pay | Admitting: Cardiology

## 2011-04-29 ENCOUNTER — Ambulatory Visit (HOSPITAL_COMMUNITY)
Admission: RE | Admit: 2011-04-29 | Discharge: 2011-04-29 | Disposition: A | Discharge status: Home / Self Care | Payer: Medicare Other | Source: Ambulatory Visit | Attending: Nephrology | Admitting: Nephrology

## 2011-04-29 DIAGNOSIS — N186 End stage renal disease: Secondary | ICD-10-CM | POA: Insufficient documentation

## 2011-04-29 DIAGNOSIS — Z4901 Encounter for fitting and adjustment of extracorporeal dialysis catheter: Secondary | ICD-10-CM | POA: Insufficient documentation

## 2011-05-08 ENCOUNTER — Encounter: Payer: Self-pay | Admitting: Vascular Surgery

## 2011-05-14 ENCOUNTER — Ambulatory Visit (INDEPENDENT_AMBULATORY_CARE_PROVIDER_SITE_OTHER): Payer: Medicare Other | Admitting: Vascular Surgery

## 2011-05-14 ENCOUNTER — Encounter: Payer: Self-pay | Admitting: Vascular Surgery

## 2011-05-14 VITALS — BP 178/62 | HR 105 | Resp 20 | Ht 63.0 in | Wt 115.0 lb

## 2011-05-14 DIAGNOSIS — I7092 Chronic total occlusion of artery of the extremities: Secondary | ICD-10-CM

## 2011-05-14 NOTE — Progress Notes (Signed)
Subjective:     Patient ID: [name], female   DOB: 1941/05/07, 70 y.o.   MRN: [id number]  HPI The patient presents today for evaluation of right foot ischemia. She has a long history of right lower surety arterial insufficiency. She had undergone a right axillary to superficial femoral artery bypass with Gore-Tex graft in 2003. For the last 2 weeks she has noted severe claudication in her right calf and rest pain in her right foot. She has no tissue loss.  Review of Systems No change    Objective:   Physical Exam Well-developed well-nourished white female in no acute distress. She has a excellent thrill in her matured right upper arm AV fistula. She has an absent right femoral pulse. She has a 2+ left femoral pulse. She has absent pedal pulses bilaterally. She has decreased sensation in her right foot.  Vascular lab: Noncompressible tibial vessels therefore ABI not available.    Assessment:     Right foot ischemia with rest pain. Occluded right axillary to superficial femoral artery bypass. Unknown duration of bypass occlusion.    Plan:     The patient will undergo outpatient arteriogram tomorrow on August 22. Plan for revascularization will depend on the arteriogram. She dialyzes on Tuesday Thursday and Saturday.

## 2011-05-15 ENCOUNTER — Ambulatory Visit (HOSPITAL_COMMUNITY)
Admission: RE | Admit: 2011-05-15 | Discharge: 2011-05-15 | Disposition: A | Discharge status: Home / Self Care | Payer: Medicare Other | Source: Ambulatory Visit | Attending: Vascular Surgery | Admitting: Vascular Surgery

## 2011-05-15 DIAGNOSIS — I708 Atherosclerosis of other arteries: Secondary | ICD-10-CM | POA: Insufficient documentation

## 2011-05-15 DIAGNOSIS — I700 Atherosclerosis of aorta: Secondary | ICD-10-CM | POA: Insufficient documentation

## 2011-05-15 DIAGNOSIS — I70209 Unspecified atherosclerosis of native arteries of extremities, unspecified extremity: Secondary | ICD-10-CM | POA: Insufficient documentation

## 2011-05-15 DIAGNOSIS — I739 Peripheral vascular disease, unspecified: Secondary | ICD-10-CM

## 2011-05-15 DIAGNOSIS — Z79899 Other long term (current) drug therapy: Secondary | ICD-10-CM | POA: Insufficient documentation

## 2011-05-15 LAB — GLUCOSE, CAPILLARY: Glucose-Capillary: 102 mg/dL — ABNORMAL HIGH (ref 70–99)

## 2011-05-15 LAB — POCT I-STAT, CHEM 8
Creatinine, Ser: 4.8 mg/dL — ABNORMAL HIGH (ref 0.50–1.10)
Glucose, Bld: 149 mg/dL — ABNORMAL HIGH (ref 70–99)
Hemoglobin: 8.8 g/dL — ABNORMAL LOW (ref 12.0–15.0)
Potassium: 5.4 mEq/L — ABNORMAL HIGH (ref 3.5–5.1)

## 2011-05-20 ENCOUNTER — Inpatient Hospital Stay (HOSPITAL_COMMUNITY): Payer: Medicare Other

## 2011-05-20 ENCOUNTER — Inpatient Hospital Stay (HOSPITAL_COMMUNITY)
Admission: RE | Admit: 2011-05-20 | Discharge: 2011-05-22 | DRG: 252 | Disposition: A | Discharge status: Home / Self Care | Payer: Medicare Other | Source: Ambulatory Visit | Attending: Vascular Surgery | Admitting: Vascular Surgery

## 2011-05-20 DIAGNOSIS — I120 Hypertensive chronic kidney disease with stage 5 chronic kidney disease or end stage renal disease: Secondary | ICD-10-CM | POA: Diagnosis present

## 2011-05-20 DIAGNOSIS — Z841 Family history of disorders of kidney and ureter: Secondary | ICD-10-CM

## 2011-05-20 DIAGNOSIS — L98499 Non-pressure chronic ulcer of skin of other sites with unspecified severity: Secondary | ICD-10-CM

## 2011-05-20 DIAGNOSIS — Z7902 Long term (current) use of antithrombotics/antiplatelets: Secondary | ICD-10-CM

## 2011-05-20 DIAGNOSIS — I252 Old myocardial infarction: Secondary | ICD-10-CM

## 2011-05-20 DIAGNOSIS — I739 Peripheral vascular disease, unspecified: Secondary | ICD-10-CM

## 2011-05-20 DIAGNOSIS — N186 End stage renal disease: Secondary | ICD-10-CM | POA: Diagnosis present

## 2011-05-20 DIAGNOSIS — N2581 Secondary hyperparathyroidism of renal origin: Secondary | ICD-10-CM | POA: Diagnosis present

## 2011-05-20 DIAGNOSIS — Z992 Dependence on renal dialysis: Secondary | ICD-10-CM

## 2011-05-20 DIAGNOSIS — F17200 Nicotine dependence, unspecified, uncomplicated: Secondary | ICD-10-CM | POA: Diagnosis present

## 2011-05-20 DIAGNOSIS — J4489 Other specified chronic obstructive pulmonary disease: Secondary | ICD-10-CM | POA: Diagnosis present

## 2011-05-20 DIAGNOSIS — I5022 Chronic systolic (congestive) heart failure: Secondary | ICD-10-CM | POA: Diagnosis present

## 2011-05-20 DIAGNOSIS — E119 Type 2 diabetes mellitus without complications: Secondary | ICD-10-CM | POA: Diagnosis present

## 2011-05-20 DIAGNOSIS — I509 Heart failure, unspecified: Secondary | ICD-10-CM | POA: Diagnosis present

## 2011-05-20 DIAGNOSIS — J449 Chronic obstructive pulmonary disease, unspecified: Secondary | ICD-10-CM | POA: Diagnosis present

## 2011-05-20 DIAGNOSIS — D62 Acute posthemorrhagic anemia: Secondary | ICD-10-CM | POA: Diagnosis not present

## 2011-05-20 DIAGNOSIS — D649 Anemia, unspecified: Secondary | ICD-10-CM | POA: Diagnosis present

## 2011-05-20 DIAGNOSIS — I70409 Unspecified atherosclerosis of autologous vein bypass graft(s) of the extremities, unspecified extremity: Principal | ICD-10-CM | POA: Diagnosis present

## 2011-05-20 DIAGNOSIS — E876 Hypokalemia: Secondary | ICD-10-CM | POA: Diagnosis present

## 2011-05-20 LAB — URINALYSIS, ROUTINE W REFLEX MICROSCOPIC
Bilirubin Urine: NEGATIVE
Hgb urine dipstick: NEGATIVE
Nitrite: NEGATIVE
Protein, ur: 100 mg/dL — AB
Specific Gravity, Urine: 1.01 (ref 1.005–1.030)
Urobilinogen, UA: 0.2 mg/dL (ref 0.0–1.0)

## 2011-05-20 LAB — COMPREHENSIVE METABOLIC PANEL
ALT: <5 U/L (ref 0–35)
AST: 7 U/L (ref 0–37)
CO2: 24 mEq/L (ref 19–32)
Calcium: 10.9 mg/dL — ABNORMAL HIGH (ref 8.4–10.5)
Chloride: 102 mEq/L (ref 96–112)
Creatinine, Ser: 5.94 mg/dL — ABNORMAL HIGH (ref 0.50–1.10)
GFR calc Af Amer: 9 mL/min — ABNORMAL LOW (ref >60)
GFR calc non Af Amer: 7 mL/min — ABNORMAL LOW (ref >60)
Glucose, Bld: 82 mg/dL (ref 70–99)
Total Bilirubin: 0.3 mg/dL (ref 0.3–1.2)

## 2011-05-20 LAB — GLUCOSE, CAPILLARY
Glucose-Capillary: 70 mg/dL (ref 70–99)
Glucose-Capillary: 126 mg/dL — ABNORMAL HIGH (ref 70–99)

## 2011-05-20 LAB — CBC
MCHC: 31.5 g/dL (ref 30.0–36.0)
Platelets: 303 10*3/uL (ref 150–400)
RDW: 15.5 % (ref 11.5–15.5)
WBC: 7.3 10*3/uL (ref 4.0–10.5)

## 2011-05-20 LAB — BLOOD GAS, ARTERIAL
Drawn by: 344381
FIO2: 0.21 %
Patient temperature: 98.6
pCO2 arterial: 27.5 mmHg — ABNORMAL LOW (ref 35.0–45.0)
pH, Arterial: 7.49 — ABNORMAL HIGH (ref 7.350–7.400)

## 2011-05-20 LAB — SURGICAL PCR SCREEN
MRSA, PCR: NEGATIVE
Staphylococcus aureus: NEGATIVE

## 2011-05-20 LAB — URINE MICROSCOPIC-ADD ON

## 2011-05-20 LAB — TYPE AND SCREEN: Antibody Screen: NEGATIVE

## 2011-05-21 ENCOUNTER — Inpatient Hospital Stay (HOSPITAL_COMMUNITY): Payer: Medicare Other

## 2011-05-21 LAB — GLUCOSE, CAPILLARY
Glucose-Capillary: 116 mg/dL — ABNORMAL HIGH (ref 70–99)
Glucose-Capillary: 231 mg/dL — ABNORMAL HIGH (ref 70–99)
Glucose-Capillary: 200 mg/dL — ABNORMAL HIGH (ref 70–99)

## 2011-05-21 LAB — BASIC METABOLIC PANEL
Calcium: 9.8 mg/dL (ref 8.4–10.5)
GFR calc Af Amer: 7 mL/min — ABNORMAL LOW (ref >60)
GFR calc non Af Amer: 6 mL/min — ABNORMAL LOW (ref >60)
Glucose, Bld: 144 mg/dL — ABNORMAL HIGH (ref 70–99)
Potassium: 5.8 mEq/L — ABNORMAL HIGH (ref 3.5–5.1)
Sodium: 135 mEq/L (ref 135–145)

## 2011-05-21 LAB — CBC
MCH: 28.1 pg (ref 26.0–34.0)
MCHC: 31.3 g/dL (ref 30.0–36.0)
Platelets: 234 10*3/uL (ref 150–400)
RDW: 15.5 % (ref 11.5–15.5)

## 2011-05-21 LAB — HEMOGLOBIN A1C
Hgb A1c MFr Bld: 6.3 % — ABNORMAL HIGH (ref <5.7)
Mean Plasma Glucose: 134 mg/dL — ABNORMAL HIGH (ref <117)

## 2011-05-22 DIAGNOSIS — Z48812 Encounter for surgical aftercare following surgery on the circulatory system: Secondary | ICD-10-CM

## 2011-05-22 LAB — GLUCOSE, CAPILLARY: Glucose-Capillary: 148 mg/dL — ABNORMAL HIGH (ref 70–99)

## 2011-05-23 LAB — GLUCOSE, CAPILLARY

## 2011-05-24 NOTE — Consult Note (Signed)
[name], ESH NO.:  [id number]  MEDICAL RECORD NO.:  [id number]  LOCATION:  3308                         FACILITY:  [facility]  PHYSICIAN:  Terrial Rhodes, M.D.DATE OF BIRTH:  09/05/1941  DATE OF CONSULTATION:  05/20/2011 DATE OF DISCHARGE:                                CONSULTATION   CONSULTING PHYSICIAN:  Larina Earthly, MD  REASON FOR CONSULTATION:  Management of end-stage renal disease, hypertension, secondary hyperthyroidism, medical management following a vascular procedure.  HISTORY OF PRESENT ILLNESS:  Mrs. [name] is a 70 year old white female with an extensive past medical history significant for atherosclerotic vascular disease, ischemic cardiomyopathy, peripheral arterial disease, end-stage renal disease, on dialysis every Tuesday, Thursday, and Saturday at [facility] in [place], and anemia of chronic disease who was admitted by Dr. Arbie Cookey today for an elective right axillary fem bypass graft due to severe peripheral vascular disease.  She underwent an angiogram on May 15, 2011, which showed that her axillofemoral bypass was occluded and she had ischemic right foot.  She was brought back to the angio suite and attempted for opening.  She underwent the procedure today without difficulty and we have been asked to help manage her multiple medical issues including her end-stage renal disease.  ALLERGIES:  PENICILLIN, which causes anaphylaxis.  PAST MEDICAL HISTORY: 1. Coronary artery disease. 2. Ischemic cardiomyopathy with an EF of 25-30%. 3. Atherosclerotic vascular disease with history of right     axillofemoral bypass graft, that is status post clotted and in 2004     it was done and she had a fem-fem bypass in 2003, redo of her right     axillofemoral bypass. 4. Dyslipidemia. 5. Status post CABG. 6. Diabetes mellitus. 7. Anemia of chronic disease. 8. Secondary hyperparathyroid. 9. Hypertension. 10.Degenerative joint disease. 11.History  of carotid artery stenosis. 12.Mitral regurgitation, mild-to-moderate.  OUTPATIENT MEDICATIONS: 1. Xanax 0.5 mg t.i.d. p.r.n. 2. Coreg 12.5 mg b.i.d. 3. Sensipar 30 mg a day. 4. Clonidine 0.3 mg at bedtime. 5. Plavix 75 mg daily. 6. Dialyvite one daily. 7. Furosemide 80 mg daily. 8. Lortab p.r.n. 9. Insulin NPH 40 units b.i.d. 10.Imdur 30 mg a day. 11.Lisinopril 20 mg a day. 12.Pravachol 40 mg a day. 13.Renvela six tablets with meals three times a day. 14.EPO 3300 units with dialysis. 15.Hectorol 2.5 mcg with dialysis.  FAMILY HISTORY:  She also has a sister with end-stage renal disease.  SOCIAL HISTORY:  Was at home, retired, has a 90-pack-year tobacco history.  Denies alcohol.  REVIEW OF SYSTEMS:  Difficult because she is postoperative and main complaint is nausea.  CARDIAC:  No chest pain or palpitations. PULMONARY:  No shortness of breath, hemoptysis, or productive cough. GI:  As above.  GU:  No dysuria, pyuria, hematuria, urgency, frequency, or retention.  RHEUMATOLOGIC:  No arthralgias or myalgias.  HEMATOLOGIC: No abnormal bleeding or bruising.  All other systems are negative.  PHYSICAL EXAMINATION:  GENERAL:  Frail elderly female lying in bed and in mild distress complaining of nausea. VITAL SIGNS:  Afebrile, blood pressure 127/39, pulse of 88, respiratory rate of 33, pulse ox of 99%. HEENT:  Head is normocephalic and atraumatic.  No icterus.  Oropharynx;  dry mucous membranes. LUNGS:  Had occasional rhonchi, but otherwise clear to auscultation. CARDIAC:  Regular rate and rhythm.  No precordial rub appreciated. ABDOMEN:  Normoactive bowel sounds, soft, and nontender. EXTREMITIES:  There is no clubbing, cyanosis or edema and no ischemic changes.  She did have some mumbling and erythema over the right foot and pedal pulses could be Dopplered, but could not be palpated.  LABORATORY DATA:  Sodium 139, potassium 5.1, chloride 102, CO2 24, BUN 44, creatinine 5.94,  glucose 82.  LFTs were normal.  Calcium 10.9, albumin 3.4.  White blood cell count 7.3, hemoglobin 8.7, platelets 303.  ASSESSMENT AND PLAN: 1. Peripheral vascular disease, status post right axillary     femoropopliteal bypass graft per Dr. Arbie Cookey. 2. End-stage renal disease.  Continue with dialysis every Tuesday,     Thursday, and Saturday. 3. Anemia of chronic disease.  Continue with EPO. 4. Secondary hyperthyroidism with hypercalcemia.  She is on Sensipar     and Renvela, we will switch her to a low calcium bath with dialysis     and follow. 5. Coronary artery disease, stable. 6. Hypertension, stable.  Continue with medications. 7. Hyperlipidemia, continue with pravastatin. 8. Diabetes mellitus.  Continue with NPH.  We will hold her p.m. dose     should she is n.p.o. and follow up with the sliding scale.  We will     continue to follow along.  Thank you for this consultation.          ______________________________ Terrial Rhodes, M.D.     JC/MEDQ  D:  05/20/2011  T:  05/21/2011  Job:  [id number]  Electronically Signed by Terrial Rhodes M.D. on 05/24/2011 12:10:35 PM

## 2011-05-27 NOTE — Op Note (Signed)
[name], [name] NO.:  [id number]  MEDICAL RECORD NO.:  [id number]  LOCATION:  [facility]SC                         FACILITY:  [facility]  PHYSICIAN:  Fransisco Hertz, MD       DATE OF BIRTH:  1941/08/17  DATE OF PROCEDURE:  05/15/2011 DATE OF DISCHARGE:  05/15/2011                              OPERATIVE REPORT   PROCEDURES: 1. Left common femoral artery cannulation under ultrasound guidance. 2. Aortogram. 3. Bilateral leg runoff.  PREOPERATIVE DIAGNOSIS:  Right leg ischemia.  POSTOPERATIVE DIAGNOSIS:  Right leg ischemia.  SURGEON:  Fransisco Hertz, MD  ESTIMATED BLOOD LOSS:  Minimal.  ANESTHESIA:  Conscious sedation.  CONTRAST:  170 mL.  SPECIMENS:  None.  FINDINGS IN THIS CASE: 1. Severe left iliac artery stenosis as evident by difficulty passing     even a 4-French end-hole catheter through the left iliac system. 2. Small severely atherosclerotic aorta through that was patent. 3. There is a patent celiac artery and SMA. 4. Occluded right iliac arteries. 5. There is a patent left external iliac artery and common iliac     artery of occluded internal iliac artery.  Based on the imaging,     the left common or external iliac artery is being obturated by the     Omni flush catheter.  The imaging of this suggests left     distal common iliac artery stenosis that is severe as evident by     the obturation by the diagnostic catheter. 6. Extensive pelvic collateralization contribution from the inferior     mesenteric artery. 7. The right common femoral artery is not visualized and the right     profunda artery reconstitutes via collaterals via the pelvis. 8. The left common femoral artery is patent with also patent SFA and     profunda artery.  The left SFA is patent, but has extensive     atherosclerosis in it. 9. The right SFA is occluded. 10.The left popliteal artery is patent throughout. 11.The right popliteal artery reconstitutes at the level of the knee    with a below-the-knee popliteal artery patent. 12.The bilateral trifurcations are intact with extensive disease. 13.Left peroneal artery is a dominant runoff.  It, however, at the     level about the ankle fills the PT which continues down onto the     foot. 14.The right posterior tibial is a dominant runoff on the right side.  INDICATIONS:  This is a 70 year old female with known severe peripheral arterial disease, who previously has undergone a right axillary uni-femoral bypass as her previous femoral-femoral bypass left-to-right had gotten infected and required excision.  Unfortunately, her axillofemoral bypass is now occluded.  She presented in Dr. Bosie Helper clinic with continued ischemia in this right foot.  The patient is aware of the risks of this procedure include bleeding, infection, possible rupture of treated arteries, possible access site complication, and possible embolization.  She is aware of these risks and agreed to proceed forward.  DESCRIPTION OF PROCEDURE:  After full informed written consent was obtained from the patient, the patient was brought back to the angio suite and placed supine upon angio the table.  Prior to induction, she was connected to monitoring equipment and given conscious sedation, the amounts of which are documented in chart.  I turned my attention first to her left common femoral artery.  Under ultrasound guidance, I identified the common femoral artery which is small and diseased.  This was cannulated with micropuncture needle and I passed a microwire into it.  Note, the microwire would not pass into the aorta, but there was enough there to lodge a micro-sheath.  I was able to subsequently load the micro-sheath into the common femoral artery and distal external iliac artery.  It would not pass easily a Bentson wire, however, so there was obvious iliac artery stenosis.  I placed the end-hole catheter over this wire and using this combination, we  were able to get up into the aorta, but even passing the end-hole catheter was very difficult to get past the area of iliac stenosis with extensive amount of resistance.  Eventually, I was able to get into the aorta with the end-hole catheter and then exchanged the wire out for Amplatz wire, then exchanged out the sheath for a 5-French sheath which was loaded into the common femoral artery. I then with great difficulty was able to load the Omni flush catheter over the Amplatz wire up to level about L1.  Wire was then removed.  The catheter was connected to power injector circuit.  After performing declotting de-airing maneuver, the power injector aortogram findings are as listed above.  I then pulled down the catheter with great care given the extensive amount of atherosclerotic disease in aorta down to as far in the aorta as I could given the small size.  We then did an AP pelvis injection, the findings of which are listed above.  I then completed this patient's runoffs in stations given the severity of this patient's disease.  It was also evident note that even the small Omni flush catheter on the left side was obturating the arterial flow on this left side.  Given the severity of this the disease, I do not even think a low-profile angioplasty balloon will be able to navigate the left iliac system and that any attempt to angioplasty this will likely result in rupture of the artery due to severe calcification on this side. As endovascular intervention on the left iliac system would require  relining the entirety of this left iliac system and given its relatively  small size, we would need to use a long small stents in the left iliac  system which is not compatible with long-term patency.  Subsequently, I did  not feel that endovascular intervention was indicated in this case.  Based  on the imaging, this patient needs to have either an aortobifemoral bypass  or a left axillary  femoral-femoral bypass.  At this point, I placed with care  the Bentson back into the Omni flush catheter, straightened out the catheter, and pulled out the catheter and wire after a straightening out the crook of the  catheter, and then I aspirated out the left sheath.  There was still persistent  blood flow.  I then instilled heparinized saline.  The sheath was pulled on the table.  Pressure was held for about 15-20 minutes.  The patient tolerated the procedure fine.  COMPLICATIONS:  None.  CONDITION:  Stable.     Fransisco Hertz, MD     BLC/MEDQ  D:  05/15/2011  T:  05/16/2011  Job:  [id number]  Electronically Signed by Leonides Sake MD  on 05/27/2011 07:32:00 PM

## 2011-06-10 ENCOUNTER — Encounter: Payer: Self-pay | Admitting: Vascular Surgery

## 2011-06-11 ENCOUNTER — Ambulatory Visit (INDEPENDENT_AMBULATORY_CARE_PROVIDER_SITE_OTHER): Payer: Medicare Other | Admitting: Vascular Surgery

## 2011-06-11 ENCOUNTER — Encounter: Payer: Self-pay | Admitting: Vascular Surgery

## 2011-06-11 VITALS — BP 157/66 | HR 97 | Resp 16 | Ht 63.0 in | Wt 115.0 lb

## 2011-06-11 DIAGNOSIS — I7092 Chronic total occlusion of artery of the extremities: Secondary | ICD-10-CM

## 2011-06-11 NOTE — Progress Notes (Signed)
The patient presents today for followup of her right axillary to femoral bypass on 05/20/2011. She has resolved her postoperative soreness. She had severe pain in her right foot prior to surgery and this pain has resolved she does have some numbness which is resolving. She has no rest pain.  Physical exam: Well-healed right axillary and groin incision with 3+ graft pulse. She is a well perfused right foot.  Impression and plan: Successful treatment of critical ischemia right foot with axillary to femoral bypass. She will continue her usual dialysis schedule and followup with [place] on an as-needed basis.

## 2011-06-11 NOTE — Op Note (Signed)
  NAMEAERYN, [name] NO.:  [id number]  MEDICAL RECORD NO.:  [id number]  LOCATION:  3308                         FACILITY:  [facility]  PHYSICIAN:  Larina Earthly, M.D.    DATE OF BIRTH:  1940-11-05  DATE OF PROCEDURE:  05/20/2011 DATE OF DISCHARGE:                              OPERATIVE REPORT   PREOPERATIVE DIAGNOSIS:  Right foot ischemia with rest pain.  POSTOPERATIVE DIAGNOSIS:  Right foot ischemia with rest pain.  PROCEDURE:  Right redo axillary to femoral bypass with 8-mm Hemashield graft.  SURGEON:  Larina Earthly, MD  ASSISTANT:  Newton Pigg, PA.  ANESTHESIA:  General endotracheal.  COMPLICATIONS:  None.  Returned to recovery room stable.  PROCEDURE IN DETAIL:  The patient was taken to operating room and placed in supine position.  The right axillary area, right chest, and right groin and right leg were prepped and draped in usual sterile fashion. Incision was made through an old scar and carried down to isolate the old Gore-Tex graft to the axillary artery.  The artery was exposed proximally and distally.  Separate incision was made over the groin and carried down to isolate the common, superficial femoral, and fundus femoris arteries.  These were encircled with vessel loop.  The tunnel was created from level of the groin to the axillary space.  The patient was given 5000 intravenous heparin.  After adequate circulation time, the axillary artery was occluded proximally and distally with old graft. The old graft was transected at the old anastomosis.  This area was widely patent at the old anastomosis.  An 8-mm Hemashield graft was brought into the field and was sewn end-to-end to the old Gore-Tex graft with a small cuff left at the axillary artery.  Sutured with running 6-0 Prolene suture.  The anastomosis was tested and found to be adequate. The graft was then brought through the prior created tunnel down to the level of the right common  femoral artery.  The common superficial femoral arteries were occluded.  The common femoral arteries opened with an 11-blade and two Potts scissors onto the profundus femoris artery, which was the larger vessel by arteriogram.  There was no inflow back from the common femoral artery.  The graft was cut to the appropriate length and sewn end-to-side to the artery with a running 6-0 Prolene suture.  The anastomosis was tested and found to be adequate.  Clamps were removed and excellent flow was noted through the graft by Doppler and also graft Doppler flow at the anterior tibial and posterior tibial arteries.  The patient was given 50 mg protamine to reverse heparin. The wound was irrigated with saline and hemostasis with electrocautery.  Wound was closed with 3- 0 Vicryl in the skin.  The fascia was closed with 2-0 Vicryl and the skin was closed with a 3-0 subcuticular Vicryl stitch.  Sterile dressing was applied and the patient was taken to the recovery room stable.     Larina Earthly, M.D.     TFE/MEDQ  D:  05/20/2011  T:  05/20/2011  Job:  [id number]  Electronically Signed by Ozie Lupe M.D. on 06/11/2011 01:10:56 PM

## 2011-06-11 NOTE — Discharge Summary (Signed)
[name], ROBBEN NO.:  [id number]  MEDICAL RECORD NO.:  [id number]  LOCATION:  3308                         FACILITY:  [facility]  PHYSICIAN:  Larina Earthly, M.D.    DATE OF BIRTH:  [birth year]  DATE OF ADMISSION:  05/20/2011 DATE OF DISCHARGE:  05/22/2011                              DISCHARGE SUMMARY   ADMISSION DIAGNOSIS:  Right foot ischemia.  HISTORY OF PRESENT ILLNESS:  This is a 70 year old female who presented to the office for evaluation of right foot ischemia.  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  She has undergone a right axillary to superficial femoral artery bypass with Gore-Tex graft in 2003.  For the last 2 weeks she has noticed severe claudication in her right calf and rest pain in her right foot.  She has no tissue loss.  HOSPITAL COURSE:  The patient was admitted to the hospital and taken to the operating room where she underwent a right axillary artery to rightfemoral artery bypass with 8-mm Dacron graft.  She tolerated the procedure well and was transported to the recovery room in satisfactory condition.  That afternoon her right foot well perfused with no rest pain.  Nephrology consult was also obtained so that she could receive her hemodialysis.  Otherwise, her postoperative course included increasing ambulation as well as increasing intake of solids without difficulty.  DISCHARGE INSTRUCTIONS:  She is discharged home with extensive instructions on wound care and progressive ambulation.  She is instructed not to drive or perform any heavy lifting for 1 month.  DISCHARGE DIAGNOSES: 1. Right foot ischemia.     a.     Status post right axillary artery to right femoral artery      bypass on May 20, 2011. 2. End-stage renal disease on hemodialysis. 3. Mitral regurgitation, ejection fraction of 40%. 4. Coronary artery disease. 5. Dyslipidemia. 6. Ischemic cardiomyopathy. 7. Diabetes. 8. Chronic anemia. 9.  Hypertension. 10.History of carotid bruits. 11.Congestive heart failure. 12.History of myocardial infarction. 13.Arthritis. 14.History of fem-fem bypass in 2003. 15.History of right femoral bypass in 2004. 16.History of basal cell carcinoma excision. 17.History of laparoscopic salpingo-oophorectomy. 18.History of abdominal hysterectomy.  DISCHARGE MEDICATIONS: 1. Hydrocodone 7.5/500 one p.o. q. 4-6 h. p.r.n. pain #30 no refill.     a.     The patient is currently on this medication at home t.i.d. 2. Coreg 12.5 mg p.o. b.i.d. 3. Clonidine 0.1 mg p.o. b.i.d. 4. Lasix 80 mg p.o. daily. 5. Imdur 30 mg p.o. daily. 6. Lisinopril 20 mg p.o. daily. 7. Novolin N 40 units subcu b.i.d. 8. Plavix 75 mg p.o. daily. 9. Pravachol 40 mg p.o. daily. 10.Renvela 0.8 g 1 packet t.i.d. 11.Sensipar 30 mg p.o. daily. 12.Xanax 0.5 mg p.o. t.i.d.  Of note, the patient is on hemodialysis Tuesday, Thursday, Saturday.  FOLLOWUP:  The patient should follow up with Dr. Arbie Cookey in 2-3 weeks.     Newton Pigg, PA   ______________________________ Larina Earthly, M.D.    SE/MEDQ  D:  05/22/2011  T:  05/22/2011  Job:  [id number]  Electronically Signed by Newton Pigg PA on 05/22/2011 01:54:32 PM Electronically Signed by Sharice Harriss  M.D. on 06/11/2011 01:10:54 PM

## 2011-06-19 IMAGING — XA IR US GUIDE VASC ACCESS RIGHT
1 series · 12 of 24 positions shown · non-contrast
Comparison: None available

CLINICAL DATA: Occluded dialysis graft.  Venous angioplasty
05/31/2009.

DIALYSIS GRAFT DECLOT
VENOUS ANGIOPLASTY
ULTRASOUND GUIDANCE FOR VASCULAR ACCESS X2
TECHNIQUE: The arterial limb of the graft was accessed antegrade
with a 21-gauge micropuncture needle under real-time ultrasonic
guidance after the overlying skin prepped with Betadine, draped in
usual sterile fashion, infiltrated locally with 1% lidocaine.
Needle exchanged over 018 guidewire for transitional dilator
through which 1 mg t-PA was administered. In similar fashion, the
venous limb was accessed retrograde under ultrasound with a
micropuncture needle, exchanged for a transitional dilator for
administration of 1 mg t-PA. Ultrasound imaging documentation was
saved. Through the antegrade dilator, a Bentson wire was advanced
to the venous anastomosis. Over this a 6F sheath was placed,
through which a 5 French Kumpe catheter was advanced for outflow
venography. This showed patency of the outflow  venous system
through the SVC. 5000 units heparin were administered. The Arrow
PTD device was used to macerate thrombus in the graft. Injection
showed   clearance of thrombus from the graft. Balloon angioplasty
of the   venous anastomosis was performed using a 7 mm x 4 cm
Abe angioplasty balloon with good response.  Balloon was removed
and injection showed patency of the anastomosis, without
extravasation or other apparent complication.
The venous limb dilator was exchanged in similar fashion for a 6
French vascular sheath. The PTD device was advanced across the
arterial anastomosis and used to dislodge the platelet plug  into
the arterial limb of the graft, where it was macerated. The PTD
device was used again to remove any residual platelet plug from the
arterial anastomosis.  A follow shuntogram was performed,
demonstrating good flow through the outflow vein, no extravasation.
Mild residual stenosis at and central to the venous anastomosis
responded well to additional 7 mm balloon angioplasty. Reflux
across the arterial anastomosis demonstrate this to be widely
patent, with unremarkable appearance of the visualized native
arterial circulation. The catheter and sheaths were then removed
and hemostasis achieved with 2-0 Ethilon sutures. Patient tolerated
procedure well.
IMPRESSION
1. Technically successful declot of right forearm loop synthetic
hemodialysis graft.
2. Technically successful balloon angioplasty of venous anastomotic
stenosis.
Access management: Remains amenable to percutaneous intervention as
needed.

[Series 1: run · 12 of 45 slices shown]
[im 2/45]
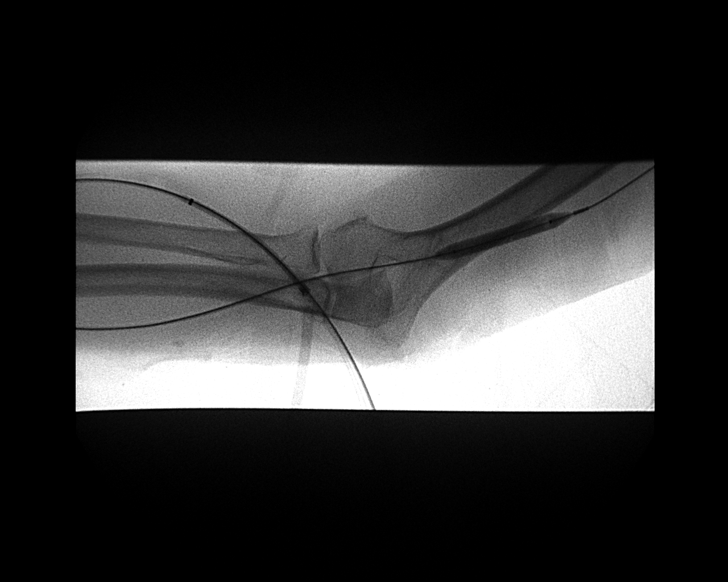
[im 6/45]
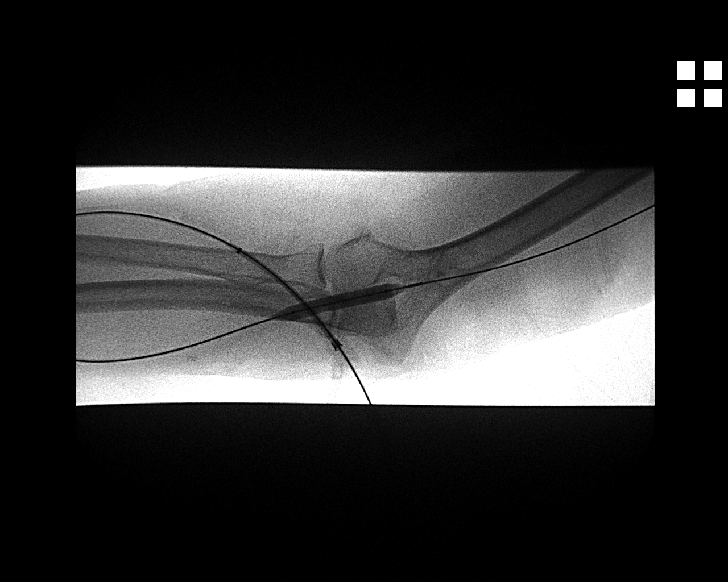
[im 10/45]
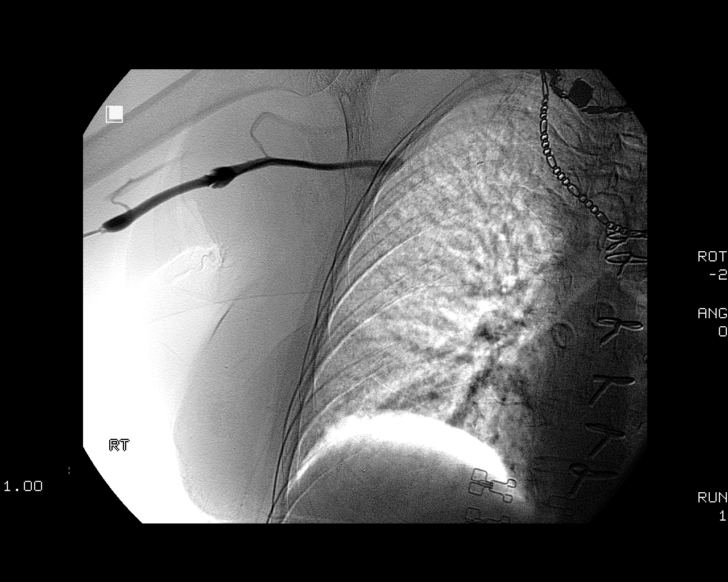
[im 14/45]
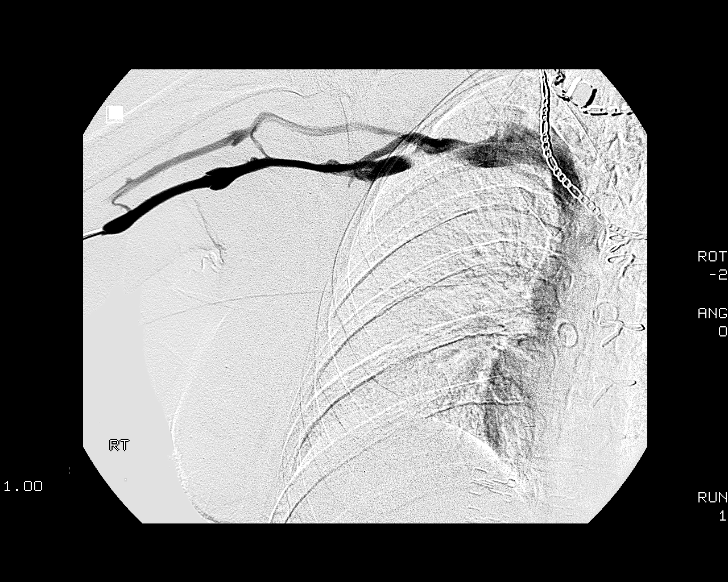
[im 18/45]
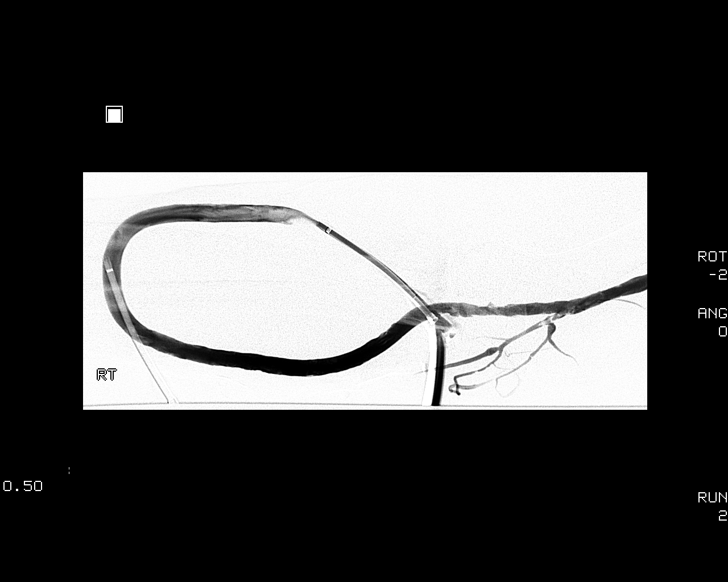
[im 22/45]
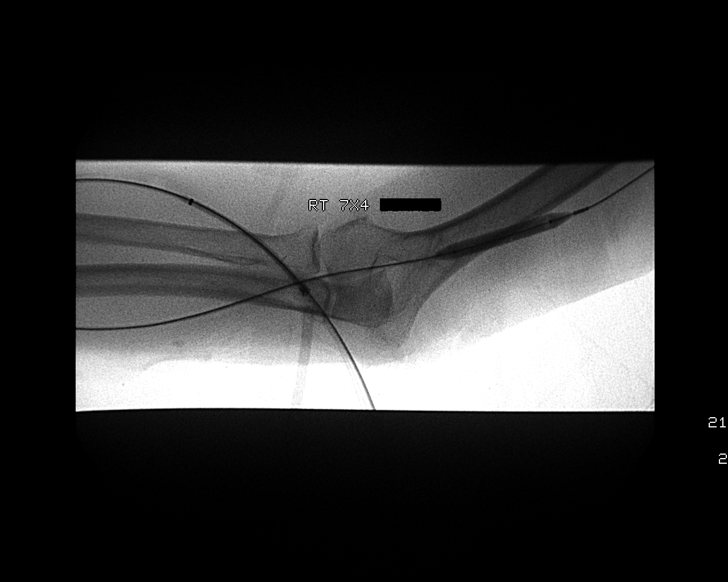
[im 25/45]
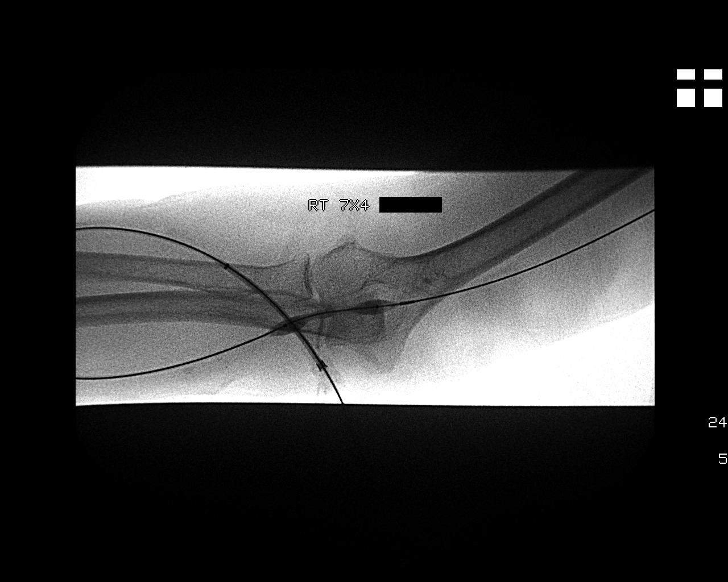
[im 29/45]
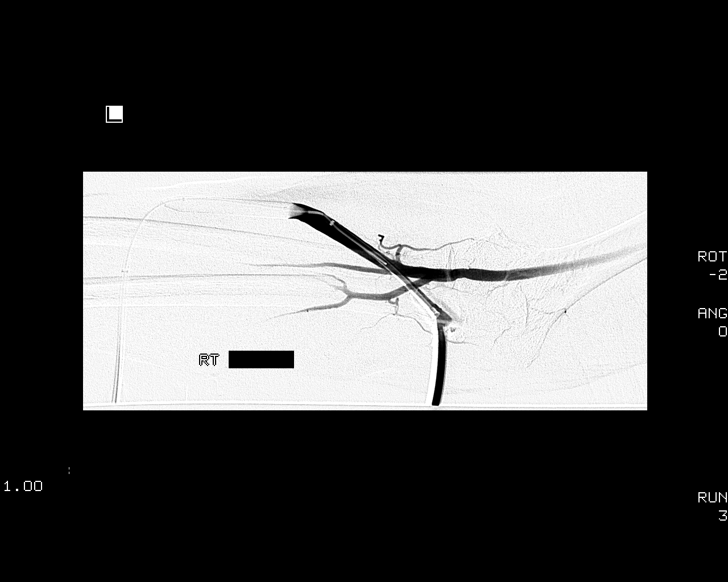
[im 33/45]
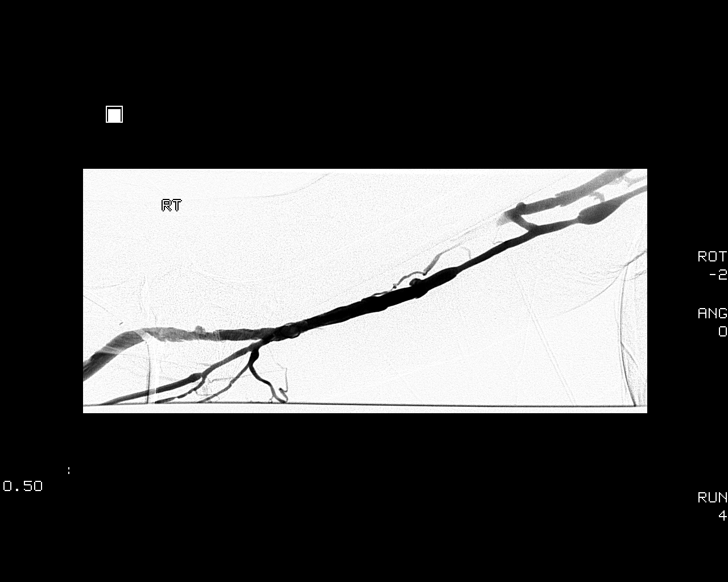
[im 37/45]
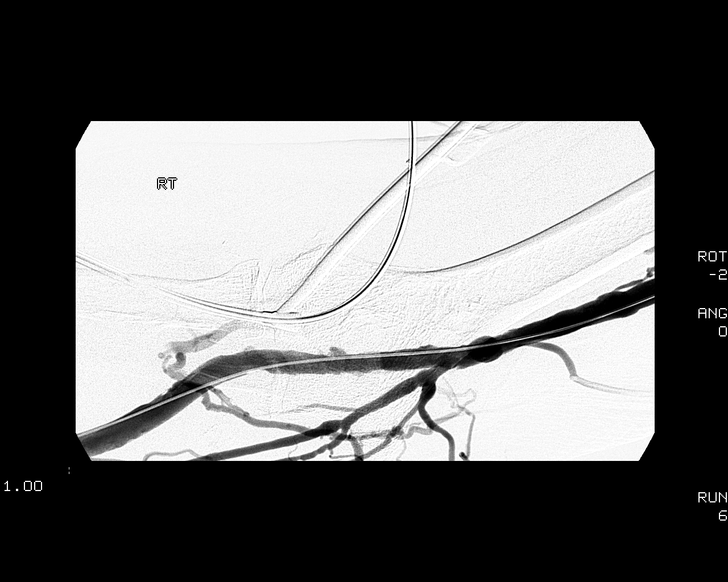
[im 41/45]
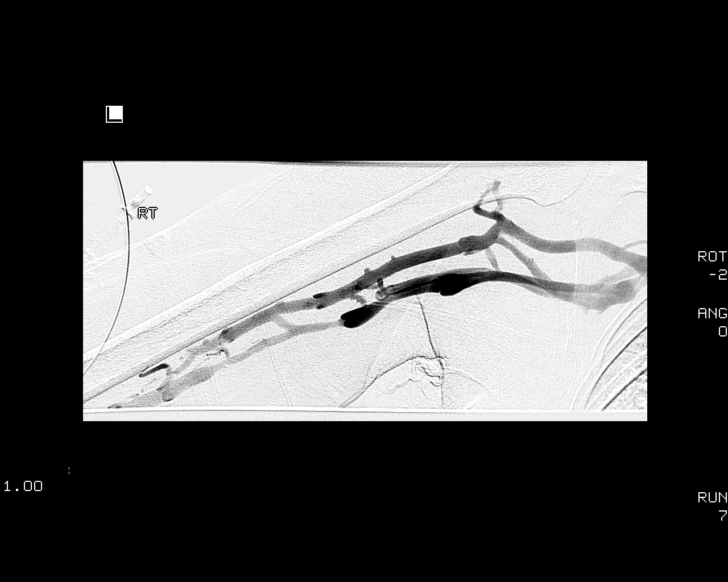
[im 45/45]
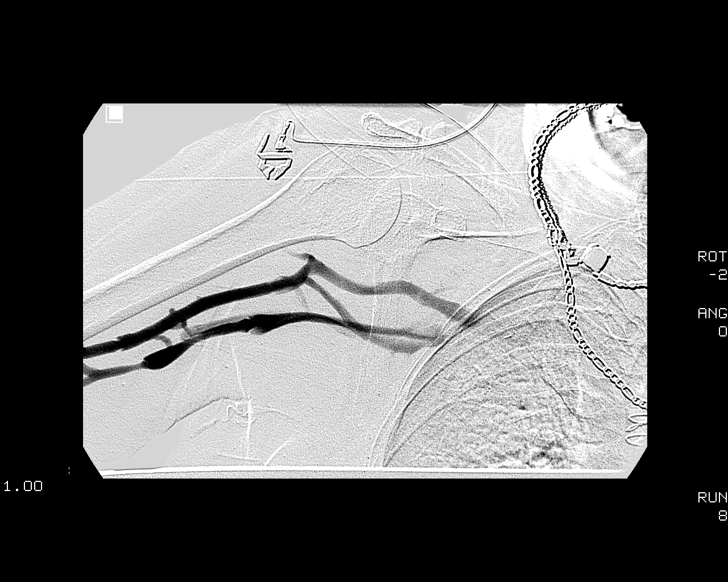

[12 of 24 positions shown; findings below may reference images not displayed]

## 2011-08-07 ENCOUNTER — Inpatient Hospital Stay (HOSPITAL_COMMUNITY): Payer: Medicare Other

## 2011-08-07 ENCOUNTER — Inpatient Hospital Stay (HOSPITAL_COMMUNITY)
Admission: AD | Admit: 2011-08-07 | Discharge: 2011-08-09 | DRG: 302 | Disposition: A | Discharge status: Home / Self Care | Payer: Medicare Other | Source: Other Acute Inpatient Hospital | Attending: Cardiology | Admitting: Cardiology

## 2011-08-07 ENCOUNTER — Encounter (HOSPITAL_COMMUNITY): Payer: Self-pay | Admitting: Emergency Medicine

## 2011-08-07 DIAGNOSIS — I509 Heart failure, unspecified: Secondary | ICD-10-CM | POA: Diagnosis present

## 2011-08-07 DIAGNOSIS — N2581 Secondary hyperparathyroidism of renal origin: Secondary | ICD-10-CM | POA: Diagnosis present

## 2011-08-07 DIAGNOSIS — Z951 Presence of aortocoronary bypass graft: Secondary | ICD-10-CM

## 2011-08-07 DIAGNOSIS — I059 Rheumatic mitral valve disease, unspecified: Secondary | ICD-10-CM | POA: Diagnosis present

## 2011-08-07 DIAGNOSIS — I2589 Other forms of chronic ischemic heart disease: Principal | ICD-10-CM | POA: Diagnosis present

## 2011-08-07 DIAGNOSIS — D649 Anemia, unspecified: Secondary | ICD-10-CM | POA: Insufficient documentation

## 2011-08-07 DIAGNOSIS — I10 Essential (primary) hypertension: Secondary | ICD-10-CM | POA: Diagnosis present

## 2011-08-07 DIAGNOSIS — M549 Dorsalgia, unspecified: Secondary | ICD-10-CM | POA: Diagnosis present

## 2011-08-07 DIAGNOSIS — R0602 Shortness of breath: Secondary | ICD-10-CM

## 2011-08-07 DIAGNOSIS — N186 End stage renal disease: Secondary | ICD-10-CM | POA: Diagnosis present

## 2011-08-07 DIAGNOSIS — R05 Cough: Secondary | ICD-10-CM | POA: Diagnosis present

## 2011-08-07 DIAGNOSIS — N039 Chronic nephritic syndrome with unspecified morphologic changes: Secondary | ICD-10-CM | POA: Diagnosis present

## 2011-08-07 DIAGNOSIS — I5022 Chronic systolic (congestive) heart failure: Secondary | ICD-10-CM | POA: Diagnosis present

## 2011-08-07 DIAGNOSIS — I080 Rheumatic disorders of both mitral and aortic valves: Secondary | ICD-10-CM | POA: Diagnosis present

## 2011-08-07 DIAGNOSIS — Z992 Dependence on renal dialysis: Secondary | ICD-10-CM

## 2011-08-07 DIAGNOSIS — I2510 Atherosclerotic heart disease of native coronary artery without angina pectoris: Secondary | ICD-10-CM | POA: Diagnosis present

## 2011-08-07 DIAGNOSIS — I120 Hypertensive chronic kidney disease with stage 5 chronic kidney disease or end stage renal disease: Secondary | ICD-10-CM | POA: Diagnosis present

## 2011-08-07 DIAGNOSIS — E785 Hyperlipidemia, unspecified: Secondary | ICD-10-CM | POA: Diagnosis present

## 2011-08-07 DIAGNOSIS — D631 Anemia in chronic kidney disease: Secondary | ICD-10-CM | POA: Diagnosis present

## 2011-08-07 HISTORY — DX: Anxiety disorder, unspecified: F41.9

## 2011-08-07 LAB — CBC
HCT: 32.7 % — ABNORMAL LOW (ref 36.0–46.0)
Hemoglobin: 10.2 g/dL — ABNORMAL LOW (ref 12.0–15.0)
MCH: 28.1 pg (ref 26.0–34.0)
MCHC: 31.2 g/dL (ref 30.0–36.0)
MCV: 90.1 fL (ref 78.0–100.0)
RDW: 16.2 % — ABNORMAL HIGH (ref 11.5–15.5)

## 2011-08-07 LAB — GLUCOSE, CAPILLARY
Glucose-Capillary: 175 mg/dL — ABNORMAL HIGH (ref 70–99)
Glucose-Capillary: 211 mg/dL — ABNORMAL HIGH (ref 70–99)
Glucose-Capillary: 86 mg/dL (ref 70–99)

## 2011-08-07 LAB — COMPREHENSIVE METABOLIC PANEL
Albumin: 3 g/dL — ABNORMAL LOW (ref 3.5–5.2)
Alkaline Phosphatase: 54 U/L (ref 39–117)
BUN: 29 mg/dL — ABNORMAL HIGH (ref 6–23)
Calcium: 9.9 mg/dL (ref 8.4–10.5)
Creatinine, Ser: 3.84 mg/dL — ABNORMAL HIGH (ref 0.50–1.10)
GFR calc Af Amer: 13 mL/min — ABNORMAL LOW (ref >90)
Glucose, Bld: 194 mg/dL — ABNORMAL HIGH (ref 70–99)
Total Protein: 6.3 g/dL (ref 6.0–8.3)

## 2011-08-07 LAB — APTT: aPTT: 35 seconds (ref 24–37)

## 2011-08-07 LAB — DIFFERENTIAL
Basophils Absolute: 0 10*3/uL (ref 0.0–0.1)
Basophils Relative: 1 % (ref 0–1)
Eosinophils Absolute: 0.1 10*3/uL (ref 0.0–0.7)
Eosinophils Relative: 2 % (ref 0–5)
Monocytes Absolute: 0.4 10*3/uL (ref 0.1–1.0)
Monocytes Relative: 9 % (ref 3–12)
Neutro Abs: 2.7 10*3/uL (ref 1.7–7.7)

## 2011-08-07 LAB — HEMOGLOBIN A1C
Hgb A1c MFr Bld: 6.7 % — ABNORMAL HIGH (ref <5.7)
Mean Plasma Glucose: 146 mg/dL — ABNORMAL HIGH (ref <117)

## 2011-08-07 LAB — CARDIAC PANEL(CRET KIN+CKTOT+MB+TROPI)
CK, MB: 2.5 ng/mL (ref 0.3–4.0)
Relative Index: INVALID (ref 0.0–2.5)
Relative Index: INVALID (ref 0.0–2.5)
Relative Index: INVALID (ref 0.0–2.5)
Total CK: 30 U/L (ref 7–177)
Troponin I: <0.3 ng/mL (ref <0.30)
Troponin I: <0.3 ng/mL (ref <0.30)

## 2011-08-07 MED ORDER — HYDROCODONE-ACETAMINOPHEN 5-325 MG PO TABS: 1.0000 | ORAL_TABLET | Freq: Four times a day (QID) | ORAL | Status: DC | PRN

## 2011-08-07 MED ORDER — LISINOPRIL 20 MG PO TABS
20.0000 mg | ORAL_TABLET | Freq: Every day | ORAL | Status: DC
  Administered 2011-08-07: 20 mg via ORAL
  Filled 2011-08-07 (×2): qty 1

## 2011-08-07 MED ORDER — CLONIDINE HCL 0.1 MG PO TABS
0.1000 mg | ORAL_TABLET | Freq: Every day | ORAL | Status: DC
  Administered 2011-08-07: 0.1 mg via ORAL
  Filled 2011-08-07 (×2): qty 1

## 2011-08-07 MED ORDER — ONDANSETRON HCL 4 MG/2ML IJ SOLN: 4.0000 mg | Freq: Four times a day (QID) | INTRAMUSCULAR | Status: DC | PRN

## 2011-08-07 MED ORDER — CLOPIDOGREL BISULFATE 75 MG PO TABS: 75.0000 mg | ORAL_TABLET | Freq: Every day | ORAL | Status: DC

## 2011-08-07 MED ORDER — SODIUM CHLORIDE 0.9 % IV SOLN: 250.0000 mL | INTRAVENOUS | Status: DC

## 2011-08-07 MED ORDER — INSULIN ASPART 100 UNIT/ML ~~LOC~~ SOLN
0.0000 [IU] | Freq: Three times a day (TID) | SUBCUTANEOUS | Status: DC
  Administered 2011-08-07: 2 [IU] via SUBCUTANEOUS
  Filled 2011-08-07: qty 3

## 2011-08-07 MED ORDER — ALPRAZOLAM 0.5 MG PO TABS
0.5000 mg | ORAL_TABLET | Freq: Three times a day (TID) | ORAL | Status: DC | PRN
  Administered 2011-08-08: 0.5 mg via ORAL
  Filled 2011-08-07: qty 1

## 2011-08-07 MED ORDER — CLOPIDOGREL BISULFATE 75 MG PO TABS
75.0000 mg | ORAL_TABLET | Freq: Every day | ORAL | Status: DC
  Administered 2011-08-07 – 2011-08-09 (×3): 75 mg via ORAL
  Filled 2011-08-07 (×3): qty 1

## 2011-08-07 MED ORDER — FUROSEMIDE 80 MG PO TABS
80.0000 mg | ORAL_TABLET | Freq: Every day | ORAL | Status: DC
  Administered 2011-08-07: 80 mg via ORAL
  Filled 2011-08-07 (×2): qty 1

## 2011-08-07 MED ORDER — INSULIN NPH (HUMAN) (ISOPHANE) 100 UNIT/ML ~~LOC~~ SUSP
30.0000 [IU] | Freq: Two times a day (BID) | SUBCUTANEOUS | Status: DC
  Filled 2011-08-07: qty 10

## 2011-08-07 MED ORDER — ISOSORBIDE MONONITRATE ER 30 MG PO TB24
30.0000 mg | ORAL_TABLET | Freq: Every day | ORAL | Status: DC
  Administered 2011-08-07: 30 mg via ORAL
  Filled 2011-08-07 (×2): qty 1

## 2011-08-07 MED ORDER — SEVELAMER CARBONATE 800 MG PO TABS
1600.0000 mg | ORAL_TABLET | Freq: Three times a day (TID) | ORAL | Status: DC
  Administered 2011-08-07 – 2011-08-09 (×7): 1600 mg via ORAL
  Filled 2011-08-07 (×10): qty 2

## 2011-08-07 MED ORDER — ACETAMINOPHEN 325 MG PO TABS: 650.0000 mg | ORAL_TABLET | ORAL | Status: DC | PRN

## 2011-08-07 MED ORDER — NITROGLYCERIN 0.4 MG SL SUBL: 0.4000 mg | SUBLINGUAL_TABLET | SUBLINGUAL | Status: DC | PRN

## 2011-08-07 MED ORDER — CINACALCET HCL 30 MG PO TABS
30.0000 mg | ORAL_TABLET | Freq: Every day | ORAL | Status: DC
  Administered 2011-08-07: 30 mg via ORAL
  Filled 2011-08-07 (×2): qty 1

## 2011-08-07 MED ORDER — SODIUM CHLORIDE 0.9 % IJ SOLN: 3.0000 mL | INTRAMUSCULAR | Status: DC | PRN

## 2011-08-07 MED ORDER — SIMVASTATIN 20 MG PO TABS
20.0000 mg | ORAL_TABLET | Freq: Every day | ORAL | Status: DC
  Administered 2011-08-07 – 2011-08-08 (×2): 20 mg via ORAL
  Filled 2011-08-07 (×3): qty 1

## 2011-08-07 MED ORDER — SODIUM CHLORIDE 0.9 % IJ SOLN: 3.0000 mL | Freq: Two times a day (BID) | INTRAMUSCULAR | Status: DC

## 2011-08-07 MED ORDER — INSULIN NPH (HUMAN) (ISOPHANE) 100 UNIT/ML ~~LOC~~ SUSP
30.0000 [IU] | Freq: Two times a day (BID) | SUBCUTANEOUS | Status: DC
  Administered 2011-08-07: 30 [IU] via SUBCUTANEOUS

## 2011-08-07 MED ORDER — CARVEDILOL 12.5 MG PO TABS
12.5000 mg | ORAL_TABLET | Freq: Two times a day (BID) | ORAL | Status: DC
  Administered 2011-08-07 (×2): 12.5 mg via ORAL
  Filled 2011-08-07 (×5): qty 1

## 2011-08-07 NOTE — Progress Notes (Signed)
Inpatient Diabetes Program Recommendations  AACE/ADA: New Consensus Statement on Inpatient Glycemic Control (2009)  Target Ranges:  Prepandial:   less than 140 mg/dL      Peak postprandial:   less than 180 mg/dL (1-2 hours)      Critically ill patients:  140 - 180 mg/dL   Reason for Visit: CBG's today are 95 and 86 mg/dL.  Pt. Has not received any NPH yet.  Inpatient Diabetes Program Recommendations Insulin - Basal: CBG's today are 95 and 86 mg/dL. Consider holding NPH for now.  Note:

## 2011-08-07 NOTE — Progress Notes (Signed)
  I've seen the patient on rounds. She was admitted after midnight. I know this patient well. She did have dialysis yesterday. Her back pain and cough are somewhat better today. She is lying flat in bed. I will plan to keep her on her regular dialysis schedule which will be tomorrow. I will contact the nephrology team here the hospital. Usually her dialysis is done in [place]. It is possible that the majority of the problem is her recent cough with sputum and pain. However will be sure that her overall cardiac status has not deteriorated.

## 2011-08-07 NOTE — Progress Notes (Signed)
Patient has been advised of our policy on valuables and has decided to keep them with her.  Patient has dentures uppers and lowers and has a white colored cell phone.  Patient also has two silver colored earrings.

## 2011-08-07 NOTE — H&P (Signed)
Cardiology H&P  Primary Care Povider: Selinda Flavin Primary Cardiologist: Myrtis Ser   HPI: Ms. Brunell is a 70 y.o.female with CAD and ischemic cardiomyopathy as well as ESRD on HD x 3 years who is transferred from [facility] with concerns for ACS.  She reports 3 weeks of cough and SOB without fever or other constitutional symptoms.  She has orthopnea as well as PND and her cough is worse when lying flat.  She presented to the ER tonight because of this and was found to have new inferloateral ST changes on EKG.  She denies having any chest pain and is currently not short of breath.  She did have some lower right sided back pain earlier today.  Otherwise she feels ok currently.     Past Medical History  Diagnosis Date  . Mitral regurgitation     Mild-to-moderate, echo, January, 2010 / moderate, e eccentric jet, echo, March, 2012  . Ejection fraction     40%, echo, January, 2010 / EF 20-25%, echo, hospital, June, 2011  / EF 25-30%, echo, March, 2012  . Dyslipidemia   . CAD (coronary artery disease)     Nuclear, January, 2010, no ischemia  . Hx of CABG     2007  . Cardiomyopathy     Ischemic  . ESRD (end stage renal disease)     Dialysis, right arm shunt  / PCI tissue, interventional radiology September, 2010 / temporary catheter left subclavian, new AV fistula right arm maturing March, 201 t2  . Diabetes mellitus   . Anemia     Chronic  . PAD (peripheral artery disease)     Right axillofemoral bypass, Dr. Arbie Cookey  . Aspirin allergy     Takes Plavix  . Hypertension   . Carotid bruit     May, 2011  . CHF (congestive heart failure)   . Hyperlipidemia   . Myocardial infarction   . Peripheral arterial disease   . Arthritis   . Carotid artery occlusion   . Heart murmur   . Anxiety     Past Surgical History  Procedure Date  . Coronary artery bypass graft  07/27/2006    [name]. [name], M.D  . Abdominal hysterectomy   . Laparoscopic salpingoopherectomy   . Appendectomy     . Fem-fem bypass 2003    left to right   . Right femoral bypass 2004  . Basal cell carcinoma excision 2004    REMOVAL FROM NOSE  . Arteriovenous graft placement right  11-30-2010,  05-20-2011    Family History  Problem Relation Age of Onset  . Diabetes Mother   . Kidney disease Father     Social History:  reports that she quit smoking about 5 years ago. Her smoking use included Cigarettes. She has a 100 pack-year smoking history. She has never used smokeless tobacco. She reports that she does not drink alcohol or use illicit drugs.  Allergies:  Allergies  Allergen Reactions  . Aspirin   . Nsaids   . Penicillins     Current Facility-Administered Medications  Medication Dose Route Frequency Provider Last Rate Last Dose  . 0.9 %  sodium chloride infusion  250 mL Intravenous Continuous Meridee Score      . acetaminophen (TYLENOL) tablet 650 mg  650 mg Oral Q4H PRN Meridee Score      . ALPRAZolam Prudy Feeler) tablet 0.5 mg  0.5 mg Oral TID PRN Meridee Score      . carvedilol (COREG) tablet 12.5 mg  12.5  mg Oral BID Meridee Score      . cinacalcet (SENSIPAR) tablet 30 mg  30 mg Oral Daily Meridee Score      . cloNIDine (CATAPRES) tablet 0.1 mg  0.1 mg Oral QHS Meridee Score      . clopidogrel (PLAVIX) tablet 75 mg  75 mg Oral Daily Meridee Score      . furosemide (LASIX) tablet 80 mg  80 mg Oral Daily Meridee Score      . HYDROcodone-acetaminophen (NORCO) 5-325 MG per tablet 1 tablet  1 tablet Oral Q6H PRN Meridee Score      . insulin aspart (novoLOG) injection 0-9 Units  0-9 Units Subcutaneous TID WC Meridee Score      . insulin NPH (HUMULIN N,NOVOLIN N) injection 30 Units  30 Units Subcutaneous BID Meridee Score      . isosorbide mononitrate (IMDUR) 24 hr tablet 30 mg  30 mg Oral Daily Meridee Score      . lisinopril (PRINIVIL,ZESTRIL) tablet 20 mg  20 mg Oral Daily Meridee Score      . nitroGLYCERIN (NITROSTAT) SL tablet 0.4 mg  0.4 mg Sublingual Q5 min PRN Meridee Score      . ondansetron (ZOFRAN) injection 4 mg  4 mg Intravenous Q6H PRN Meridee Score      . sevelamer (RENVELA) tablet 1,600 mg  1,600 mg Oral TID WC Meridee Score      . simvastatin (ZOCOR) tablet 20 mg  20 mg Oral q1800 Meridee Score      . sodium chloride 0.9 % injection 3 mL  3 mL Intravenous Q12H Meridee Score      . sodium chloride 0.9 % injection 3 mL  3 mL Intravenous PRN Meridee Score      . DISCONTD: clopidogrel (PLAVIX) tablet 75 mg  75 mg Oral Q breakfast Meridee Score        ROS: A full review of systems is obtained and is negative except as noted in the HPI.  Physical Exam: Blood pressure 148/57, pulse 90, temperature 98.9 F (37.2 C), temperature source Oral, resp. rate 20, height 5\' 3"  (1.6 m), weight 52.1 kg (114 lb 13.8 oz), last menstrual period 08/06/1986, SpO2 98.00%.  GENERAL: no acute distress.  EYES: Extra ocular movements are intact. There is no lid lag. Sclera is anicteric.  ENT: Oropharynx is clear. Dentition is within normal limits.  NECK: Supple. The thyroid is not enlarged.  LYMPH: There are no masses or lymphadenopathy present.  HEART: Regular rate and rhythm with 2/6 SEM and 2/6 holosystolic murmur at apex, JVP elevated LUNGS: bilateral crackles 1/3 up back ABDOMEN: Soft, non-tender, and non-distended with normoactive bowel sounds. There is no hepatosplenomegaly.  EXTREMITIES: No clubbing, cyanosis, or edema.  PULSES:Femoral pulse 1+ on left, not palp on right, graft is palpable.  SKIN: Warm, dry, and intact.  NEUROLOGIC: The patient was oriented to person, place, and time. No overt neurologic deficits were detected.  PSYCH: Normal judgment and insight, mood is appropriate.   Results: Lab Results  Component Value Date   WBC 6.1 05/21/2011   HGB 7.7* 05/21/2011   HCT 24.6* 05/21/2011   MCV 89.8 05/21/2011   PLT 234 05/21/2011    Lab Results  Component Value Date   CREATININE 6.80* 05/21/2011   BUN 52* 05/21/2011   NA 135 05/21/2011   K 5.8*  05/21/2011   CL 100 05/21/2011   CO2 24 05/21/2011    Lab Results  Component Value Date   ALT <5  05/20/2011   AST 7 05/20/2011   ALKPHOS 53 05/20/2011   BILITOT 0.3 05/20/2011   CXR: report from [place] is normal She also had VQ scan that was unremarkable  EKG: NSR with inferolateral TWA  Assessment/Plan: 70 yo WF with ESRD, ischemic cardiomyopathy and PVD s/p recent right axillofemoral bypass is transferred with 3wks of cough/SOB and concerns for ACS.  I suspect her EKG changes may be related to her cardiomyopathy rather than ACS.  She is currently pain free  1. SOB/Cough: I suspect this is due to volume overload.  Her current dry weight is 51kg and might need to be reduced.  She receives HD on TTHS - consult renal in AM to consider removing more fluid - no current evidence of infection - if cough not improving with fluid removal, Dr. Dimas Aguas has asked that we have pulmonary look at her.   2. Cardiomyopathy: currently appears volume expanded but well perfused - continue coreg and acei - fluid removal as above  3. CAD: EKG changes could be related to ischemia but I suspect due to cardiomyopathy - follow serial troponins - allergy to ASA, continue plavix - note that she received wt based lovenox 50mg  tonight at [facility], will not give any further anticoagulation for now given ESRD - will consider further ischemic evaluation depending on how she does  4. PVD: recent axillofem bypass  5. ESRD: - consult renal as above in the AM for HD   Ripley Bogosian 08/07/2011, 1:31 AM

## 2011-08-08 ENCOUNTER — Encounter (HOSPITAL_COMMUNITY): Payer: Self-pay | Admitting: Nephrology

## 2011-08-08 ENCOUNTER — Inpatient Hospital Stay (HOSPITAL_COMMUNITY): Payer: Medicare Other

## 2011-08-08 DIAGNOSIS — R05 Cough: Secondary | ICD-10-CM | POA: Diagnosis present

## 2011-08-08 DIAGNOSIS — M549 Dorsalgia, unspecified: Secondary | ICD-10-CM | POA: Diagnosis present

## 2011-08-08 DIAGNOSIS — I5023 Acute on chronic systolic (congestive) heart failure: Secondary | ICD-10-CM

## 2011-08-08 LAB — GLUCOSE, CAPILLARY
Glucose-Capillary: 57 mg/dL — ABNORMAL LOW (ref 70–99)
Glucose-Capillary: 54 mg/dL — ABNORMAL LOW (ref 70–99)
Glucose-Capillary: 70 mg/dL (ref 70–99)
Glucose-Capillary: 131 mg/dL — ABNORMAL HIGH (ref 70–99)

## 2011-08-08 MED ORDER — SODIUM CHLORIDE 0.9 % IV SOLN: 100.0000 mL | INTRAVENOUS | Status: DC | PRN

## 2011-08-08 MED ORDER — ALTEPLASE 2 MG IJ SOLR
2.0000 mg | Freq: Once | INTRAMUSCULAR | Status: AC | PRN
  Filled 2011-08-08: qty 2

## 2011-08-08 MED ORDER — LIDOCAINE HCL (PF) 1 % IJ SOLN: 5.0000 mL | INTRAMUSCULAR | Status: DC | PRN

## 2011-08-08 MED ORDER — PARICALCITOL 5 MCG/ML IV SOLN
INTRAVENOUS | Status: AC
  Administered 2011-08-08: 2 ug via INTRAVENOUS
  Filled 2011-08-08: qty 1

## 2011-08-08 MED ORDER — ISOSORBIDE MONONITRATE ER 30 MG PO TB24
30.0000 mg | ORAL_TABLET | Freq: Every day | ORAL | Status: DC
  Filled 2011-08-08: qty 1

## 2011-08-08 MED ORDER — HEPARIN SODIUM (PORCINE) 1000 UNIT/ML DIALYSIS
100.0000 [IU]/kg | INTRAMUSCULAR | Status: DC | PRN
  Administered 2011-08-08: 5200 [IU] via INTRAVENOUS_CENTRAL

## 2011-08-08 MED ORDER — INSULIN NPH (HUMAN) (ISOPHANE) 100 UNIT/ML ~~LOC~~ SUSP
20.0000 [IU] | Freq: Two times a day (BID) | SUBCUTANEOUS | Status: DC
  Administered 2011-08-09: 20 [IU] via SUBCUTANEOUS

## 2011-08-08 MED ORDER — PARICALCITOL 5 MCG/ML IV SOLN
2.0000 ug | INTRAVENOUS | Status: DC
  Filled 2011-08-08: qty 0.4

## 2011-08-08 MED ORDER — PENTAFLUOROPROP-TETRAFLUOROETH EX AERO
1.0000 "application " | INHALATION_SPRAY | CUTANEOUS | Status: DC | PRN
  Filled 2011-08-08: qty 103.5

## 2011-08-08 MED ORDER — LIDOCAINE-PRILOCAINE 2.5-2.5 % EX CREA
1.0000 "application " | TOPICAL_CREAM | CUTANEOUS | Status: DC | PRN
  Filled 2011-08-08: qty 5

## 2011-08-08 MED ORDER — CARVEDILOL 3.125 MG PO TABS
3.1250 mg | ORAL_TABLET | Freq: Two times a day (BID) | ORAL | Status: DC
  Administered 2011-08-09: 3.125 mg via ORAL
  Filled 2011-08-08 (×3): qty 1

## 2011-08-08 MED ORDER — ISOSORBIDE MONONITRATE ER 30 MG PO TB24
30.0000 mg | ORAL_TABLET | Freq: Every day | ORAL | Status: DC
Start: 2011-08-09 — End: 2011-08-09
  Filled 2011-08-08: qty 1

## 2011-08-08 MED ORDER — LISINOPRIL 20 MG PO TABS: 20.0000 mg | ORAL_TABLET | Freq: Every day | ORAL | Status: DC

## 2011-08-08 MED ORDER — DARBEPOETIN ALFA-POLYSORBATE 100 MCG/0.5ML IJ SOLN
INTRAMUSCULAR | Status: AC
  Administered 2011-08-08: 100 ug via INTRAVENOUS
  Filled 2011-08-08: qty 0.5

## 2011-08-08 MED ORDER — HEPARIN SODIUM (PORCINE) 1000 UNIT/ML DIALYSIS: 1000.0000 [IU] | INTRAMUSCULAR | Status: DC | PRN

## 2011-08-08 MED ORDER — NEPRO/CARBSTEADY PO LIQD
237.0000 mL | ORAL | Status: DC | PRN
  Filled 2011-08-08: qty 237

## 2011-08-08 MED ORDER — PARICALCITOL 5 MCG/ML IV SOLN
2.0000 ug | INTRAVENOUS | Status: DC
  Administered 2011-08-08: 2 ug via INTRAVENOUS
  Filled 2011-08-08: qty 0.4

## 2011-08-08 MED ORDER — DARBEPOETIN ALFA-POLYSORBATE 100 MCG/0.5ML IJ SOLN
100.0000 ug | INTRAMUSCULAR | Status: DC
  Administered 2011-08-08: 100 ug via INTRAVENOUS
  Filled 2011-08-08: qty 0.5

## 2011-08-08 MED ORDER — ISOSORBIDE MONONITRATE 15 MG HALF TABLET
15.0000 mg | ORAL_TABLET | Freq: Every day | ORAL | Status: DC
  Filled 2011-08-08: qty 1

## 2011-08-08 MED ORDER — RENA-VITE PO TABS
1.0000 | ORAL_TABLET | Freq: Every day | ORAL | Status: DC
  Administered 2011-08-08 – 2011-08-09 (×2): 1 via ORAL
  Filled 2011-08-08 (×2): qty 1

## 2011-08-08 MED ORDER — CINACALCET HCL 30 MG PO TABS
30.0000 mg | ORAL_TABLET | Freq: Every day | ORAL | Status: DC
  Administered 2011-08-08: 30 mg via ORAL
  Filled 2011-08-08 (×2): qty 1

## 2011-08-08 NOTE — Progress Notes (Signed)
SUBJECTIVE:   Patient is feeling better overall. However she did have low glucose this morning. Her insulin dose will have to be decreased in the hospital. Also her blood pressures running on the low side. We will stop her clonidine.   Filed Vitals:   08/08/11 0500 08/08/11 0600 08/08/11 0700 08/08/11 0800  BP: 104/33 100/34 92/37 94/42   Pulse: 63 63 62 63  Temp:      TempSrc:      Resp: 16 17 15 19   Height:      Weight:      SpO2: 99% 97% 100% 99%    Intake/Output Summary (Last 24 hours) at 08/08/11 0834 Last data filed at 08/08/11 0800  Gross per 24 hour  Intake    900 ml  Output    750 ml  Net    150 ml    LABS: Basic Metabolic Panel:  Basename 08/07/11 0146  NA 139  K 4.3  CL 97  CO2 28  GLUCOSE 194*  BUN 29*  CREATININE 3.84*  CALCIUM 9.9  MG --  PHOS --   Liver Function Tests:  Basename 08/07/11 0146  AST 9  ALT 5  ALKPHOS 54  BILITOT 0.2*  PROT 6.3  ALBUMIN 3.0*   No results found for this basename: LIPASE:2,AMYLASE:2 in the last 72 hours CBC:  Basename 08/07/11 0146  WBC 4.6  NEUTROABS 2.7  HGB 10.2*  HCT 32.7*  MCV 90.1  PLT 249   Cardiac Enzymes:  Basename 08/07/11 1537 08/07/11 0930 08/07/11 0146  CKTOTAL 27 25 30   CKMB 2.5 2.6 2.6  CKMBINDEX -- -- --  TROPONINI <0.30 <0.30 <0.30   BNP: No results found for this basename: POCBNP:3 in the last 72 hours D-Dimer: No results found for this basename: DDIMER:2 in the last 72 hours Hemoglobin A1C:  Basename 08/07/11 0146  HGBA1C 6.7*   Fasting Lipid Panel: No results found for this basename: CHOL,HDL,LDLCALC,TRIG,CHOLHDL,LDLDIRECT in the last 72 hours Thyroid Function Tests:  Basename 08/07/11 0146  TSH 0.687  T4TOTAL --  T3FREE --  THYROIDAB --    RADIOLOGY: Dg Chest Port 1 View  08/07/2011  *RADIOLOGY REPORT*  Clinical Data: Shortness of breath  PORTABLE CHEST - 1 VIEW  Comparison: Chest x-ray of 05/20/2011  Findings: There is only mild left basilar atelectasis present.   No focal infiltrate or effusion is seen.  Cardiomegaly is stable. Median sternotomy sutures are noted from prior CABG.  No bony abnormality is seen.  IMPRESSION: Mild left basilar atelectasis.  Stable cardiomegaly.  Original Report Authenticated By: Juline Patch, M.D.    PHYSICAL EXAM   patient is frail but stable. She is oriented to person time and place. Affect is normal. Lungs reveal scattered rhonchi. Cardiac exam reveals S1 and S2. There is a soft systolic murmur. The abdomen is soft. There is no peripheral edema.   TELEMETRY: Telemetry is reviewed by me. There is normal sinus rhythm.   ASSESSMENT AND PLAN:  Active Problems:  MITRAL REGURGITATION  HYPERTENSION, UNSPECIFIED  CARDIOMYOPATHY, ISCHEMIC   ESRD     Today is her dialysis day. I am in the process of calling the nephrology team to asked that she be dialyzed here today.  CORONARY ARTERY BYPASS GRAFT, HX OF   CAD (coronary artery disease)      Coronary disease is stable. Troponins reveal no evidence of myocardial injury.   ESRD (end stage renal disease)   Back pain      The patient presented with  some back pain. This is greatly improved. It appears that this was musculoskeletal. No further workup.  Cough      The patient initially had a cough. This also has improved.  Overall, the patient has stabilized. It appears that she may have had some musculoskeletal pain with the cough. There is no definite evidence of an acute pulmonary decompensation or cardiac decompensation. We will plan for her to be dialyzed today and watch. If she remains clinically stable I am hopeful she may be able to go home tomorrow. We will make the appropriate decisions about what bed she remains in the hospital after her dialysis today.   [name] 08/08/2011 8:34 AM

## 2011-08-08 NOTE — Consult Note (Signed)
Reason for Consult:ESRD, Anemia, HPTH Referring Physician: Valleri Hendricksen is an 70 y.o. female.  HPI: 70 year old female with hx CAD, ICM, Anemia, PAD, Severe COPD O2 Dependent, hyperlipidemia, DM, DJD, & ESRD from DM admitted with back pain and cough, with mild EKG changes.  Neg W/U and doing well. Needs HD.  Denies SOB, CP, edema, or fevers. No problems on HD.  [place] records not available.  Dialyzes at [facility] on TTS since 3 yr. Primary Nephrologist [place]. EDW unknown. HD [place] unknown, Dialyzer unknown, Heparin STRD. AccessRUA AVF  Past Medical History  Diagnosis Date  . Mitral regurgitation     Mild-to-moderate, echo, January, 2010 / moderate, e eccentric jet, echo, March, 2012  . Ejection fraction     40%, echo, January, 2010 / EF 20-25%, echo, hospital, June, 2011  / EF 25-30%, echo, March, 2012  . Dyslipidemia   . CAD (coronary artery disease)     Nuclear, January, 2010, no ischemia  . Hx of CABG     2007  . Cardiomyopathy     Ischemic  . ESRD (end stage renal disease)     Dialysis, right arm shunt  / PCI tissue, interventional radiology September, 2010 / temporary catheter left subclavian, new AV fistula right arm maturing March, 201 t2  . Diabetes mellitus   . Anemia     Chronic  . PAD (peripheral artery disease)     Right axillofemoral bypass, Dr. Arbie Cookey  . Aspirin allergy     Takes Plavix  . Hypertension   . Carotid bruit     May, 2011  . CHF (congestive heart failure)   . Hyperlipidemia   . Myocardial infarction   . Peripheral arterial disease   . Arthritis   . Carotid artery occlusion   . Heart murmur   . Anxiety     Past Surgical History  Procedure Date  . Coronary artery bypass graft  07/27/2006    [name]. [name], M.D  . Abdominal hysterectomy   . Laparoscopic salpingoopherectomy   . Appendectomy   . Fem-fem bypass 2003    left to right   . Right femoral bypass 2004  . Basal cell carcinoma excision 2004    REMOVAL FROM NOSE  .  Arteriovenous graft placement right  11-30-2010,  05-20-2011    Family History  Problem Relation Age of Onset  . Diabetes Mother   . Kidney disease Father     Social History:  reports that she quit smoking about 5 years ago. Her smoking use included Cigarettes. She has a 100 pack-year smoking history. She has never used smokeless tobacco. She reports that she does not drink alcohol or use illicit drugs.  Allergies:  Allergies  Allergen Reactions  . Aspirin Other (See Comments)    unknown  . Nsaids Other (See Comments)    unknown  . Penicillins Other (See Comments)    unknown    Medications: I have reviewed the patient's current medications. Changes made Zemplar [place] , Hectorol [place] , Calcitriol [place]. Epogen [place], Procrit [place], Aranesp [place]. Infed [place], Venofer [place]  Results for orders placed during the hospital encounter of 08/07/11 (from the past 48 hour(s))  MRSA PCR SCREENING     Status: Normal   Collection Time   08/07/11 12:22 AM      Component Value Range Comment   MRSA by PCR NEGATIVE  NEGATIVE    GLUCOSE, CAPILLARY     Status: Abnormal   Collection Time  08/07/11 12:24 AM      Component Value Range Comment   Glucose-Capillary 211 (*) 70 - 99 (mg/dL)    Comment 1 Documented in Chart      Comment 2 Notify RN     CARDIAC PANEL(CRET KIN+CKTOT+MB+TROPI)     Status: Normal   Collection Time   08/07/11  1:46 AM      Component Value Range Comment   Total CK 30  7 - 177 (U/L)    CK, MB 2.6  0.3 - 4.0 (ng/mL)    Troponin I <0.30  <0.30 (ng/mL)    Relative Index RELATIVE INDEX IS INVALID  0.0 - 2.5    PROTIME-INR     Status: Normal   Collection Time   08/07/11  1:46 AM      Component Value Range Comment   Prothrombin Time 14.5  11.6 - 15.2 (seconds)    INR 1.11  0.00 - 1.49    APTT     Status: Normal   Collection Time   08/07/11  1:46 AM      Component Value Range Comment   aPTT 35  24 - 37 (seconds)   CBC     Status: Abnormal   Collection Time   08/07/11  1:46 AM       Component Value Range Comment   WBC 4.6  4.0 - 10.5 (K/uL)    RBC 3.63 (*) 3.87 - 5.11 (MIL/uL)    Hemoglobin 10.2 (*) 12.0 - 15.0 (g/dL)    HCT 82.9 (*) 56.2 - 46.0 (%)    MCV 90.1  78.0 - 100.0 (fL)    MCH 28.1  26.0 - 34.0 (pg)    MCHC 31.2  30.0 - 36.0 (g/dL)    RDW 13.0 (*) 86.5 - 15.5 (%)    Platelets 249  150 - 400 (K/uL)   DIFFERENTIAL     Status: Normal   Collection Time   08/07/11  1:46 AM      Component Value Range Comment   Neutrophils Relative 60  43 - 77 (%)    Neutro Abs 2.7  1.7 - 7.7 (K/uL)    Lymphocytes Relative 29  12 - 46 (%)    Lymphs Abs 1.3  0.7 - 4.0 (K/uL)    Monocytes Relative 9  3 - 12 (%)    Monocytes Absolute 0.4  0.1 - 1.0 (K/uL)    Eosinophils Relative 2  0 - 5 (%)    Eosinophils Absolute 0.1  0.0 - 0.7 (K/uL)    Basophils Relative 1  0 - 1 (%)    Basophils Absolute 0.0  0.0 - 0.1 (K/uL)   TSH     Status: Normal   Collection Time   08/07/11  1:46 AM      Component Value Range Comment   TSH 0.687  0.350 - 4.500 (uIU/mL)   COMPREHENSIVE METABOLIC PANEL     Status: Abnormal   Collection Time   08/07/11  1:46 AM      Component Value Range Comment   Sodium 139  135 - 145 (mEq/L)    Potassium 4.3  3.5 - 5.1 (mEq/L)    Chloride 97  96 - 112 (mEq/L)    CO2 28  19 - 32 (mEq/L)    Glucose, Bld 194 (*) 70 - 99 (mg/dL)    BUN 29 (*) 6 - 23 (mg/dL)    Creatinine, Ser 7.84 (*) 0.50 - 1.10 (mg/dL)    Calcium 9.9  8.4 -  10.5 (mg/dL)    Total Protein 6.3  6.0 - 8.3 (g/dL)    Albumin 3.0 (*) 3.5 - 5.2 (g/dL)    AST 9  0 - 37 (U/L)    ALT 5  0 - 35 (U/L)    Alkaline Phosphatase 54  39 - 117 (U/L)    Total Bilirubin 0.2 (*) 0.3 - 1.2 (mg/dL)    GFR calc non Af Amer 11 (*) >90 (mL/min)    GFR calc Af Amer 13 (*) >90 (mL/min)   HEMOGLOBIN A1C     Status: Abnormal   Collection Time   08/07/11  1:46 AM      Component Value Range Comment   Hemoglobin A1C 6.7 (*) <5.7 (%)    Mean Plasma Glucose 146 (*) <117 (mg/dL)   GLUCOSE, CAPILLARY     Status: Normal     Collection Time   08/07/11  7:58 AM      Component Value Range Comment   Glucose-Capillary 95  70 - 99 (mg/dL)   CARDIAC PANEL(CRET KIN+CKTOT+MB+TROPI)     Status: Normal   Collection Time   08/07/11  9:30 AM      Component Value Range Comment   Total CK 25  7 - 177 (U/L)    CK, MB 2.6  0.3 - 4.0 (ng/mL)    Troponin I <0.30  <0.30 (ng/mL)    Relative Index RELATIVE INDEX IS INVALID  0.0 - 2.5    GLUCOSE, CAPILLARY     Status: Normal   Collection Time   08/07/11 11:29 AM      Component Value Range Comment   Glucose-Capillary 86  70 - 99 (mg/dL)   CARDIAC PANEL(CRET KIN+CKTOT+MB+TROPI)     Status: Normal   Collection Time   08/07/11  3:37 PM      Component Value Range Comment   Total CK 27  7 - 177 (U/L)    CK, MB 2.5  0.3 - 4.0 (ng/mL)    Troponin I <0.30  <0.30 (ng/mL)    Relative Index RELATIVE INDEX IS INVALID  0.0 - 2.5    GLUCOSE, CAPILLARY     Status: Abnormal   Collection Time   08/07/11  6:14 PM      Component Value Range Comment   Glucose-Capillary 175 (*) 70 - 99 (mg/dL)   GLUCOSE, CAPILLARY     Status: Normal   Collection Time   08/07/11 10:24 PM      Component Value Range Comment   Glucose-Capillary 81  70 - 99 (mg/dL)    Comment 1 Documented in Chart      Comment 2 Notify RN     GLUCOSE, CAPILLARY     Status: Abnormal   Collection Time   08/08/11  3:25 AM      Component Value Range Comment   Glucose-Capillary 57 (*) 70 - 99 (mg/dL)   GLUCOSE, CAPILLARY     Status: Abnormal   Collection Time   08/08/11  3:31 AM      Component Value Range Comment   Glucose-Capillary 54 (*) 70 - 99 (mg/dL)    Comment 1 Documented in Chart      Comment 2 Notify RN     GLUCOSE, CAPILLARY     Status: Normal   Collection Time   08/08/11  4:26 AM      Component Value Range Comment   Glucose-Capillary 93  70 - 99 (mg/dL)    Comment 1 Documented in Chart  Comment 2 Notify RN     GLUCOSE, CAPILLARY     Status: Normal   Collection Time   08/08/11  8:11 AM       Component Value Range Comment   Glucose-Capillary 70  70 - 99 (mg/dL)     Dg Chest Port 1 View  08/07/2011  *RADIOLOGY REPORT*  Clinical Data: Shortness of breath  PORTABLE CHEST - 1 VIEW  Comparison: Chest x-ray of 05/20/2011  Findings: There is only mild left basilar atelectasis present.  No focal infiltrate or effusion is seen.  Cardiomegaly is stable. Median sternotomy sutures are noted from prior CABG.  No bony abnormality is seen.  IMPRESSION: Mild left basilar atelectasis.  Stable cardiomegaly.  Original Report Authenticated By: Juline Patch, M.D.    @ROS @ Blood pressure 107/33, pulse 64, temperature 97.9 F (36.6 C), temperature source Oral, resp. rate 15, height 5\' 3"  (1.6 m), weight 52 kg (114 lb 10.2 oz), last menstrual period 08/06/1986, SpO2 100.00%. @PHYSEXAMBYAGE2 @ Physical Examination: General appearance - alert, well appearing, and in no distress, chronically ill appearing and pale,cachectic Mental status - alert, oriented to person, place, and time Eyes - pupils equal and reactive, extraocular eye movements intact, funduscopic exam abnormal  H & E, DM retinopathy Mouth - mucous membranes moist, pharynx normal without lesions and Lower partial plate Neck - adenopathy noted PCL, carotid bruit noted Chest - clear to auscultation, no wheezes, rales or rhonchi, symmetric air entry, rales noted bibasilar, decreased Bs, poor expansion Heart - normal rate, regular rhythm, normal S1, S2, no murmurs, rubs, clicks or gallops, S1 and S2 normal, S4 present, systolic murmur 2/6 at apex Abdomen - soft, nontender, nondistended, no masses or organomegaly hepatomegaly down 4 cm Neurological - alert, oriented, normal speech, no focal findings or movement disorder noted, motor and sensory grossly normal bilaterally Musculoskeletal - no joint tenderness, deformity or swelling Extremities - peripheral pulses normal, no pedal edema, no clubbing or cyanosis, peripheral pulses abnormal AAVF RUA,  abnormal exam of trophic changes LE, Blat fem bruits, no DP pulses, no edema Skin - see above, bruise  Assessment/Plan: 1 CAD stable 2 ESRD: for HD get records 3 Hypertension: low bp, decrease meds 4. Anemia of ESRD: start epo 5. Metabolic Bone Disease: get records 6 PAD severe 7 COPD O2 dependent, severe 9 DM controlled.  P get records, HD meds, modify meds.  Loran Auguste L 08/08/2011, 11:45 AM

## 2011-08-08 NOTE — Progress Notes (Signed)
Chaplain Note:  At request of pt nurse, chaplain visited with pt and provided spiritual comfort and support.  Pt was resting in bed preparing for a dialysis treatment.  Discussed personal issues in the realm of pastoral confidence.  Prayed with pt.  Pt was appreciative of chaplain support.  Chaplain will continue to follow.  Verdie Shire chaplain resident [phone])

## 2011-08-08 NOTE — Progress Notes (Signed)
  I was present at this session.  I have reviewed the session itself and made appropriate changes.  Paw Karstens L 11/15/20121:35 PM

## 2011-08-09 DIAGNOSIS — I059 Rheumatic mitral valve disease, unspecified: Secondary | ICD-10-CM

## 2011-08-09 LAB — GLUCOSE, CAPILLARY
Glucose-Capillary: 300 mg/dL — ABNORMAL HIGH (ref 70–99)
Glucose-Capillary: 114 mg/dL — ABNORMAL HIGH (ref 70–99)
Glucose-Capillary: 180 mg/dL — ABNORMAL HIGH (ref 70–99)

## 2011-08-09 MED ORDER — CARVEDILOL 3.125 MG PO TABS: 3.1250 mg | ORAL_TABLET | Freq: Two times a day (BID) | ORAL | Status: DC

## 2011-08-09 NOTE — Discharge Summary (Signed)
Discharge Summary   Patient ID: [name],  MRN: [id number], DOB/AGE: 10/06/1940 70 y.o.  Admit date: 08/07/2011 Discharge date: 08/09/2011  Discharge Diagnoses Active Problems:  MITRAL REGURGITATION  CARDIOMYOPATHY, ISCHEMIC  CORONARY ARTERY BYPASS GRAFT, HX OF  CAD (coronary artery disease)  ESRD (end stage renal disease)  Anemia  Back pain  Cough   Allergies Allergies  Allergen Reactions  . Aspirin Other (See Comments)    unknown  . Nsaids Other (See Comments)    unknown  . Penicillins Other (See Comments)    unknown    Procedures  None  History of Present Illness  [name] is a 70 year old female with past medical history significant for CAD (3-vessel CABG in November 2007, nuclear stress test in January 2010 revealed no ischemia), ischemic cardiomyopathy (40%, echo, January, 2010 / EF 20-25%, echo, hospital, June, 2011 / EF 25-30%, echo, March, 2012), ESRD  presenting with worsening SOB and EKG changes concerning for ACS.   Hospital Course   She presented to the ED with complaints of cough and shortness of breath x3 weeks without fever or other constitutional symptoms. She endorse associated orthopnea, PND and cough which worsened when lying flat. She was found to have new inferolateral ST changes on EKG, but denied chest pain and shortness of breath while in the ED. CXR revealed no acute cardiopulmonary process. She was subsequently admitted to the hospital for ACS rule out. significantly In the hospital, cardiac enzymes were negative x3 and her symptoms improved. EKG changes present on ED arrival were then attributed to her ischemic cardiomyopathy. Shortness of breath and cough with suspected to be due to volume overload secondary to her ischemic cardiomyopathy and systolic dysfunction with recent echocardiogram in 2011 revealing EF of 25-30%. Infection was ruled out. She continued outpatient Lasix, Coreg and ACE inhibitor and diuresed well with continued  improvement in her symptoms. In addition hemoglobin was found to be 6.7, she was diagnosed with anemia secondary to ESRD, she was given Epo per nephrology and Hgb improved to 10.2.  On day 2 of her admission, she was found to be mildly hypoglycemic and insulin was adjusted appropriately. She resumed diuresis in the hospital on a TTS schedule, but became mildly hypotensive with systolic BP 90-110. She continued to diuresis well however her hypotension persisted and outpatient Lasix and Lisinopril were held per nephrology recommendations.   Today her symptoms have improved further since admission. Due to hypotension during her hospital stay, volume removal was a bit limited. She was net -814, with weight loss from 51kg on admission to 49.9kg currently. She will resume her dialysis schedule in [place] where volume status will be monitored. Outpatient clonidine, Lasix and lisinopril were discontinued. She'll continue on Coreg adjusted to 3.125 mg BID. She was advised to monitor her blood pressure daily and hold Imdur if her systolic blood pressure falls below 100-110. She has been normotensive today and is in good condition for discharge.   Discharge Vitals:  Blood pressure 148/48, pulse 72, temperature 97.9 F (36.6 C), temperature source Oral, resp. rate 20, height 5\' 3"  (1.6 m), weight 49.9 kg (110 lb 0.2 oz), last menstrual period 08/06/1986, SpO2 99.00%.   Labs:   Lab Results  Component Value Date   WBC 4.6 08/07/2011   HGB 10.2* 08/07/2011   HCT 32.7* 08/07/2011   MCV 90.1 08/07/2011   PLT 249 08/07/2011     Lab 08/07/11 0146  NA 139  K 4.3  CL 97  CO2 28  BUN 29*  CREATININE 3.84*  CALCIUM 9.9  PROT 6.3  BILITOT 0.2*  ALKPHOS 54  ALT 5  AST 9  GLUCOSE 194*    Results for CAMILLE, DRAGAN (MRN [id number]) as of 08/09/2011 10:45  Ref. Range 08/07/2011 01:46 08/07/2011 09:30 08/07/2011 15:37  CK, MB Latest Range: 0.3-4.0 ng/mL 2.6 2.6 2.5  CK Total Latest Range: 7-177 U/L 30  25 27   Troponin I Latest Range: <0.30 ng/mL <0.30 <0.30 <0.30   Disposition:  Discharge Orders    Future Appointments: Provider: Department: Dept Phone: Center:   08/22/2011 10:40 AM Gene Serpe, PA [facility] [place] [phone] [facility]     Follow-up Information    Follow up with Gene Serpe, PA on 08/22/2011. (At 10:40 AM. Please bring insurance card and medication list. )    Contact information:   [address] [place], [address] [place] [address] [phone]        Pt is stable, in good condition and will be discharged home today.   Discharge Medications:   Current Discharge Medication List    CONTINUE these medications which have CHANGED   Details  carvedilol (COREG) 3.125 MG tablet Take 1 tablet (3.125 mg total) by mouth 2 (two) times daily. Qty: 60 tablet, Refills: 3      CONTINUE these medications which have NOT CHANGED   Details  ALPRAZolam (XANAX) 0.5 MG tablet Take 0.5 mg by mouth 3 (three) times daily as needed. For anxiety     cinacalcet (SENSIPAR) 30 MG tablet Take 30 mg by mouth daily.      clopidogrel (PLAVIX) 75 MG tablet Take 75 mg by mouth daily.      folic acid-vitamin b complex-vitamin c-selenium-zinc (DIALYVITE) 3 MG TABS Take 1 tablet by mouth daily.      HYDROcodone-acetaminophen (LORTAB) 7.5-500 MG per tablet Take 1 tablet by mouth every 8 (eight) hours as needed. For pain     insulin NPH (HUMULIN N,NOVOLIN N) 100 UNIT/ML injection Inject 40 Units into the skin 2 (two) times daily. As needed    isosorbide mononitrate (IMDUR) 30 MG 24 hr tablet Take 30 mg by mouth daily.      pravastatin (PRAVACHOL) 40 MG tablet Take 1 tablet (40 mg total) by mouth every evening. Qty: 30 tablet, Refills: 6    sevelamer (RENVELA) 800 MG tablet Take 6 tablets with meals and 3 tablets with snacks      STOP taking these medications     cloNIDine (CATAPRES) 0.1 MG tablet Comments:  Reason for Stopping:       furosemide (LASIX) 80 MG tablet Comments:    Reason for Stopping:       lisinopril (PRINIVIL,ZESTRIL) 20 MG tablet Comments:  Reason for Stopping:          Outstanding Labs/Studies: None  Duration of Discharge Encounter: Approximately 60 minutes including physician time.  SignedOdella Aquas , PA-C 08/09/2011, 12:20 PM  Physician note: Please see my progress note from earlier today. The patient is doing quite well. Her EKG changes were chronic. She ruled out for myocardial infarction. She did have some hypotension during dialysis which necessitated decreasing her blood pressure medicines.  She will followup with Dr. Jerral Bonito as an outpatient.  Vesta Mixer, Montez Hageman., MD, [facility] 08/09/2011, 2:00 PM

## 2011-08-09 NOTE — Progress Notes (Signed)
Subjective:    [name] is a 70 year old female with a history of coronary artery disease - status post coronary artery bypass grafting. She has an ischemic cardiomyopathy with an ejection fraction of around 25-30%.  She was admitted in transfer from [facility] for 3 weeks of cough, congestion, and increasing shortness of breath. She was thought to have had an acute coronary syndrome.  In evaluating her previous EKGs. EKG is completely unchanged from her previous tracing on 05/21/2011.  She has ruled out for myocardial infarction with serial CPKs.  The nephrologist service has made some adjustments to her blood pressure medicines because she was hypotensive during dialysis.  She's feeling well and is ready to go home.    . carvedilol  3.125 mg Oral BID  . cinacalcet  30 mg Oral Daily  . clopidogrel  75 mg Oral Daily  . darbepoetin      . darbepoetin (ARANESP) injection - DIALYSIS  100 mcg Intravenous Q Thu-HD  . insulin aspart  0-9 Units Subcutaneous TID WC  . insulin NPH  20 Units Subcutaneous BID AC  . isosorbide mononitrate  30 mg Oral QHS  . multivitamin  1 tablet Oral Daily  . paricalcitol  2 mcg Intravenous Q T,Th,Sa-HD  . sevelamer  1,600 mg Oral TID WC  . simvastatin  20 mg Oral q1800  . DISCONTD: carvedilol  12.5 mg Oral BID  . DISCONTD: cinacalcet  30 mg Oral Daily  . DISCONTD: cloNIDine  0.1 mg Oral QHS  . DISCONTD: furosemide  80 mg Oral Daily  . DISCONTD: insulin NPH  30 Units Subcutaneous BID AC  . DISCONTD: isosorbide mononitrate  15 mg Oral Daily  . DISCONTD: isosorbide mononitrate  30 mg Oral Daily  . DISCONTD: isosorbide mononitrate  30 mg Oral Daily  . DISCONTD: lisinopril  20 mg Oral Daily  . DISCONTD: lisinopril  20 mg Oral QHS  . DISCONTD: paricalcitol  2 mcg Intravenous 3 times weekly      Objective:  Vital Signs in the last 24 hours: Blood pressure 118/36, pulse 73, temperature 98.2 F (36.8 C), temperature source Oral, resp. rate 21, height 5'  3" (1.6 m), weight 110 lb 0.2 oz (49.9 kg), last menstrual period 08/06/1986, SpO2 100.00%. Temp:  [97.5 F (36.4 C)-98.5 F (36.9 C)] 98.2 F (36.8 C) (11/16 0300) Pulse Rate:  [50-96] 73  (11/16 0645) Resp:  [13-25] 21  (11/16 0645) BP: (83-127)/(21-80) 118/36 mmHg (11/16 0600) SpO2:  [79 %-100 %] 100 % (11/16 0645) Weight:  [110 lb 0.2 oz (49.9 kg)-110 lb 14.3 oz (50.3 kg)] 110 lb 0.2 oz (49.9 kg) (11/15 1730)  Intake/Output from previous day: 11/15 0701 - 11/16 0700 In: 720 [P.O.:720] Out: 1924 [Urine:950; Stool:1] Intake/Output from this shift:    Physical Exam: The patient is alert and oriented x 3.  The mood and affect are normal.   Skin: warm and dry.  Color is normal.    HEENT:   the sclera are nonicteric.  The mucous membranes are moist.  The carotids are 2+ without bruits.  There is no thyromegaly.  There is no JVD.    Lungs: clear.  The chest wall is non tender.    Heart: regular rate with a normal S1 and S2.  There are no murmurs, gallops, or rubs. The PMI is not displaced.     Abdomen: good bowel sounds.  There is no guarding or rebound.  There is no hepatosplenomegaly or tenderness.  There are no masses.  Extremities:  no clubbing, cyanosis, or edema.  The legs are without rashes.  The distal pulses are intact.   Neuro:  Cranial nerves II - XII are intact.  Motor and sensory functions are intact.     Lab Results:  [facility] 08/07/11 0146  WBC 4.6  HGB 10.2*  PLT 249    Basename 08/07/11 0146  NA 139  K 4.3  CL 97  CO2 28  GLUCOSE 194*  BUN 29*  CREATININE 3.84*    Basename 08/07/11 1537 08/07/11 0930  TROPONINI <0.30 <0.30   No results found for this basename: BNP in the last 72 hours Hepatic Function Panel  Basename 08/07/11 0146  PROT 6.3  ALBUMIN 3.0*  AST 9  ALT 5  ALKPHOS 54  BILITOT 0.2*  BILIDIR --  IBILI --   Lab Results  Component Value Date   CHOL  Value: 144        ATP III CLASSIFICATION:  <200     mg/dL   Desirable   914-782  mg/dL   Borderline High  >=956    mg/dL   High        10/24/3084   HDL 38* 11/01/2008   LDLCALC  Value: 71        Total Cholesterol/HDL:CHD Risk Coronary Heart Disease Risk Table                     Men   Women  1/2 Average Risk   3.4   3.3  Average Risk       5.0   4.4  2 X Average Risk   9.6   7.1  3 X Average Risk  23.4   11.0        Use the calculated Patient Ratio above and the CHD Risk Table to determine the patient's CHD Risk.        ATP III CLASSIFICATION (LDL):  <100     mg/dL   Optimal  578-469  mg/dL   Near or Above                    Optimal  130-159  mg/dL   Borderline  629-528  mg/dL   High  >413     mg/dL   Very High 10/27/4008   TRIG 175* 11/01/2008   CHOLHDL 3.8 11/01/2008   No results found for this basename: PROTIME in the last 72 hours  I Assessment/Plan:   1. Abnormal EKG: Her EKG is completely unchanged from her previous tracing in August. She has ruled out for myocardial infarction. She has a known ischemic cardiomyopathy with an ejection fraction of 25-30%. There are no changes. She's not had any chest pain and she is stable enough to go home.  2. Ischemic cardiopathy:  The patient has known ischemic heart mouth he. We've had her on standard heart failure medications but unfortunatly she's having hypotension with dialysis. We will need to decrease some of her medications. We will be working with the nephrologist with this.  Vesta Mixer, Montez Hageman., MD, [facility] 08/09/2011, 8:23 AM 08/09/2011, 8:23 AM

## 2011-08-09 NOTE — Progress Notes (Signed)
Subjective:  I feel good.  Just a little pain at l scapula.  Only got just under a liter off in HD due to BPs  Objective Vital signs in last 24 hours: Filed Vitals:   08/09/11 0600 08/09/11 0615 08/09/11 0630 08/09/11 0645  BP: 118/36     Pulse:    73  Temp:      TempSrc:      Resp: 17 15 19 21   Height:      Weight:      SpO2: 100%   100%   Weight change: -1.7 kg (-3 lb 12 oz)  Intake/Output Summary (Last 24 hours) at 08/09/11 0812 Last data filed at 08/09/11 0400  Gross per 24 hour  Intake    480 ml  Output   1573 ml  Net  -1093 ml   Labs: Basic Metabolic Panel:  Lab 08/07/11 9147  NA 139  K 4.3  CL 97  CO2 28  GLUCOSE 194*  BUN 29*  CREATININE 3.84*  CALCIUM 9.9  ALB --  PHOS --   Liver Function Tests:  Lab 08/07/11 0146  AST 9  ALT 5  ALKPHOS 54  BILITOT 0.2*  PROT 6.3  ALBUMIN 3.0*   No results found for this basename: LIPASE:3,AMYLASE:3 in the last 168 hours No results found for this basename: AMMONIA:3 in the last 168 hours CBC:  Lab 08/07/11 0146  WBC 4.6  NEUTROABS 2.7  HGB 10.2*  HCT 32.7*  MCV 90.1  PLT 249   Cardiac Enzymes:  Lab 08/07/11 1537 08/07/11 0930 08/07/11 0146  CKTOTAL 27 25 30   CKMB 2.5 2.6 2.6  CKMBINDEX -- -- --  TROPONINI <0.30 <0.30 <0.30   CBG:  Lab 08/09/11 0044 08/08/11 2018 08/08/11 1848 08/08/11 1151 08/08/11 0811  GLUCAP 300* 209* 105* 131* 70    Iron Studies: No results found for this basename: IRON,TIBC,TRANSFERRIN,FERRITIN in the last 72 hours Studies/Results: No results found. Medications: Infusions:    Scheduled Medications:    . carvedilol  3.125 mg Oral BID  . cinacalcet  30 mg Oral Daily  . clopidogrel  75 mg Oral Daily  . darbepoetin      . darbepoetin (ARANESP) injection - DIALYSIS  100 mcg Intravenous Q Thu-HD  . insulin aspart  0-9 Units Subcutaneous TID WC  . insulin NPH  20 Units Subcutaneous BID AC  . isosorbide mononitrate  30 mg Oral QHS  . multivitamin  1 tablet Oral Daily    . paricalcitol  2 mcg Intravenous Q T,Th,Sa-HD  . sevelamer  1,600 mg Oral TID WC  . simvastatin  20 mg Oral q1800  . DISCONTD: carvedilol  12.5 mg Oral BID  . DISCONTD: cinacalcet  30 mg Oral Daily  . DISCONTD: cloNIDine  0.1 mg Oral QHS  . DISCONTD: furosemide  80 mg Oral Daily  . DISCONTD: insulin NPH  30 Units Subcutaneous BID AC  . DISCONTD: isosorbide mononitrate  15 mg Oral Daily  . DISCONTD: isosorbide mononitrate  30 mg Oral Daily  . DISCONTD: isosorbide mononitrate  30 mg Oral Daily  . DISCONTD: lisinopril  20 mg Oral Daily  . DISCONTD: lisinopril  20 mg Oral QHS  . DISCONTD: paricalcitol  2 mcg Intravenous 3 times weekly    have reviewed scheduled and prn medications.  Physical Exam: General: thin wf, NAD Heart: RRR Lungs: crackles at bilateral bases Abdomen: soft, NT Extremities: no edema Dialysis Access: RUE avf pos thrill and bruit   I Assessment/ Plan: Pt  is a 70 y.o. yo female with ESRD who was admitted on 08/07/2011 with  SOB. Assessment/Plan: 1. SOB- mostly resolved per patient.  She says she wears o2 at home.  Patient is hoping that she will be discharged today.  Unfortunately volume removal limited  Due to low BPs 2. ESRD- tts [facility] [place]  3. Anemia- hgb 10. On aranesp 4. Secondary hyperparathyroidism- on sensipar, zemplar, renavite 5. HTN/volume- better but possibly still a little overloaded.  On very low dose coreg only, Dr. Darrick Penna stopped lisinopril and clonidine appropriately will facilitate further vol removal. 6. If not d/c d today will get HD tomorrow  [name] A   08/09/2011,8:12 AM  LOS: 2 days

## 2011-08-09 NOTE — Progress Notes (Signed)
Discharge teaching done; copy of discharge instructions, medication list and prescription for coreg given to patient.   Pt verbalized understanding of discharge instructions.   Pt's PIV removed from left forearm, no bleeding or bruising noted at site.  Pt taken to entrance in wheelchair by [place], NT.

## 2011-08-22 ENCOUNTER — Encounter: Payer: Medicare Other | Admitting: Physician Assistant

## 2011-08-26 ENCOUNTER — Encounter: Payer: Self-pay | Admitting: Cardiology

## 2011-08-26 ENCOUNTER — Ambulatory Visit (INDEPENDENT_AMBULATORY_CARE_PROVIDER_SITE_OTHER): Payer: Medicare Other | Admitting: Cardiology

## 2011-08-26 VITALS — BP 151/66 | HR 93 | Resp 16 | Ht 63.0 in | Wt 117.0 lb

## 2011-08-26 DIAGNOSIS — I5022 Chronic systolic (congestive) heart failure: Secondary | ICD-10-CM

## 2011-08-26 DIAGNOSIS — I10 Essential (primary) hypertension: Secondary | ICD-10-CM

## 2011-08-26 DIAGNOSIS — I2589 Other forms of chronic ischemic heart disease: Secondary | ICD-10-CM

## 2011-08-26 DIAGNOSIS — I059 Rheumatic mitral valve disease, unspecified: Secondary | ICD-10-CM

## 2011-08-26 DIAGNOSIS — I080 Rheumatic disorders of both mitral and aortic valves: Secondary | ICD-10-CM

## 2011-08-26 DIAGNOSIS — I739 Peripheral vascular disease, unspecified: Secondary | ICD-10-CM

## 2011-08-26 DIAGNOSIS — I340 Nonrheumatic mitral (valve) insufficiency: Secondary | ICD-10-CM

## 2011-08-26 NOTE — Patient Instructions (Signed)
Please continue all your current medications and follow up with Dr. Myrtis Ser in 3 months.

## 2011-08-26 NOTE — Assessment & Plan Note (Signed)
Hypertension controlled.

## 2011-08-26 NOTE — Assessment & Plan Note (Signed)
Continue fluid management. No evidence of volume overload. Patient needs to weigh daily.

## 2011-08-26 NOTE — Progress Notes (Signed)
Peyton Bottoms, MD, [facility] Board Certified in Adult Cardiovascular Medicine,Internal Medicine and Critical Care Medicine    CC: Followup after recent hospitalization for possible heart failure.  HPI:  Ms. [name] is a 70 year old female with past medical history significant for CAD (3-vessel CABG in November 2007, nuclear stress test in January 2010 revealed no ischemia), ischemic cardiomyopathy (40%, echo, January, 2010 / EF 20-25%, echo, hospital, June, 2011 / EF 25-30%, echo, March, 2012), ESRD presenting with worsening SOB and EKG changes concerning for ACS recently at [facility]. She was ruled out for myocardial infarction. The patient however tells me that she was not particularly short of breath that her main complaint was cough which was going on for several weeks. She also complained of back pain and she was found to be severely anemic with a hemoglobin of 6.7. The patient was seen by nephrology and was given Epogen. I suspect she was also transfused. Of note also is that she had a redo axillofemoral bypass graft done in August 2012 by Dr. early. This was secondary to failure of her initial bypass graft severe claudication the right leg. The patient currently reports no chest pain. She also has no shortness of breath at rest. She is in NYHA class IIb/3. She has no orthopnea PND. She monitors her weights carefully and is scheduled for dialysis on Monday Wednesday Friday through a right arm fistula. Her blood pressure is well controlled although she needs to hold her antihypertensive medications prior to dialysis as it bottoms out her blood pressure.      PMH: reviewed and listed in Problem List in Electronic Records (and see below) Past Medical History  Diagnosis Date  . Mitral regurgitation     Mild-to-moderate, echo, January, 2010 / moderate, e eccentric jet, echo, March, 2012, moderate to moderately severe by bedside echocardiogram December 2012  . Ejection fraction    Ejection fraction 25-30%  . Dyslipidemia   . CAD (coronary artery disease)     Nuclear, January, 2010, no ischemia  . Hx of CABG     2007  . Cardiomyopathy     Ischemic status post coronary bypass grafting  . ESRD (end stage renal disease)     Dialysis, right arm shunt  / PCI tissue, interventional radiology September, 2010 / temporary catheter left subclavian, new AV fistula right arm maturing March, 201 t2  . Diabetes mellitus   . Anemia     Chronic  . PAD (peripheral artery disease)     Right axillofemoral bypass, Dr. Arbie Cookey status post redo right axillofemoral bypass August 2012  . Aspirin allergy     Takes Plavix  . Hypertension   . Carotid bruit     May, 2011  . CHF (congestive heart failure)   . Hyperlipidemia   . Myocardial infarction   . Peripheral arterial disease   . Arthritis   . Carotid artery occlusion   . Heart murmur     Secondary to mitral regurgitation  . Anxiety      Allergies/SH/FHX : available in Electronic Records for review  Medications: Current Outpatient Prescriptions  Medication Sig Dispense Refill  . ALPRAZolam (XANAX) 0.5 MG tablet Take 0.5 mg by mouth 3 (three) times daily as needed. For anxiety       . carvedilol (COREG) 3.125 MG tablet Take 1 tablet (3.125 mg total) by mouth 2 (two) times daily.  60 tablet  3  . cinacalcet (SENSIPAR) 30 MG tablet Take 30 mg by mouth daily.        Marland Kitchen  cloNIDine (CATAPRES) 0.1 MG tablet Take 0.1 mg by mouth 2 (two) times daily.        . clopidogrel (PLAVIX) 75 MG tablet Take 75 mg by mouth daily.        . folic acid-vitamin b complex-vitamin c-selenium-zinc (DIALYVITE) 3 MG TABS Take 1 tablet by mouth daily.        . furosemide (LASIX) 80 MG tablet Take 80 mg by mouth 2 (two) times daily.        Marland Kitchen HYDROcodone-acetaminophen (LORTAB) 7.5-500 MG per tablet Take 1 tablet by mouth every 8 (eight) hours as needed. For pain       . insulin NPH (HUMULIN N,NOVOLIN N) 100 UNIT/ML injection Inject 40 Units into the skin 2  (two) times daily. As needed      . isosorbide mononitrate (IMDUR) 30 MG 24 hr tablet Take 30 mg by mouth daily.        Marland Kitchen lisinopril (PRINIVIL,ZESTRIL) 20 MG tablet Take 20 mg by mouth daily.        . pravastatin (PRAVACHOL) 40 MG tablet Take 1 tablet (40 mg total) by mouth every evening.  30 tablet  6  . sevelamer (RENVELA) 800 MG tablet Take 6 tablets with meals and 3 tablets with snacks        ROS: No nausea or vomiting. No fever or chills.No melena or hematochezia.No bleeding.mild claudication in the right leg Physical Exam: BP 151/66  Pulse 93  Resp 16  Ht 5\' 3"  (1.6 m)  Wt 117 lb (53.071 kg)  BMI 20.73 kg/m2  LMP 08/06/1986 General: Somewhat cachectic appearing female but in no distress. Neck: Normal carotid upstroke with bilateral carotid bruits. No thyromegaly nonnodular thyroid. Lungs: Diminished breath sounds bilaterally but no wheezing. Cardiac: Regular rate and rhythm with normal S1 and S2. There is a 3/6 holosystolic murmur heard loudest at the apex and radiating into the axilla Vascular: No edema. I could not palpate a pulse in the dorsalis pedis or posterior tibial artery of the right leg. Very faint pulses in left dorsalis pedis and posterior tibial pulse. Right upper arm AV fistula with normal thrill and bruit. Skin: Warm and dry with decreased skin turgor  12lead ECG: Not obtained today Limited bedside ECHO: Left ventricular dilatation with an ejection fraction of 25-30% with multiple segmental wall motion abnormalities. Moderate to moderately severe mitral regurgitation with an eccentric jet directed towards the posterior wall.   Assessment and Plan

## 2011-08-26 NOTE — Assessment & Plan Note (Signed)
Status post redo axillofemoral bypass by Dr. early stable with minimal claudication right leg

## 2011-08-26 NOTE — Assessment & Plan Note (Signed)
No evidence of angina. Continue medical regimen

## 2011-08-26 NOTE — Assessment & Plan Note (Signed)
Moderate to moderately severe mitral regurgitation by bedside echocardiogram today. This is in the setting of severe LV dysfunction with an ejection fraction of 25-30%. Patient was instructed to monitor her weight carefully and she will need good blood pressure control to avoid further exacerbations of heart failure. 

## 2011-08-26 NOTE — Assessment & Plan Note (Signed)
Moderate to moderately severe mitral regurgitation by bedside echocardiogram today. This is in the setting of severe LV dysfunction with an ejection fraction of 25-30%. Patient was instructed to monitor her weight carefully and she will need good blood pressure control to avoid further exacerbations of heart failure.

## 2011-09-19 ENCOUNTER — Encounter: Payer: Medicare Other | Admitting: Cardiology

## 2011-10-09 DIAGNOSIS — I5023 Acute on chronic systolic (congestive) heart failure: Secondary | ICD-10-CM

## 2011-10-10 DIAGNOSIS — I509 Heart failure, unspecified: Secondary | ICD-10-CM

## 2011-10-14 DIAGNOSIS — I5021 Acute systolic (congestive) heart failure: Secondary | ICD-10-CM

## 2011-10-31 ENCOUNTER — Ambulatory Visit: Payer: Medicare Other | Admitting: Cardiology

## 2011-11-08 ENCOUNTER — Encounter: Payer: Self-pay | Admitting: Cardiology

## 2011-11-08 ENCOUNTER — Ambulatory Visit (INDEPENDENT_AMBULATORY_CARE_PROVIDER_SITE_OTHER): Payer: Medicare Other | Admitting: Cardiology

## 2011-11-08 DIAGNOSIS — R9430 Abnormal result of cardiovascular function study, unspecified: Secondary | ICD-10-CM

## 2011-11-08 DIAGNOSIS — I428 Other cardiomyopathies: Secondary | ICD-10-CM

## 2011-11-08 DIAGNOSIS — I340 Nonrheumatic mitral (valve) insufficiency: Secondary | ICD-10-CM

## 2011-11-08 DIAGNOSIS — I779 Disorder of arteries and arterioles, unspecified: Secondary | ICD-10-CM | POA: Insufficient documentation

## 2011-11-08 DIAGNOSIS — I059 Rheumatic mitral valve disease, unspecified: Secondary | ICD-10-CM

## 2011-11-08 DIAGNOSIS — N186 End stage renal disease: Secondary | ICD-10-CM

## 2011-11-08 DIAGNOSIS — I429 Cardiomyopathy, unspecified: Secondary | ICD-10-CM | POA: Insufficient documentation

## 2011-11-08 DIAGNOSIS — R0989 Other specified symptoms and signs involving the circulatory and respiratory systems: Secondary | ICD-10-CM

## 2011-11-08 DIAGNOSIS — R05 Cough: Secondary | ICD-10-CM

## 2011-11-08 DIAGNOSIS — I5022 Chronic systolic (congestive) heart failure: Secondary | ICD-10-CM

## 2011-11-08 NOTE — Assessment & Plan Note (Signed)
Mitral regurgitation continues to be moderate. No further workup.

## 2011-11-08 NOTE — Patient Instructions (Signed)
Follow up as scheduled. Your physician recommends that you continue on your current medications as directed. Please refer to the Current Medication list given to you today. 

## 2011-11-08 NOTE — Assessment & Plan Note (Signed)
She has a mild persistent cough. This is noncardiac in origin and it is not related to her cardiac meds. No further workup

## 2011-11-08 NOTE — Assessment & Plan Note (Signed)
Her volume is managed with dialysis. It is concerning that she had fairly sudden onset of her symptoms that led to hospitalization. However there was no proof of cardiac ischemia. Followup 2-D echo did reveal her ejection fraction in the 20-25% range. I have carefully reviewed with her there would be an indication for an ICD in her case. She is on chronic dialysis. There is no evidence that this approach would lead to long-term improve survival. Therefore I have not brought to suffer discussion. I will continue to consider over time whether or not there is any place for CRT therapy. I cannot adjust her meds any further as she has problems with hypotension and dialysis. It is important to know that she is much better understanding of her salt intake. I believe this will help significantly.

## 2011-11-08 NOTE — Progress Notes (Signed)
HPI Patient is seen back today after hospitalization for CHF. I have reviewed the extensive hospital records. I have reviewed the echo report. I have also spoken with Dr.De Natasha Bence about her hospitalization. The patient had had a pulmonary illness and received some steroids. She says that her weight actually did not increase prior to admission. She did not have chest pain. She says that she began to feel poorly fairly rapidly. She never has edema. She does get some abdominal swelling when she becomes overweight. She has learned that her salt intake really has been excessive and this is now been adjusted. Since her hospitalization she is feeling better. Fortunately also she is living with family that can help her. Allergies  Allergen Reactions  . Aspirin Other (See Comments)    unknown  . Nsaids Other (See Comments)    unknown  . Penicillins Other (See Comments)    unknown    Current Outpatient Prescriptions  Medication Sig Dispense Refill  . ALPRAZolam (XANAX) 0.5 MG tablet Take 0.5 mg by mouth 3 (three) times daily as needed. For anxiety       . azithromycin (ZITHROMAX) 250 MG tablet Take 250 mg by mouth daily. 2 days left      . carvedilol (COREG) 25 MG tablet Take 12.5 mg by mouth 2 (two) times daily.      . cinacalcet (SENSIPAR) 30 MG tablet Take 30 mg by mouth daily.        . clopidogrel (PLAVIX) 75 MG tablet Take 75 mg by mouth daily.        . folic acid-vitamin b complex-vitamin c-selenium-zinc (DIALYVITE) 3 MG TABS Take 1 tablet by mouth daily.        . furosemide (LASIX) 80 MG tablet Take 80 mg by mouth 2 (two) times daily.        Marland Kitchen guaiFENesin (MUCINEX) 600 MG 12 hr tablet Take 600 mg by mouth 2 (two) times daily.      Marland Kitchen HYDROcodone-acetaminophen (LORTAB) 7.5-500 MG per tablet Take 1 tablet by mouth every 8 (eight) hours as needed. For pain       . insulin NPH (HUMULIN N,NOVOLIN N) 100 UNIT/ML injection Inject 20 Units into the skin daily before breakfast. 10 units every evening as  needed      . isosorbide mononitrate (IMDUR) 30 MG 24 hr tablet Take 30 mg by mouth 2 (two) times daily.       . pantoprazole (PROTONIX) 20 MG tablet Take 20 mg by mouth daily.      . pravastatin (PRAVACHOL) 40 MG tablet Take 1 tablet (40 mg total) by mouth every evening.  30 tablet  6  . sevelamer (RENVELA) 800 MG tablet Take 6 tablets with meals and 3 tablets with snacks        History   Social History  . Marital Status: Married    Spouse Name: RALPH    Number of Children: N/A  . Years of Education: N/A   Occupational History  . RETIRED     [employer]   Social History Main Topics  . Smoking status: Former Smoker -- 2.0 packs/day for 50 years    Types: Cigarettes    Quit date: 09/23/2005  . Smokeless tobacco: Never Used   Comment: SMOKED FROM AGE 89 UNTIL 2007  . Alcohol Use: No     NO ALCOHOL SINCE THE 70'S  . Drug Use: No  . Sexually Active: Not on file   Other Topics Concern  . Not on  file   Social History Narrative  . No narrative on file    Family History  Problem Relation Age of Onset  . Diabetes Mother   . Kidney disease Father     Past Medical History  Diagnosis Date  . Mitral regurgitation     Mild-to-moderate, echo, January, 2010 / moderate, e eccentric jet, echo, March, 2012, moderate to moderately severe by bedside echocardiogram December 2012  . Ejection fraction     Ejection fraction 25-30%  . Dyslipidemia   . CAD (coronary artery disease)     Nuclear, January, 2010, no ischemia  . Hx of CABG     2007  . Cardiomyopathy     Ischemic status post coronary bypass grafting  . ESRD (end stage renal disease)     Dialysis, right arm shunt  / PCI tissue, interventional radiology September, 2010 / temporary catheter left subclavian, new AV fistula right arm maturing March, 201 t2  . Diabetes mellitus   . Anemia     Chronic  . PAD (peripheral artery disease)     Right axillofemoral bypass, Dr. Arbie Cookey status post redo right  axillofemoral bypass August 2012  . Aspirin allergy     Takes Plavix  . Hypertension   . Carotid bruit     May, 2011  . CHF (congestive heart failure)   . Hyperlipidemia   . Myocardial infarction   . Peripheral arterial disease   . Arthritis   . Carotid artery occlusion   . Heart murmur     Secondary to mitral regurgitation  . Anxiety     Past Surgical History  Procedure Date  . Coronary artery bypass graft  07/27/2006    [name]. [name], M.D  . Abdominal hysterectomy   . Laparoscopic salpingoopherectomy   . Appendectomy   . Fem-fem bypass 2003    left to right   . Right femoral bypass 2004  . Basal cell carcinoma excision 2004    REMOVAL FROM NOSE  . Arteriovenous graft placement right  11-30-2010,  05-20-2011    ROS  Patient denies fever, chills, headache, sweats, rash, change in vision, change in hearing, chest pain, nausea vomiting, urinary symptoms. She does have a mild persistent cough.  PHYSICAL EXAM Patient is frail but stable. She is oriented to person time and place. Affect is normal. Head is atraumatic. She is using her portable O2. She has carotid bruits. Lungs reveal a few scattered rhonchi. She has kyphosis. Cardiac exam reveals S1 and S2 and a systolic murmur. The abdomen is soft. There is no peripheral edema. She has no skin rashes. Filed Vitals:   11/08/11 0837  BP: 158/60  Pulse: 58  Height: 5\' 3"  (1.6 m)  Weight: 114 lb 12.8 oz (52.073 kg)  SpO2: 98%    ASSESSMENT & PLAN

## 2011-11-08 NOTE — Assessment & Plan Note (Signed)
Her recent echo shows that her EF continues to be reduced. I am not able to adjust her medicines any further at this time.

## 2011-11-08 NOTE — Assessment & Plan Note (Signed)
The patient continues on dialysis. No further workup.

## 2011-11-29 ENCOUNTER — Ambulatory Visit: Payer: Medicare Other | Admitting: Cardiology

## 2011-12-16 ENCOUNTER — Ambulatory Visit (INDEPENDENT_AMBULATORY_CARE_PROVIDER_SITE_OTHER): Payer: Medicare Other | Admitting: Cardiology

## 2011-12-16 ENCOUNTER — Encounter: Payer: Self-pay | Admitting: Cardiology

## 2011-12-16 VITALS — BP 179/69 | HR 90 | Ht 63.0 in | Wt 121.0 lb

## 2011-12-16 DIAGNOSIS — I5022 Chronic systolic (congestive) heart failure: Secondary | ICD-10-CM

## 2011-12-16 DIAGNOSIS — I2510 Atherosclerotic heart disease of native coronary artery without angina pectoris: Secondary | ICD-10-CM

## 2011-12-16 DIAGNOSIS — I428 Other cardiomyopathies: Secondary | ICD-10-CM

## 2011-12-16 NOTE — Assessment & Plan Note (Signed)
Coronary disease is stable. No change in therapy. 

## 2011-12-16 NOTE — Assessment & Plan Note (Signed)
Her overall fluid status is being well maintained with a combination of her careful attention to fluid at home and with dialysis. Overall she's stable. No change in therapy.

## 2011-12-16 NOTE — Progress Notes (Signed)
HPI Patient is actually doing well. She has now been a definite understanding of her fluid status. She is more active. She is very carefully watching her fluid intake and using a maximum of 32 ounces of fluid per day. At dialysis there has not been need to take off a great deal of fluid. She's not having any chest pain or shortness of breath. Her blood pressure and gentle runs on the high side. She does not take her medications on the day of dialysis yet her pressure still dropped with dialysis. I believe this is because she is maintaining her fluid status very carefully and there is not a lot of extra fluids and take off at this time.  Allergies  Allergen Reactions  . Aspirin Other (See Comments)    unknown  . Nsaids Other (See Comments)    unknown  . Penicillins Other (See Comments)    unknown    Current Outpatient Prescriptions  Medication Sig Dispense Refill  . ALPRAZolam (XANAX) 0.5 MG tablet Take 0.5 mg by mouth 3 (three) times daily as needed. For anxiety       . carvedilol (COREG) 25 MG tablet Take 12.5 mg by mouth 2 (two) times daily.      . cinacalcet (SENSIPAR) 30 MG tablet Take 30 mg by mouth daily.        . clopidogrel (PLAVIX) 75 MG tablet Take 75 mg by mouth daily.        . folic acid-vitamin b complex-vitamin c-selenium-zinc (DIALYVITE) 3 MG TABS Take 1 tablet by mouth daily.        . furosemide (LASIX) 80 MG tablet Take 80 mg by mouth 2 (two) times daily.        Marland Kitchen HYDROcodone-acetaminophen (LORTAB) 7.5-500 MG per tablet Take 1 tablet by mouth every 8 (eight) hours as needed. For pain       . insulin NPH (HUMULIN N,NOVOLIN N) 100 UNIT/ML injection Inject 40 Units into the skin daily before breakfast. 10 units every evening as needed      . isosorbide mononitrate (IMDUR) 30 MG 24 hr tablet Take 30 mg by mouth 2 (two) times daily.       . pantoprazole (PROTONIX) 20 MG tablet Take 20 mg by mouth daily.      . pravastatin (PRAVACHOL) 40 MG tablet Take 1 tablet (40 mg total)  by mouth every evening.  30 tablet  6  . sevelamer (RENVELA) 800 MG tablet Take 6 tablets with meals and 3 tablets with snacks        History   Social History  . Marital Status: Married    Spouse Name: RALPH    Number of Children: N/A  . Years of Education: N/A   Occupational History  . RETIRED     [employer]   Social History Main Topics  . Smoking status: Former Smoker -- 2.0 packs/day for 50 years    Types: Cigarettes    Quit date: 09/23/2005  . Smokeless tobacco: Never Used   Comment: SMOKED FROM AGE 34 UNTIL 2007  . Alcohol Use: No     NO ALCOHOL SINCE THE 70'S  . Drug Use: No  . Sexually Active: Not on file   Other Topics Concern  . Not on file   Social History Narrative  . No narrative on file    Family History  Problem Relation Age of Onset  . Diabetes Mother   . Kidney disease Father     Past Medical  History  Diagnosis Date  . Mitral regurgitation     Mild-to-moderate, echo, January, 2010 / moderate, e eccentric jet, echo, March, 2012, moderate to moderately severe by bedside echocardiogram December 2012  . Ejection fraction     Ejection fraction 25-30% January, 2013  . Dyslipidemia   . CAD (coronary artery disease)     Nuclear, January, 2010, no ischemia  . Hx of CABG     2007  . Cardiomyopathy     Ischemic status post coronary bypass grafting  . ESRD (end stage renal disease)     Dialysis, right arm shunt  / PCI tissue, interventional radiology September, 2010 / temporary catheter left subclavian, new AV fistula right arm maturing March, 201 t2  . Diabetes mellitus   . Anemia     Chronic  . PAD (peripheral artery disease)     Right axillofemoral bypass, Dr. Arbie Cookey status post redo right axillofemoral bypass August 2012  . Aspirin allergy     Takes Plavix  . Hypertension   . Carotid artery disease     Doppler, May, 2012, 50-69% R. ICA, 50-69% LICA  . CHF (congestive heart failure)   . Hyperlipidemia   . Arthritis   .  Anxiety     Past Surgical History  Procedure Date  . Coronary artery bypass graft  07/27/2006    [name]. [name], M.D  . Abdominal hysterectomy   . Laparoscopic salpingoopherectomy   . Appendectomy   . Fem-fem bypass 2003    left to right   . Right femoral bypass 2004  . Basal cell carcinoma excision 2004    REMOVAL FROM NOSE  . Arteriovenous graft placement right  11-30-2010,  05-20-2011    ROS   Patient denies fever, chills, headache, sweats, rash, change in vision, change in hearing, chest pain, cough, nausea vomiting, urinary symptoms. All other systems are reviewed and are negative.  PHYSICAL EXAM  Patient is oriented to person time and place. Affect is normal. She has kyphosis of the spine. Lungs are clear. Respiratory effort is nonlabored. Cardiac exam reveals S1 and S2. There no clicks or significant murmurs. Abdomen is soft. She has no peripheral edema.  Filed Vitals:   12/16/11 0858  BP: 179/69  Pulse: 90  Height: 5\' 3"  (1.6 m)  Weight: 121 lb (54.885 kg)     ASSESSMENT & PLAN

## 2011-12-16 NOTE — Patient Instructions (Signed)
Follow up in 3 months. Your physician recommends that you continue on your current medications as directed. Please refer to the Current Medication list given to you today. 

## 2011-12-16 NOTE — Assessment & Plan Note (Signed)
Patient is stable. I am not able to adjust her medications any further.

## 2012-01-22 ENCOUNTER — Other Ambulatory Visit: Payer: Self-pay | Admitting: Nephrology

## 2012-01-22 DIAGNOSIS — I9589 Other hypotension: Secondary | ICD-10-CM

## 2012-01-22 DIAGNOSIS — I509 Heart failure, unspecified: Secondary | ICD-10-CM

## 2012-01-23 ENCOUNTER — Other Ambulatory Visit: Payer: Self-pay

## 2012-01-23 ENCOUNTER — Other Ambulatory Visit (INDEPENDENT_AMBULATORY_CARE_PROVIDER_SITE_OTHER): Payer: Medicare Other

## 2012-01-23 DIAGNOSIS — I5022 Chronic systolic (congestive) heart failure: Secondary | ICD-10-CM

## 2012-01-23 DIAGNOSIS — I2510 Atherosclerotic heart disease of native coronary artery without angina pectoris: Secondary | ICD-10-CM

## 2012-01-23 DIAGNOSIS — I509 Heart failure, unspecified: Secondary | ICD-10-CM

## 2012-01-23 DIAGNOSIS — I428 Other cardiomyopathies: Secondary | ICD-10-CM

## 2012-01-23 DIAGNOSIS — I9589 Other hypotension: Secondary | ICD-10-CM

## 2012-01-24 ENCOUNTER — Telehealth: Payer: Self-pay | Admitting: Cardiology

## 2012-01-24 NOTE — Telephone Encounter (Signed)
Mrs. [name] called the office stating that Harriett called her late on Thursday, May 2 and told her that Dr. Andee Lineman would Call her on Friday about the Echo results that she had on 01/23/2012.  Mrs. Corum was wanting to know if she would Be receiving a telephone call today.  Please call her cell # . [phone]

## 2012-01-26 NOTE — Telephone Encounter (Signed)
Please notify patient no significant change in ECHO- Dr. Myrtis Ser saw her recently in clinic and according to his note, she was stable. Is there any chance in clinical status?

## 2012-01-27 NOTE — Telephone Encounter (Signed)
Patient notified of below.  Copy of results forwarded to Dr. Kristian Covey.  Patient reports no changes since seeing Dr. Myrtis Ser last.

## 2012-03-11 ENCOUNTER — Ambulatory Visit: Payer: Medicare Other | Admitting: Cardiology

## 2012-03-23 ENCOUNTER — Other Ambulatory Visit: Payer: Self-pay | Admitting: Cardiology

## 2012-03-23 DIAGNOSIS — I509 Heart failure, unspecified: Secondary | ICD-10-CM

## 2012-03-23 HISTORY — DX: Heart failure, unspecified: I50.9

## 2012-03-24 DIAGNOSIS — R0602 Shortness of breath: Secondary | ICD-10-CM

## 2012-03-25 DIAGNOSIS — I4891 Unspecified atrial fibrillation: Secondary | ICD-10-CM

## 2012-04-01 DIAGNOSIS — I509 Heart failure, unspecified: Secondary | ICD-10-CM

## 2012-04-07 ENCOUNTER — Ambulatory Visit (INDEPENDENT_AMBULATORY_CARE_PROVIDER_SITE_OTHER): Payer: Medicare Other | Admitting: *Deleted

## 2012-04-07 DIAGNOSIS — I4891 Unspecified atrial fibrillation: Secondary | ICD-10-CM | POA: Insufficient documentation

## 2012-04-07 DIAGNOSIS — I5022 Chronic systolic (congestive) heart failure: Secondary | ICD-10-CM

## 2012-04-07 DIAGNOSIS — Z7901 Long term (current) use of anticoagulants: Secondary | ICD-10-CM | POA: Insufficient documentation

## 2012-04-07 LAB — POCT INR: INR: 6

## 2012-04-10 ENCOUNTER — Ambulatory Visit (INDEPENDENT_AMBULATORY_CARE_PROVIDER_SITE_OTHER): Payer: Medicare Other | Admitting: *Deleted

## 2012-04-10 DIAGNOSIS — I5022 Chronic systolic (congestive) heart failure: Secondary | ICD-10-CM

## 2012-04-10 DIAGNOSIS — I4891 Unspecified atrial fibrillation: Secondary | ICD-10-CM

## 2012-04-10 DIAGNOSIS — Z7901 Long term (current) use of anticoagulants: Secondary | ICD-10-CM

## 2012-04-10 LAB — POCT INR: INR: 2

## 2012-04-13 ENCOUNTER — Ambulatory Visit (INDEPENDENT_AMBULATORY_CARE_PROVIDER_SITE_OTHER): Payer: Medicare Other | Admitting: *Deleted

## 2012-04-13 DIAGNOSIS — I5022 Chronic systolic (congestive) heart failure: Secondary | ICD-10-CM

## 2012-04-13 DIAGNOSIS — I4891 Unspecified atrial fibrillation: Secondary | ICD-10-CM

## 2012-04-13 DIAGNOSIS — Z7901 Long term (current) use of anticoagulants: Secondary | ICD-10-CM

## 2012-04-13 LAB — POCT INR: INR: 3.5

## 2012-04-13 MED ORDER — WARFARIN SODIUM 2 MG PO TABS: 2.0000 mg | ORAL_TABLET | Freq: Every day | ORAL | Status: DC

## 2012-04-15 ENCOUNTER — Telehealth: Payer: Self-pay

## 2012-04-15 DIAGNOSIS — R0989 Other specified symptoms and signs involving the circulatory and respiratory systems: Secondary | ICD-10-CM

## 2012-04-15 DIAGNOSIS — L539 Erythematous condition, unspecified: Secondary | ICD-10-CM

## 2012-04-15 DIAGNOSIS — R208 Other disturbances of skin sensation: Secondary | ICD-10-CM

## 2012-04-15 NOTE — Telephone Encounter (Signed)
Pt aware of appt scheduled for tomorrow at 1pm for lab studies and to see NP at 2pm per Carol's instructions. awt

## 2012-04-15 NOTE — Telephone Encounter (Signed)
Phone call from pt. Reports that she saw Dr. Emilio Math, this AM, for ingrown toenail.  Stated her left foot has been red and has had decreased sensation for past week, and Dr. Pricilla Holm stated he could not find a pulse, and recommended she get in to see Dr. Arbie Cookey ASAP.   Pt. Denies any open sores.  Denies pain at present, stating she had pain until he treated her ingrown toenail.  Discussed w/ Dr. Edilia Bo.  Recommends pt. Have ABI's and duplex of graft and appt. This week with Nurse Practitioner.  Stated pt. should not wait until Dr.Early returns next week.

## 2012-04-16 ENCOUNTER — Encounter: Payer: Self-pay | Admitting: Neurosurgery

## 2012-04-16 ENCOUNTER — Telehealth: Payer: Self-pay

## 2012-04-16 ENCOUNTER — Encounter (INDEPENDENT_AMBULATORY_CARE_PROVIDER_SITE_OTHER): Payer: Medicare Other | Admitting: *Deleted

## 2012-04-16 ENCOUNTER — Other Ambulatory Visit: Payer: Self-pay

## 2012-04-16 ENCOUNTER — Encounter (HOSPITAL_COMMUNITY): Payer: Self-pay | Admitting: Pharmacy Technician

## 2012-04-16 ENCOUNTER — Ambulatory Visit (INDEPENDENT_AMBULATORY_CARE_PROVIDER_SITE_OTHER): Payer: Medicare Other | Admitting: Neurosurgery

## 2012-04-16 VITALS — BP 142/54 | HR 75 | Resp 16 | Ht 63.0 in | Wt 124.3 lb

## 2012-04-16 DIAGNOSIS — R208 Other disturbances of skin sensation: Secondary | ICD-10-CM

## 2012-04-16 DIAGNOSIS — R209 Unspecified disturbances of skin sensation: Secondary | ICD-10-CM

## 2012-04-16 DIAGNOSIS — I739 Peripheral vascular disease, unspecified: Secondary | ICD-10-CM

## 2012-04-16 DIAGNOSIS — R0989 Other specified symptoms and signs involving the circulatory and respiratory systems: Secondary | ICD-10-CM

## 2012-04-16 DIAGNOSIS — I7092 Chronic total occlusion of artery of the extremities: Secondary | ICD-10-CM

## 2012-04-16 DIAGNOSIS — L539 Erythematous condition, unspecified: Secondary | ICD-10-CM

## 2012-04-16 NOTE — Telephone Encounter (Signed)
[facility] @ Vein and Vascular with OK to hold coumadin.  Pt notified.

## 2012-04-16 NOTE — Progress Notes (Signed)
[facility] OF [place] PAD/PVD Office Note  CC: Patient called with left lower extremity pain Referring Physician: Early  History of Present Illness: 71 year old female patient of Dr. Arbie Cookey is who is status post a right axillary to femoral bypass that was a redo in August 2012. The patient has done well until she  visited Dr. Pricilla Holm who is a podiatrist for an ingrown toenail that told the patient she should followup here due to the fact he could not find pulses in her lower extremities. The patient states her left foot has been red and painful for about 2 weeks, she has no real complaints of right-sided pain. The patient states her left lower extremity hurts at rest as well as with exercise. She does have an open ulceration on her left shin that she states was a mosquito bite 3 weeks ago.  Past Medical History  Diagnosis Date  . Mitral regurgitation     Mild-to-moderate, echo, January, 2010 / moderate, e eccentric jet, echo, March, 2012, moderate to moderately severe by bedside echocardiogram December 2012  . Ejection fraction     Ejection fraction 25-30% January, 2013  . Dyslipidemia   . CAD (coronary artery disease)     Nuclear, January, 2010, no ischemia  . Hx of CABG     2007  . Cardiomyopathy     Ischemic status post coronary bypass grafting  . ESRD (end stage renal disease)     Dialysis, right arm shunt  / PCI tissue, interventional radiology September, 2010 / temporary catheter left subclavian, new AV fistula right arm maturing March, 201 t2  . Diabetes mellitus   . Anemia     Chronic  . PAD (peripheral artery disease)     Right axillofemoral bypass, Dr. Arbie Cookey status post redo right axillofemoral bypass August 2012  . Aspirin allergy     Takes Plavix  . Hypertension   . Carotid artery disease     Doppler, May, 2012, 50-69% R. ICA, 50-69% LICA  . Hyperlipidemia   . Arthritis   . Anxiety   . Irregular heart beat   . CHF (congestive heart failure) 03/23/12     ROS: [x]  Positive   [ ]  Denies    General: [ ]  Weight loss, [ ]  Fever, [ ]  chills Neurologic: [ ]  Dizziness, [ ]  Blackouts, [ ]  Seizure [ ]  Stroke, [ ]  "Mini stroke", [ ]  Slurred speech, [ ]  Temporary blindness; [ ]  weakness in arms or legs, [ ]  Hoarseness Cardiac: [ ]  Chest pain/pressure, [x ] Shortness of breath at rest [ x] Shortness of breath with exertion, [ x] Atrial fibrillation or irregular heartbeat Vascular: [x ] Pain in legs with walking, [ x] Pain in legs at rest, [ ]  Pain in legs at night,  [ ]  Non-healing ulcer, [ ]  Blood clot in vein/DVT,   Pulmonary: [ ]  Home oxygen, [ ]  Productive cough, [ ]  Coughing up blood, [ ]  Asthma,  [x ] Wheezing Musculoskeletal:  [ ]  Arthritis, [ ]  Low back pain, [ ]  Joint pain Hematologic: [ ]  Easy Bruising, [ ]  Anemia; [ ]  Hepatitis Gastrointestinal: [ ]  Blood in stool, [ ]  Gastroesophageal Reflux/heartburn, [ ]  Trouble swallowing Urinary: [ ]  chronic Kidney disease, [ ]  on HD - [ ]  MWF or [ ]  TTHS, [ ]  Burning with urination, [ ]  Difficulty urinating Skin: [ ]  Rashes, [ ]  Wounds Psychological: [ ]  Anxiety, [ ]  Depression   Social History History  Substance Use Topics  .  Smoking status: Former Smoker -- 2.0 packs/day for 50 years    Types: Cigarettes    Quit date: 09/23/2005  . Smokeless tobacco: Never Used   Comment: SMOKED FROM AGE 74 UNTIL 2007  . Alcohol Use: No     NO ALCOHOL SINCE THE 70'S    Family History Family History  Problem Relation Age of Onset  . Diabetes Mother   . Kidney disease Father     Allergies  Allergen Reactions  . Septra (Sulfamethoxazole W-Trimethoprim) Nausea And Vomiting  . Aspirin Other (See Comments)    unknown  . Nsaids Other (See Comments)    unknown  . Penicillins Other (See Comments)    unknown    Current Outpatient Prescriptions  Medication Sig Dispense Refill  . albuterol (PROVENTIL,VENTOLIN) 2 MG/5ML syrup Take 2 mg by mouth as needed.      . ALPRAZolam (XANAX) 0.5 MG tablet Take  0.5 mg by mouth 3 (three) times daily as needed. For anxiety       . amiodarone (PACERONE) 200 MG tablet Take 200 mg by mouth 2 (two) times daily.      . cinacalcet (SENSIPAR) 30 MG tablet Take 30 mg by mouth daily.        . folic acid-vitamin b complex-vitamin c-selenium-zinc (DIALYVITE) 3 MG TABS Take 1 tablet by mouth daily.        . furosemide (LASIX) 80 MG tablet Take 80 mg by mouth 2 (two) times daily.        Marland Kitchen HYDROcodone-acetaminophen (LORTAB) 7.5-500 MG per tablet Take 1 tablet by mouth every 8 (eight) hours as needed. For pain       . insulin NPH (HUMULIN N,NOVOLIN N) 100 UNIT/ML injection Inject 40 Units into the skin daily before breakfast. 10 units every evening as needed      . isosorbide mononitrate (IMDUR) 30 MG 24 hr tablet Take 30 mg by mouth 2 (two) times daily.       . pantoprazole (PROTONIX) 20 MG tablet Take 20 mg by mouth daily.      . sevelamer (RENVELA) 800 MG tablet Take 6 tablets with meals and 3 tablets with snacks      . warfarin (COUMADIN) 2 MG tablet Take 1 tablet (2 mg total) by mouth daily.  30 tablet  3  . carvedilol (COREG) 25 MG tablet Take 12.5 mg by mouth 2 (two) times daily.      . pravastatin (PRAVACHOL) 40 MG tablet Take 1 tablet (40 mg total) by mouth every evening.  30 tablet  6    Physical Examination  Filed Vitals:   04/16/12 1357  BP: 142/54  Pulse: 75  Resp: 16    Body mass index is 22.02 kg/(m^2).  General:  WDWN in NAD Gait: Normal HEENT: WNL Eyes: Pupils equal Pulmonary: normal non-labored breathing , without Rales, rhonchi,  wheezing Cardiac: RRR, without  Murmurs, rubs or gallops; No carotid bruits Abdomen: soft, NT, no masses Skin: no rashes, ulcers noted Vascular Exam/Pulses: Dr. feels examined the patient states the right side asked him bypasses functional with palpable femoral pulses, lower extremity pulses are not palpated  Extremities without ischemic changes, no Gangrene , no cellulitis; no open wounds;  Musculoskeletal:  no muscle wasting or atrophy  Neurologic: A&O X 3; Appropriate Affect ; SENSATION: normal; MOTOR FUNCTION:  moving all extremities equally. Speech is fluent/normal  Non-Invasive Vascular Imaging: ABIs today show 0.42 on the right and monophasic 0.46 on the left and monophasic with discoloration open  ulceration left lower extremity, no previous ABIs to review, Dr. Darrick Penna reviewed studies and spoke with the patient.  ASSESSMENT/PLAN: The patient with increasing left lower extremity pain with very poor vascularization bilaterally in the lower extremities. Per Dr. Darrick Penna we will set her up with an arteriogram with runoff and possible intervention with Dr. Arbie Cookey for 04/22/2012. Her follow up here be pending that appointment. The patient's in agreement with this, her questions were encouraged and answered.  Lauree Chandler ANP  Clinic M.D.: [facility]

## 2012-04-16 NOTE — Telephone Encounter (Signed)
Pt scheduled for an angiogram on 04/22/12.  They are requesting that she come off of her coumadin for 4 days prior (starting tomorrow, 04/17/12).  Ok per Dr Patty Sermons (dod) for [name] to come off the coumadin for 4 days prior to the angiogram.  Dr Patty Sermons states that he doesn't feel she needs to be bridged with lovenox.  LM for Okey Regal at [facility].

## 2012-04-17 ENCOUNTER — Telehealth: Payer: Self-pay | Admitting: *Deleted

## 2012-04-17 NOTE — Procedures (Unsigned)
LOWER EXTREMITY ARTERIAL DUPLEX  INDICATION:  Left lower extremity pain.  HISTORY: Diabetes:  Yes. Cardiac: Hypertension:  Yes. Smoking: Previous Surgery:  History of axillary-to-femoral bypass placed 2012.  SINGLE LEVEL ARTERIAL EXAM                         RIGHT                LEFT Brachial: Anterior tibial: Posterior tibial: Peroneal: Ankle/Brachial Index:   0.42                 0.46  LOWER EXTREMITY ARTERIAL DUPLEX EXAM  DUPLEX: 1. Patent right axillary-to-femoral bypass graft with triphasic     waveforms noted throughout. 2. Patent proximal portion of the right superficial femoral artery     with a loss of color-flow noted in the mid to distal portion. 3. There is a minimal amount of flow noted in the right popliteal     artery with a velocity of 36 cm/s with monophasic waveforms noted. 4. Left superficial femoral artery is shown with monophasic waveforms     suggestive of a more proximal stenosis.  IMPRESSION: 1. Bilateral ankle brachial indices are indicative of severe arterial     disease. 2. Patent right axillary-to-femoral bypass graft with triphasic     waveforms noted. 3. Severely diseased right superficial femoral artery and monophasic     waveforms noted throughout the left superficial femoral artery     suggestive of a more proximal stenosis.  ___________________________________________ Larina Earthly, M.D.  EM/MEDQ  D:  04/17/2012  T:  04/17/2012  Job:  [id number]

## 2012-04-17 NOTE — Telephone Encounter (Signed)
Particia Jasper RN [facility] called.  She did not do INR check today because today is pt's last dose of coumadin before angiogram on 7/31.  Pt is aware to hold coumadin starting tonight and resume after procedure.  [facility] will recheck INR on 8/6.

## 2012-04-21 MED ORDER — SODIUM CHLORIDE 0.9 % IV SOLN: INTRAVENOUS | Status: DC

## 2012-04-22 ENCOUNTER — Encounter (HOSPITAL_COMMUNITY)
Admission: RE | Disposition: A | Discharge status: Home / Self Care | Payer: Self-pay | Source: Ambulatory Visit | Attending: Vascular Surgery

## 2012-04-22 ENCOUNTER — Ambulatory Visit (HOSPITAL_COMMUNITY)
Admission: RE | Admit: 2012-04-22 | Discharge: 2012-04-22 | Disposition: A | Discharge status: Home / Self Care | Payer: Medicare Other | Source: Ambulatory Visit | Attending: Vascular Surgery | Admitting: Vascular Surgery

## 2012-04-22 DIAGNOSIS — E785 Hyperlipidemia, unspecified: Secondary | ICD-10-CM | POA: Insufficient documentation

## 2012-04-22 DIAGNOSIS — I10 Essential (primary) hypertension: Secondary | ICD-10-CM | POA: Insufficient documentation

## 2012-04-22 DIAGNOSIS — I059 Rheumatic mitral valve disease, unspecified: Secondary | ICD-10-CM | POA: Insufficient documentation

## 2012-04-22 DIAGNOSIS — E119 Type 2 diabetes mellitus without complications: Secondary | ICD-10-CM | POA: Insufficient documentation

## 2012-04-22 DIAGNOSIS — I2510 Atherosclerotic heart disease of native coronary artery without angina pectoris: Secondary | ICD-10-CM | POA: Insufficient documentation

## 2012-04-22 DIAGNOSIS — I70219 Atherosclerosis of native arteries of extremities with intermittent claudication, unspecified extremity: Secondary | ICD-10-CM

## 2012-04-22 DIAGNOSIS — I70229 Atherosclerosis of native arteries of extremities with rest pain, unspecified extremity: Secondary | ICD-10-CM | POA: Insufficient documentation

## 2012-04-22 HISTORY — PX: ABDOMINAL AORTAGRAM: SHX5454

## 2012-04-22 LAB — POCT I-STAT, CHEM 8
Calcium, Ion: 1.2 mmol/L (ref 1.13–1.30)
Chloride: 102 mEq/L (ref 96–112)
Creatinine, Ser: 4.8 mg/dL — ABNORMAL HIGH (ref 0.50–1.10)
Glucose, Bld: 107 mg/dL — ABNORMAL HIGH (ref 70–99)
HCT: 32 % — ABNORMAL LOW (ref 36.0–46.0)

## 2012-04-22 LAB — PROTIME-INR: INR: 1.15 (ref 0.00–1.49)

## 2012-04-22 SURGERY — ABDOMINAL AORTAGRAM
Anesthesia: LOCAL

## 2012-04-22 MED ORDER — NITROGLYCERIN 0.2 MG/ML ON CALL CATH LAB
INTRAVENOUS | Status: AC
  Filled 2012-04-22: qty 1

## 2012-04-22 MED ORDER — SODIUM CHLORIDE 0.9 % IV SOLN: 250.0000 mL | INTRAVENOUS | Status: DC | PRN

## 2012-04-22 MED ORDER — SODIUM CHLORIDE 0.9 % IJ SOLN: 3.0000 mL | INTRAMUSCULAR | Status: DC | PRN

## 2012-04-22 MED ORDER — ONDANSETRON HCL 4 MG/2ML IJ SOLN: 4.0000 mg | Freq: Four times a day (QID) | INTRAMUSCULAR | Status: DC | PRN

## 2012-04-22 MED ORDER — HEPARIN (PORCINE) IN NACL 2-0.9 UNIT/ML-% IJ SOLN
INTRAMUSCULAR | Status: AC
  Filled 2012-04-22: qty 1000

## 2012-04-22 MED ORDER — LIDOCAINE HCL (PF) 1 % IJ SOLN
INTRAMUSCULAR | Status: AC
  Filled 2012-04-22: qty 30

## 2012-04-22 MED ORDER — LABETALOL HCL 5 MG/ML IV SOLN: 10.0000 mg | Freq: Once | INTRAVENOUS | Status: DC

## 2012-04-22 MED ORDER — SODIUM CHLORIDE 0.9 % IJ SOLN: 3.0000 mL | Freq: Two times a day (BID) | INTRAMUSCULAR | Status: DC

## 2012-04-22 MED ORDER — OXYCODONE-ACETAMINOPHEN 5-325 MG PO TABS: 1.0000 | ORAL_TABLET | ORAL | Status: DC | PRN

## 2012-04-22 MED ORDER — ACETAMINOPHEN 325 MG PO TABS: 650.0000 mg | ORAL_TABLET | ORAL | Status: DC | PRN

## 2012-04-22 MED ORDER — HEPARIN SODIUM (PORCINE) 1000 UNIT/ML IJ SOLN
INTRAMUSCULAR | Status: AC
  Filled 2012-04-22: qty 1

## 2012-04-22 MED ORDER — LABETALOL HCL 5 MG/ML IV SOLN
INTRAVENOUS | Status: AC
  Filled 2012-04-22: qty 4

## 2012-04-22 NOTE — Progress Notes (Signed)
LEFT BRACHIAL SITE WITH HEMATOMA NOTED AND DR LAWSON NOTIFIED AND PER DR LAWSON WILL HOLD PRESSURE TO SITED FOR 30 MIN

## 2012-04-22 NOTE — Op Note (Signed)
OPERATIVE REPORT  DATE OF SURGERY: 04/22/2012  PATIENT: [name], 71 y.o. female MRN: [id number]  DOB: 1941-03-25  PRE-OPERATIVE DIAGNOSIS: Bilateral lower extremity arterial insufficiency left greater than right  POST-OPERATIVE DIAGNOSIS:  Same  PROCEDURE: Aortogram and left leg runoff via left brachial approach  SURGEON:  Gretta Began, M.D.   ANESTHESIA:  1% lidocaine local  EBL: Minimal ml     BLOOD ADMINISTERED: None  DRAINS: None  SPECIMEN: None  COUNTS CORRECT:  YES  PLAN OF CARE: Holding area   PATIENT DISPOSITION:  PACU - hemodynamically stable  PROCEDURE DETAILS: Patient was taken to prevent the cath placed supine position where the area of the left arm was prepped and draped in sterile fashion using ultrasound guidance and a single wall puncture the left brachial artery was entered with an 18-gauge needle and the guidewire was passed centrally. There was significant tortuosity and a Kumpe catheter was used to negotiate down into the descending arch. 5 French sheath was passed over the guidewire. A Glidewire was required to a position that KI guidewire down the descending thoracic aorta to the level of the renal arteries. The pigtail catheter is positioned at the level of the renal arteries. AP projections were undertaken. 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 The aorta was severely narrowed and atherosclerotic and did have a subtotal occlusion in the left common iliac artery and severe stenosis throughout the external iliac artery. Runoff was also obtained the pigtail injection on the left. This revealed patency with diffuse calcification and disease throughout the superficial femoral artery but no evidence of severe stenosis. Popliteal likewise was diseased but no severe stenosis. There was occlusion of the anterior tibial artery just past its takeoff and there was patency of  the peroneal and posterior tibial throughout the calf. There was reconstitution of the anterior tibial artery via peroneal collaterals just above the ankle extending into the foot. There was extreme calcification in the aortic arch and with the difficulty negotiating down into the descending thoracic incision was made not to try to access the innominate for selective injections into her asked them off bypass on the right. It was felt this would put her at significant risk for stroke. She did have a 2+ to 3+ right femoral pulse from a patent right accident bypass. 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 04/22/2012 4:38 PM

## 2012-04-22 NOTE — Progress Notes (Signed)
Patient ID: [name], female   DOB: 1941/03/17, 71 y.o.   MRN: [id number] Asked to see pt re hematoma post angio per Dr Arbie Cookey. LUE with 3+ radial pulse palpable. Hematome soft and diffused into tissues. Mild numbness in fingers. Good strength. She says it feels much better post manual compression.   OK to D/C home. Call if hand becomes numb or Hematoma enlarges.

## 2012-04-22 NOTE — H&P (View-Only) (Signed)
[facility] OF [place] PAD/PVD Office Note  CC: Patient called with left lower extremity pain Referring Physician: Early  History of Present Illness: 70-year-old female patient of Dr. Early is who is status post a right axillary to femoral bypass that was a redo in August 2012. The patient has done well until she  visited Dr. Tucker who is a podiatrist for an ingrown toenail that told the patient she should followup here due to the fact he could not find pulses in her lower extremities. The patient states her left foot has been red and painful for about 2 weeks, she has no real complaints of right-sided pain. The patient states her left lower extremity hurts at rest as well as with exercise. She does have an open ulceration on her left shin that she states was a mosquito bite 3 weeks ago.  Past Medical History  Diagnosis Date  . Mitral regurgitation     Mild-to-moderate, echo, January, 2010 / moderate, e eccentric jet, echo, March, 2012, moderate to moderately severe by bedside echocardiogram December 2012  . Ejection fraction     Ejection fraction 25-30% January, 2013  . Dyslipidemia   . CAD (coronary artery disease)     Nuclear, January, 2010, no ischemia  . Hx of CABG     2007  . Cardiomyopathy     Ischemic status post coronary bypass grafting  . ESRD (end stage renal disease)     Dialysis, right arm shunt  / PCI tissue, interventional radiology September, 2010 / temporary catheter left subclavian, new AV fistula right arm maturing March, 201 t2  . Diabetes mellitus   . Anemia     Chronic  . PAD (peripheral artery disease)     Right axillofemoral bypass, Dr. Early status post redo right axillofemoral bypass August 2012  . Aspirin allergy     Takes Plavix  . Hypertension   . Carotid artery disease     Doppler, May, 2012, 50-69% R. ICA, 50-69% LICA  . Hyperlipidemia   . Arthritis   . Anxiety   . Irregular heart beat   . CHF (congestive heart failure) 03/23/12     ROS: [x] Positive   [ ] Denies    General: [ ] Weight loss, [ ] Fever, [ ] chills Neurologic: [ ] Dizziness, [ ] Blackouts, [ ] Seizure [ ] Stroke, [ ] "Mini stroke", [ ] Slurred speech, [ ] Temporary blindness; [ ] weakness in arms or legs, [ ] Hoarseness Cardiac: [ ] Chest pain/pressure, [x ] Shortness of breath at rest [ x] Shortness of breath with exertion, [ x] Atrial fibrillation or irregular heartbeat Vascular: [x ] Pain in legs with walking, [ x] Pain in legs at rest, [ ] Pain in legs at night,  [ ] Non-healing ulcer, [ ] Blood clot in vein/DVT,   Pulmonary: [ ] Home oxygen, [ ] Productive cough, [ ] Coughing up blood, [ ] Asthma,  [x ] Wheezing Musculoskeletal:  [ ] Arthritis, [ ] Low back pain, [ ] Joint pain Hematologic: [ ] Easy Bruising, [ ] Anemia; [ ] Hepatitis Gastrointestinal: [ ] Blood in stool, [ ] Gastroesophageal Reflux/heartburn, [ ] Trouble swallowing Urinary: [ ] chronic Kidney disease, [ ] on HD - [ ] MWF or [ ] TTHS, [ ] Burning with urination, [ ] Difficulty urinating Skin: [ ] Rashes, [ ] Wounds Psychological: [ ] Anxiety, [ ] Depression   Social History History  Substance Use Topics  .   Smoking status: Former Smoker -- 2.0 packs/day for 50 years    Types: Cigarettes    Quit date: 09/23/2005  . Smokeless tobacco: Never Used   Comment: SMOKED FROM AGE 16 UNTIL 2007  . Alcohol Use: No     NO ALCOHOL SINCE THE 70'S    Family History Family History  Problem Relation Age of Onset  . Diabetes Mother   . Kidney disease Father     Allergies  Allergen Reactions  . Septra (Sulfamethoxazole W-Trimethoprim) Nausea And Vomiting  . Aspirin Other (See Comments)    unknown  . Nsaids Other (See Comments)    unknown  . Penicillins Other (See Comments)    unknown    Current Outpatient Prescriptions  Medication Sig Dispense Refill  . albuterol (PROVENTIL,VENTOLIN) 2 MG/5ML syrup Take 2 mg by mouth as needed.      . ALPRAZolam (XANAX) 0.5 MG tablet Take  0.5 mg by mouth 3 (three) times daily as needed. For anxiety       . amiodarone (PACERONE) 200 MG tablet Take 200 mg by mouth 2 (two) times daily.      . cinacalcet (SENSIPAR) 30 MG tablet Take 30 mg by mouth daily.        . folic acid-vitamin b complex-vitamin c-selenium-zinc (DIALYVITE) 3 MG TABS Take 1 tablet by mouth daily.        . furosemide (LASIX) 80 MG tablet Take 80 mg by mouth 2 (two) times daily.        . HYDROcodone-acetaminophen (LORTAB) 7.5-500 MG per tablet Take 1 tablet by mouth every 8 (eight) hours as needed. For pain       . insulin NPH (HUMULIN N,NOVOLIN N) 100 UNIT/ML injection Inject 40 Units into the skin daily before breakfast. 10 units every evening as needed      . isosorbide mononitrate (IMDUR) 30 MG 24 hr tablet Take 30 mg by mouth 2 (two) times daily.       . pantoprazole (PROTONIX) 20 MG tablet Take 20 mg by mouth daily.      . sevelamer (RENVELA) 800 MG tablet Take 6 tablets with meals and 3 tablets with snacks      . warfarin (COUMADIN) 2 MG tablet Take 1 tablet (2 mg total) by mouth daily.  30 tablet  3  . carvedilol (COREG) 25 MG tablet Take 12.5 mg by mouth 2 (two) times daily.      . pravastatin (PRAVACHOL) 40 MG tablet Take 1 tablet (40 mg total) by mouth every evening.  30 tablet  6    Physical Examination  Filed Vitals:   04/16/12 1357  BP: 142/54  Pulse: 75  Resp: 16    Body mass index is 22.02 kg/(m^2).  General:  WDWN in NAD Gait: Normal HEENT: WNL Eyes: Pupils equal Pulmonary: normal non-labored breathing , without Rales, rhonchi,  wheezing Cardiac: RRR, without  Murmurs, rubs or gallops; No carotid bruits Abdomen: soft, NT, no masses Skin: no rashes, ulcers noted Vascular Exam/Pulses: Dr. feels examined the patient states the right side asked him bypasses functional with palpable femoral pulses, lower extremity pulses are not palpated  Extremities without ischemic changes, no Gangrene , no cellulitis; no open wounds;  Musculoskeletal:  no muscle wasting or atrophy  Neurologic: A&O X 3; Appropriate Affect ; SENSATION: normal; MOTOR FUNCTION:  moving all extremities equally. Speech is fluent/normal  Non-Invasive Vascular Imaging: ABIs today show 0.42 on the right and monophasic 0.46 on the left and monophasic with discoloration open   ulceration left lower extremity, no previous ABIs to review, Dr. Fields reviewed studies and spoke with the patient.  ASSESSMENT/PLAN: The patient with increasing left lower extremity pain with very poor vascularization bilaterally in the lower extremities. Per Dr. Fields we will set her up with an arteriogram with runoff and possible intervention with Dr. Early for 04/22/2012. Her follow up here be pending that appointment. The patient's in agreement with this, her questions were encouraged and answered.  Ennis Delpozo ANP  Clinic M.D.: [facility]  

## 2012-04-22 NOTE — Progress Notes (Signed)
PER DR LAWSON WILL WRAP LEFT UPPER ARM WITH ACE WRAP AND PER DR LAWSON CLIENT ADVISED TO LEAVE ACE WRAP ON FOR 2DAYS AND CLIENT VOICED UNDERSTANDING

## 2012-04-22 NOTE — Interval H&P Note (Signed)
History and Physical Interval Note:  04/22/2012 2:33 PM  [name]  has presented today for surgery, with the diagnosis of PVD  The various methods of treatment have been discussed with the patient and family. After consideration of risks, benefits and other options for treatment, the patient has consented to  Procedure(s) (LRB): ABDOMINAL AORTAGRAM (N/A) as a surgical intervention .  The patient's history has been reviewed, patient examined, no change in status, stable for surgery.  I have reviewed the patient's chart and labs.  Questions were answered to the patient's satisfaction.     Kemo Spruce

## 2012-04-23 ENCOUNTER — Encounter: Payer: Self-pay | Admitting: Cardiology

## 2012-04-23 ENCOUNTER — Telehealth: Payer: Self-pay | Admitting: Vascular Surgery

## 2012-04-23 NOTE — Telephone Encounter (Signed)
Message copied by Fredrich Birks on Thu Apr 23, 2012  9:33 AM ------      Message from: Melene Plan      Created: Wed Apr 22, 2012  4:27 PM       Per Dr Ilean Skill we need cardiac clearance from Dr Jerral Bonito for sugery Fem-Fem bypass when she is cleared.

## 2012-04-23 NOTE — Progress Notes (Signed)
   When I see pt 05/01/2012, I need to decide if she can be cleared for vascular surgery with Dr. Arbie Cookey.

## 2012-04-23 NOTE — Telephone Encounter (Signed)
When I called [facility] to request cardiac clearance, I was told by Judeth Cornfield to just staff message Dr Myrtis Ser. She said that Ms Choung was already scheduled to see him 08/09 for post hsp, so we just needed to staff message him a request for cardiac clearance.  I sent one to Dr Myrtis Ser, I will wait to hear back from him and forward any information to [facility] and Oswaldo Done. dpm

## 2012-04-24 ENCOUNTER — Telehealth: Payer: Self-pay | Admitting: Vascular Surgery

## 2012-04-24 NOTE — Telephone Encounter (Signed)
Message copied by Fredrich Birks on Fri Apr 24, 2012 12:02 PM ------      Message from: Myrtis Ser, [place] D      Created: Thu Apr 23, 2012  7:22 PM      Regarding: RE: Requesting Cardiac Clearnace      Contact: [phone]       I will be happy to assess her then.      ----- Message -----         From: Fredrich Birks         Sent: 04/23/2012   9:24 AM           To: Luis Abed, MD      Subject: Requesting Cardiac Clearnace                             Dr Myrtis Ser,            Dr. Tawanna Cooler Early has requested cardiac clearance in preparation of fem-fem bypass on mutual patient Thelia Tanksley. She is already scheduled to see you on 05/01/12 for a post op visit. Is it possible to be evaluated at that time for clearance as well?            Thank you,            Berton Mount      Patient Clinic Representative      Vascular and Vein Specialist

## 2012-04-24 NOTE — Telephone Encounter (Signed)
Dr Myrtis Ser has agreed to see Ms Higley for clearance on 05/01/12.

## 2012-04-25 ENCOUNTER — Other Ambulatory Visit: Payer: Self-pay | Admitting: Cardiology

## 2012-04-27 NOTE — Telephone Encounter (Signed)
Fax Received. Refill Completed. [name] (R.M.A)   

## 2012-04-28 ENCOUNTER — Ambulatory Visit (INDEPENDENT_AMBULATORY_CARE_PROVIDER_SITE_OTHER): Payer: Medicare Other | Admitting: *Deleted

## 2012-04-28 DIAGNOSIS — I5022 Chronic systolic (congestive) heart failure: Secondary | ICD-10-CM

## 2012-04-28 DIAGNOSIS — Z7901 Long term (current) use of anticoagulants: Secondary | ICD-10-CM

## 2012-04-28 DIAGNOSIS — I4891 Unspecified atrial fibrillation: Secondary | ICD-10-CM

## 2012-05-01 ENCOUNTER — Ambulatory Visit (INDEPENDENT_AMBULATORY_CARE_PROVIDER_SITE_OTHER): Payer: Medicare Other | Admitting: Cardiology

## 2012-05-01 ENCOUNTER — Encounter: Payer: Self-pay | Admitting: Cardiology

## 2012-05-01 ENCOUNTER — Ambulatory Visit (INDEPENDENT_AMBULATORY_CARE_PROVIDER_SITE_OTHER): Payer: Medicare Other | Admitting: *Deleted

## 2012-05-01 VITALS — BP 145/92 | HR 73 | Ht 63.0 in | Wt 126.1 lb

## 2012-05-01 DIAGNOSIS — I059 Rheumatic mitral valve disease, unspecified: Secondary | ICD-10-CM

## 2012-05-01 DIAGNOSIS — IMO0002 Reserved for concepts with insufficient information to code with codable children: Secondary | ICD-10-CM

## 2012-05-01 DIAGNOSIS — I340 Nonrheumatic mitral (valve) insufficiency: Secondary | ICD-10-CM

## 2012-05-01 DIAGNOSIS — Z09 Encounter for follow-up examination after completed treatment for conditions other than malignant neoplasm: Secondary | ICD-10-CM

## 2012-05-01 DIAGNOSIS — I5022 Chronic systolic (congestive) heart failure: Secondary | ICD-10-CM

## 2012-05-01 DIAGNOSIS — I739 Peripheral vascular disease, unspecified: Secondary | ICD-10-CM

## 2012-05-01 DIAGNOSIS — I2510 Atherosclerotic heart disease of native coronary artery without angina pectoris: Secondary | ICD-10-CM

## 2012-05-01 DIAGNOSIS — R0989 Other specified symptoms and signs involving the circulatory and respiratory systems: Secondary | ICD-10-CM

## 2012-05-01 DIAGNOSIS — I4891 Unspecified atrial fibrillation: Secondary | ICD-10-CM

## 2012-05-01 DIAGNOSIS — R9430 Abnormal result of cardiovascular function study, unspecified: Secondary | ICD-10-CM

## 2012-05-01 DIAGNOSIS — Z7901 Long term (current) use of anticoagulants: Secondary | ICD-10-CM

## 2012-05-01 DIAGNOSIS — Z9229 Personal history of other drug therapy: Secondary | ICD-10-CM

## 2012-05-01 DIAGNOSIS — Z01810 Encounter for preprocedural cardiovascular examination: Secondary | ICD-10-CM

## 2012-05-01 MED ORDER — AMIODARONE HCL 200 MG PO TABS: 200.0000 mg | ORAL_TABLET | Freq: Every day | ORAL | Status: DC

## 2012-05-01 NOTE — Assessment & Plan Note (Signed)
The patient's ejection fraction is 20-25% by echo in January, 2013. We have very limited options in terms of medications it can be used. With dialysis for volume is controlled and she is stable.

## 2012-05-01 NOTE — Patient Instructions (Addendum)
Your physician recommends that you schedule a follow-up appointment in: 3 months with Dr. Myrtis Ser. Your physician has recommended you make the following change in your medication: decreased your amiodarone to 200 mg daily. All other medications are still the same. You have been cleared to have your vascular surgery. Dr. Arbie Cookey will receive notice of this.

## 2012-05-01 NOTE — Assessment & Plan Note (Signed)
Amiodarone was started July, 2013. The dose today will be decreased from 400 mg daily to 200 mg daily.

## 2012-05-01 NOTE — Assessment & Plan Note (Signed)
The patient has severe peripheral artery disease. She needs more vascular surgery. I feel that she can be cleared for this.

## 2012-05-01 NOTE — Assessment & Plan Note (Signed)
The patient is maintaining sinus rhythm with amiodarone. She has been on 200 mg twice daily for several weeks. Her dose will now be decreased to 200 mg daily.

## 2012-05-01 NOTE — Assessment & Plan Note (Signed)
The patient had CABG in 2007. Nuclear scan in 2010 revealed no ischemia. The patient has had multiple very demanding medical situations over time and her cardiac status has remained stable. She's not having any chest pain. No further workup at this time.

## 2012-05-01 NOTE — Assessment & Plan Note (Signed)
The patient will continue on Coumadin. It can be stopped for her vascular surgery and then restarted.

## 2012-05-01 NOTE — Assessment & Plan Note (Signed)
The patient has moderate mitral regurgitation by echo. We will follow this.

## 2012-05-01 NOTE — Progress Notes (Signed)
HPI The patient is seen today post hospitalization for atrial fibrillation. She is also seen for cardiac clearance for vascular surgery by Dr.Early. In early July, 2013, the patient presented to the hospital with atrial fibrillation. Ultimately she converted to sinus but it was felt that she should be treated with amiodarone. This was started and she has tolerated the drug. Also Coumadin was started. She's not had any chest pain and she's not had any return of her atrial fibrillation.  The patient has very significant peripheral vascular disease. She needs to have more vascular surgery. We will decide today if she can be cleared from the cardiac viewpoint. She does have significant left ventricular dysfunction. With her dialysis her volume is controlled.  As part of a recent evaluation she had an angiogram done from the left arm. Unfortunately she had a significant hematoma after that. It is improving but she still has a large area of ecchymosis in her left arm.  Allergies  Allergen Reactions  . Septra (Sulfamethoxazole W-Trimethoprim) Nausea And Vomiting  . Aspirin Other (See Comments)    unknown  . Nsaids Other (See Comments)    unknown  . Penicillins Other (See Comments)    unknown    Current Outpatient Prescriptions  Medication Sig Dispense Refill  . albuterol (PROVENTIL) (2.5 MG/3ML) 0.083% nebulizer solution Take 2.5 mg by nebulization every 6 (six) hours as needed. For shortness of breath      . ALPRAZolam (XANAX) 0.5 MG tablet Take 0.5 mg by mouth 3 (three) times daily as needed. For anxiety       . amiodarone (PACERONE) 200 MG tablet Take 200 mg by mouth 2 (two) times daily.      . cinacalcet (SENSIPAR) 30 MG tablet Take 30 mg by mouth daily.        . folic acid-vitamin b complex-vitamin c-selenium-zinc (DIALYVITE) 3 MG TABS Take 1 tablet by mouth daily.        . furosemide (LASIX) 80 MG tablet Take 80 mg by mouth 2 (two) times daily.        Marland Kitchen HYDROcodone-acetaminophen (LORTAB)  7.5-500 MG per tablet Take 1 tablet by mouth every 8 (eight) hours as needed. For pain       . insulin NPH (HUMULIN N,NOVOLIN N) 100 UNIT/ML injection Inject 40 Units into the skin 2 (two) times daily. 10 units every evening as needed      . isosorbide mononitrate (IMDUR) 30 MG 24 hr tablet Take 30 mg by mouth 2 (two) times daily.       Marland Kitchen lidocaine-prilocaine (EMLA) cream Apply 1 application topically once. Prior to dialysis on Tues, Thurs, and Sat.      . pantoprazole (PROTONIX) 20 MG tablet Take 20 mg by mouth daily.      . pravastatin (PRAVACHOL) 40 MG tablet TAKE 1 TABLET BY MOUTH EVERY EVENING  30 tablet  5  . sevelamer (RENVELA) 800 MG tablet Take 3 tablets with meals and 2 tablets with snacks      . warfarin (COUMADIN) 2 MG tablet Take 2 mg by mouth daily. Take 2 mg on Tues, Wed, and Thurs.        History   Social History  . Marital Status: Widowed    Spouse Name: RALPH    Number of Children: N/A  . Years of Education: N/A   Occupational History  . RETIRED     [employer]   Social History Main Topics  . Smoking status: Former Smoker -- 2.0  packs/day for 50 years    Types: Cigarettes    Quit date: 09/23/2005  . Smokeless tobacco: Never Used   Comment: SMOKED FROM AGE 58 UNTIL 2007  . Alcohol Use: No     NO ALCOHOL SINCE THE 70'S  . Drug Use: No  . Sexually Active: Not on file   Other Topics Concern  . Not on file   Social History Narrative  . No narrative on file    Family History  Problem Relation Age of Onset  . Diabetes Mother   . Kidney disease Father     Past Medical History  Diagnosis Date  . Mitral regurgitation     Mild-to-moderate, echo, January, 2010 / moderate, e eccentric jet, echo, March, 2012, moderate to moderately severe by bedside echocardiogram December 2012  . Ejection fraction     Ejection fraction 25-30% January, 2013  . Dyslipidemia   . CAD (coronary artery disease)     Nuclear, January, 2010, no ischemia  . Hx of  CABG     2007  . Cardiomyopathy     Ischemic status post coronary bypass grafting  . ESRD (end stage renal disease)     Dialysis, right arm shunt  / PCI tissue, interventional radiology September, 2010 / temporary catheter left subclavian, new AV fistula right arm maturing March, 201 t2  . Diabetes mellitus   . Anemia     Chronic  . PAD (peripheral artery disease)     Right axillofemoral bypass, Dr. Arbie Cookey status post redo right axillofemoral bypass August 2012  . Aspirin allergy     Takes Plavix  . Hypertension   . Carotid artery disease     Doppler, May, 2012, 50-69% R. ICA, 50-69% LICA  . Hyperlipidemia   . Arthritis   . Anxiety   . CHF (congestive heart failure) 03/23/12  . Atrial fibrillation     New onset July, 2013    Past Surgical History  Procedure Date  . Coronary artery bypass graft  07/27/2006    [name]. [name], M.D  . Abdominal hysterectomy   . Laparoscopic salpingoopherectomy   . Appendectomy   . Fem-fem bypass 2003    left to right   . Right femoral bypass 2004  . Basal cell carcinoma excision 2004    REMOVAL FROM NOSE  . Arteriovenous graft placement right  11-30-2010,  05-20-2011    ROS  Patient denies fever, chills, headache, sweats, rash, change in vision, change in hearing, chest pain, cough, nausea vomiting, urinary symptoms.All other systems are reviewed and are negative other than the history of present illness.  PHYSICAL EXAM  The patient has kyphosis of the spine. She has a significant area of ecchymosis in the left arm. Despite the multitude of medical problems she still has a very good attitude. She is oriented to person time and place. Affect is normal. There is no jugulovenous distention. Lungs reveal a few scattered rhonchi. There are no rales. Cardiac exam reveals S1 and S2. There is a soft systolic murmur. The abdomen is soft. She has no significant peripheral edema. She has no skin rashes other than the area of ecchymoses. There is no  musculoskeletal deformity other than her kyphosis. Her hemodialysis site in her right arm is stable with a Band-Aid in place.  Filed Vitals:   05/01/12 1424  BP: 145/92  Pulse: 73  Height: 5\' 3"  (1.6 m)  Weight: 126 lb 1.9 oz (57.208 kg)  SpO2: 94%   EKG is done today  and reviewed by me.There is normal sinus rhythm. The corrected QT interval is 480 ms while on 400 mg of amiodarone daily. ASSESSMENT & PLAN

## 2012-05-01 NOTE — Assessment & Plan Note (Signed)
The patient has very significant heart disease. However clinically she is stable. She has known coronary disease. Her nuclear scan in 2010 revealed no significant ischemia. I feel that it is most prudent to not do any other cardiac testing at this time. I feel the best approach is to proceed with her vascular surgery. She is certainly at increased risk. However I would recommend no further evaluation at this time. She is cleared for her vascular surgery.

## 2012-05-04 NOTE — Addendum Note (Signed)
Addended by: Eustace Moore on: 05/04/2012 10:42 AM   Modules accepted: Orders

## 2012-05-06 ENCOUNTER — Other Ambulatory Visit: Payer: Self-pay | Admitting: *Deleted

## 2012-05-07 ENCOUNTER — Ambulatory Visit (INDEPENDENT_AMBULATORY_CARE_PROVIDER_SITE_OTHER): Payer: Medicare Other | Admitting: *Deleted

## 2012-05-07 DIAGNOSIS — I4891 Unspecified atrial fibrillation: Secondary | ICD-10-CM

## 2012-05-07 DIAGNOSIS — Z7901 Long term (current) use of anticoagulants: Secondary | ICD-10-CM

## 2012-05-07 DIAGNOSIS — I5022 Chronic systolic (congestive) heart failure: Secondary | ICD-10-CM

## 2012-05-07 LAB — POCT INR: INR: 3.6

## 2012-05-11 ENCOUNTER — Encounter (HOSPITAL_COMMUNITY)
Admission: RE | Admit: 2012-05-11 | Discharge: 2012-05-11 | Disposition: A | Discharge status: Home / Self Care | Payer: Medicare Other | Source: Ambulatory Visit | Attending: Vascular Surgery | Admitting: Vascular Surgery

## 2012-05-11 ENCOUNTER — Encounter (HOSPITAL_COMMUNITY): Payer: Self-pay

## 2012-05-11 ENCOUNTER — Ambulatory Visit (HOSPITAL_COMMUNITY)
Admission: RE | Admit: 2012-05-11 | Discharge: 2012-05-11 | Disposition: A | Discharge status: Home / Self Care | Payer: Medicare Other | Source: Ambulatory Visit | Attending: Vascular Surgery | Admitting: Vascular Surgery

## 2012-05-11 DIAGNOSIS — J90 Pleural effusion, not elsewhere classified: Secondary | ICD-10-CM | POA: Insufficient documentation

## 2012-05-11 DIAGNOSIS — Z01818 Encounter for other preprocedural examination: Secondary | ICD-10-CM | POA: Insufficient documentation

## 2012-05-11 DIAGNOSIS — J8410 Pulmonary fibrosis, unspecified: Secondary | ICD-10-CM | POA: Insufficient documentation

## 2012-05-11 DIAGNOSIS — I517 Cardiomegaly: Secondary | ICD-10-CM | POA: Insufficient documentation

## 2012-05-11 DIAGNOSIS — Z01812 Encounter for preprocedural laboratory examination: Secondary | ICD-10-CM | POA: Insufficient documentation

## 2012-05-11 HISTORY — DX: Gastro-esophageal reflux disease without esophagitis: K21.9

## 2012-05-11 HISTORY — DX: Dependence on renal dialysis: Z99.2

## 2012-05-11 HISTORY — DX: End stage renal disease: N18.6

## 2012-05-11 HISTORY — DX: Nausea with vomiting, unspecified: R11.2

## 2012-05-11 HISTORY — DX: Chronic obstructive pulmonary disease, unspecified: J44.9

## 2012-05-11 HISTORY — DX: Renal failure following complete or unspecified spontaneous abortion: O03.82

## 2012-05-11 HISTORY — DX: Other specified postprocedural states: Z98.890

## 2012-05-11 LAB — COMPREHENSIVE METABOLIC PANEL
Albumin: 3.5 g/dL (ref 3.5–5.2)
Alkaline Phosphatase: 113 U/L (ref 39–117)
BUN: 36 mg/dL — ABNORMAL HIGH (ref 6–23)
CO2: 23 mEq/L (ref 19–32)
Chloride: 95 mEq/L — ABNORMAL LOW (ref 96–112)
Creatinine, Ser: 5.56 mg/dL — ABNORMAL HIGH (ref 0.50–1.10)
GFR calc non Af Amer: 7 mL/min — ABNORMAL LOW (ref >90)
Potassium: 4.6 mEq/L (ref 3.5–5.1)
Total Bilirubin: 0.2 mg/dL — ABNORMAL LOW (ref 0.3–1.2)

## 2012-05-11 LAB — URINALYSIS, ROUTINE W REFLEX MICROSCOPIC
Bilirubin Urine: NEGATIVE
Glucose, UA: 500 mg/dL — AB
Hgb urine dipstick: NEGATIVE
Protein, ur: 100 mg/dL — AB
Specific Gravity, Urine: 1.01 (ref 1.005–1.030)

## 2012-05-11 LAB — CBC
HCT: 31 % — ABNORMAL LOW (ref 36.0–46.0)
Hemoglobin: 9.1 g/dL — ABNORMAL LOW (ref 12.0–15.0)
MCV: 89.9 fL (ref 78.0–100.0)
RBC: 3.45 MIL/uL — ABNORMAL LOW (ref 3.87–5.11)
RDW: 18.4 % — ABNORMAL HIGH (ref 11.5–15.5)
WBC: 8.2 10*3/uL (ref 4.0–10.5)

## 2012-05-11 LAB — TYPE AND SCREEN
ABO/RH(D): B POS
Antibody Screen: NEGATIVE

## 2012-05-11 LAB — URINE MICROSCOPIC-ADD ON

## 2012-05-11 LAB — SURGICAL PCR SCREEN: MRSA, PCR: NEGATIVE

## 2012-05-11 LAB — APTT: aPTT: 34 seconds (ref 24–37)

## 2012-05-11 NOTE — Pre-Procedure Instructions (Signed)
20 [name]  05/11/2012  Your procedure is scheduled on:  05-15-2012   Report to [facility] at 5:30 AM.  Call this number if you have problems the morning of surgery: [phone]   Remember:   Do not eat food or drink:After Midnight. .    Take these medicines the morning of surgery with A SIP OF WATER: nebulizer as needed,xanax as needed,amiodarone(Pacerone),sensipar,pain medication as needed,Isosorbide(Imdur),omeprazole(Prilosec),   Do not wear jewelry, make-up or nail polish.  Do not wear lotions, powders, or perfumes. You may wear deodorant.  Do not shave 48 hours prior to surgery. Men may shave face and neck.  Do not bring valuables to the hospital.  Contacts, dentures or bridgework may not be worn into surgery.  Leave suitcase in the car. After surgery it may be brought to your room.  For patients admitted to the hospital, checkout time is 11:00 AM the day of discharge.      Special Instructions: CHG Shower Use Special Wash: 1/2 bottle night before surgery and 1/2 bottle morning of surgery.     Please read over the following fact sheets that you were given: Pain Booklet, Blood Transfusion Information, MRSA Information and Surgical Site Infection Prevention

## 2012-05-11 NOTE — Progress Notes (Signed)
No answer when trying to notify of PCR results. Will need to try again tomorrow.

## 2012-05-11 NOTE — Progress Notes (Signed)
Pt. On O2  24/7 spoke with Dr. Steele Sizer. He does not need to see today.Will evaluate DOS.

## 2012-05-13 ENCOUNTER — Encounter (HOSPITAL_COMMUNITY): Payer: Self-pay | Admitting: Anesthesiology

## 2012-05-13 NOTE — Consult Note (Signed)
This patient is scheduled for bypass surgery of her lower extremities secondary to peripheral vascular disease to be performed by Dr. Gretta Began on 15 May 2012. Her past medical history is extensive to include CAD S/P CABG, ESRD on dialysis, MR, CHF/cardiomyopathy with diminished ejection fraction of 25-30%, dyslipidemia, HTN, DM, chronic anemia, carotid artery disease and new onset of atrial fibrillation in July 2013 now on chronic anticoagulation with Coumadin. Pre-op lab results dated 11 May 2012 to include CMP, CBC, UA, Coags and type/screen are in EPIC and have been reviewed and show chronic abnormal values. She has been cleared for vascular surgery from a cardiac standpoint by Dr. Willa Rough on 01 May 2012.   PLAN: 1. Repeat coags DOS. 2. May proceed with surgery as scheduled.   Kelton Pillar. Everhart, PA-C

## 2012-05-14 MED ORDER — VANCOMYCIN HCL 1000 MG IV SOLR
1500.0000 mg | INTRAVENOUS | Status: DC
  Filled 2012-05-14: qty 1500

## 2012-05-15 ENCOUNTER — Encounter (HOSPITAL_COMMUNITY): Payer: Self-pay | Admitting: *Deleted

## 2012-05-15 ENCOUNTER — Encounter (HOSPITAL_COMMUNITY): Payer: Self-pay | Admitting: Anesthesiology

## 2012-05-15 ENCOUNTER — Encounter (HOSPITAL_COMMUNITY)
Admission: RE | Disposition: A | Discharge status: Home / Self Care | Payer: Self-pay | Source: Ambulatory Visit | Attending: Vascular Surgery

## 2012-05-15 ENCOUNTER — Ambulatory Visit (HOSPITAL_COMMUNITY)
Admission: RE | Admit: 2012-05-15 | Discharge: 2012-05-15 | Disposition: A | Discharge status: Home / Self Care | Payer: Medicare Other | Source: Ambulatory Visit | Attending: Vascular Surgery | Admitting: Vascular Surgery

## 2012-05-15 DIAGNOSIS — Z5309 Procedure and treatment not carried out because of other contraindication: Secondary | ICD-10-CM | POA: Insufficient documentation

## 2012-05-15 DIAGNOSIS — I743 Embolism and thrombosis of arteries of the lower extremities: Secondary | ICD-10-CM | POA: Insufficient documentation

## 2012-05-15 DIAGNOSIS — Z01812 Encounter for preprocedural laboratory examination: Secondary | ICD-10-CM | POA: Insufficient documentation

## 2012-05-15 LAB — POCT I-STAT 4, (NA,K, GLUC, HGB,HCT)
Potassium: 4.4 mEq/L (ref 3.5–5.1)
Sodium: 139 mEq/L (ref 135–145)

## 2012-05-15 LAB — APTT: aPTT: 31 seconds (ref 24–37)

## 2012-05-15 LAB — PROTIME-INR
INR: 1.26 (ref 0.00–1.49)
Prothrombin Time: 16.1 seconds — ABNORMAL HIGH (ref 11.6–15.2)

## 2012-05-15 SURGERY — CREATION, BYPASS, ARTERIAL, FEMORAL TO FEMORAL, USING GRAFT
Anesthesia: General

## 2012-05-15 MED ORDER — MUPIROCIN 2 % EX OINT
TOPICAL_OINTMENT | Freq: Once | CUTANEOUS | Status: AC
  Administered 2012-05-15: 06:00:00 via NASAL

## 2012-05-15 MED ORDER — SODIUM CHLORIDE 0.9 % IV SOLN: INTRAVENOUS | Status: DC

## 2012-05-15 MED ORDER — MUPIROCIN 2 % EX OINT
TOPICAL_OINTMENT | CUTANEOUS | Status: AC
  Filled 2012-05-15: qty 22

## 2012-05-15 MED ORDER — CIPROFLOXACIN HCL 500 MG PO TABS: 500.0000 mg | ORAL_TABLET | Freq: Two times a day (BID) | ORAL | Status: AC

## 2012-05-15 MED ORDER — CIPROFLOXACIN HCL 500 MG PO TABS: 500.0000 mg | ORAL_TABLET | Freq: Two times a day (BID) | ORAL | Status: DC

## 2012-05-15 SURGICAL SUPPLY — 36 items
APL SKNCLS STERI-STRIP NONHPOA (GAUZE/BANDAGES/DRESSINGS) ×1
BENZOIN TINCTURE PRP APPL 2/3 (GAUZE/BANDAGES/DRESSINGS) ×3 IMPLANT
CANISTER SUCTION 2500CC (MISCELLANEOUS) ×3 IMPLANT
CANNULA VESSEL W/WING WO/VALVE (CANNULA) ×3 IMPLANT
CLIP LIGATING EXTRA MED SLVR (CLIP) ×3 IMPLANT
CLIP LIGATING EXTRA SM BLUE (MISCELLANEOUS) ×3 IMPLANT
CLOTH BEACON ORANGE TIMEOUT ST (SAFETY) ×3 IMPLANT
COVER SURGICAL LIGHT HANDLE (MISCELLANEOUS) ×3 IMPLANT
DRAIN SNY 10X20 3/4 PERF (WOUND CARE) IMPLANT
DRAPE WARM FLUID 44X44 (DRAPE) ×3 IMPLANT
DRSG COVADERM 4X10 (GAUZE/BANDAGES/DRESSINGS) IMPLANT
ELECT REM PT RETURN 9FT ADLT (ELECTROSURGICAL) ×2
ELECTRODE REM PT RTRN 9FT ADLT (ELECTROSURGICAL) ×2 IMPLANT
EVACUATOR SILICONE 100CC (DRAIN) IMPLANT
GLOVE SS BIOGEL STRL SZ 7.5 (GLOVE) ×2 IMPLANT
GLOVE SUPERSENSE BIOGEL SZ 7.5 (GLOVE) ×1
GOWN STRL NON-REIN LRG LVL3 (GOWN DISPOSABLE) ×9 IMPLANT
KIT BASIN OR (CUSTOM PROCEDURE TRAY) ×3 IMPLANT
KIT ROOM TURNOVER OR (KITS) ×3 IMPLANT
NS IRRIG 1000ML POUR BTL (IV SOLUTION) ×6 IMPLANT
PACK PERIPHERAL VASCULAR (CUSTOM PROCEDURE TRAY) ×3 IMPLANT
PAD ARMBOARD 7.5X6 YLW CONV (MISCELLANEOUS) ×6 IMPLANT
STAPLER VISISTAT 35W (STAPLE) IMPLANT
STRIP CLOSURE SKIN 1/2X4 (GAUZE/BANDAGES/DRESSINGS) ×3 IMPLANT
SUT ETHILON 3 0 PS 1 (SUTURE) IMPLANT
SUT PROLENE 2 0 FS (SUTURE) ×3 IMPLANT
SUT PROLENE 5 0 C 1 24 (SUTURE) ×6 IMPLANT
SUT PROLENE 6 0 CC (SUTURE) IMPLANT
SUT VIC AB 2-0 CTX 36 (SUTURE) ×6 IMPLANT
SUT VIC AB 3-0 SH 27 (SUTURE) ×4
SUT VIC AB 3-0 SH 27X BRD (SUTURE) ×4 IMPLANT
TOWEL OR 17X24 6PK STRL BLUE (TOWEL DISPOSABLE) ×6 IMPLANT
TOWEL OR 17X26 10 PK STRL BLUE (TOWEL DISPOSABLE) ×3 IMPLANT
TRAY FOLEY CATH 14FRSI W/METER (CATHETERS) ×3 IMPLANT
UNDERPAD 30X30 INCONTINENT (UNDERPADS AND DIAPERS) ×3 IMPLANT
WATER STERILE IRR 1000ML POUR (IV SOLUTION) ×3 IMPLANT

## 2012-05-15 NOTE — Anesthesia Preprocedure Evaluation (Addendum)
Anesthesia Evaluation  Patient identified by MRN, date of birth, ID band Patient awake    Reviewed: Allergy & Precautions, H&P , NPO status , Patient's Chart, lab work & pertinent test results  History of Anesthesia Complications (+) PONV  Airway Mallampati: I TM Distance: >3 FB Neck ROM: full    Dental   Pulmonary COPDCurrent Smoker,          Cardiovascular Exercise Tolerance: Poor hypertension, + CAD, + CABG, + Peripheral Vascular Disease and +CHF + dysrhythmias Atrial Fibrillation + Valvular Problems/Murmurs MR Rhythm:irregular Rate:Normal     Neuro/Psych    GI/Hepatic GERD-  ,  Endo/Other  Type 2, Insulin Dependent  Renal/GU ESRF, Dialysis and CRFRenal disease     Musculoskeletal   Abdominal   Peds  Hematology   Anesthesia Other Findings   Reproductive/Obstetrics                          Anesthesia Physical Anesthesia Plan  ASA: IV  Anesthesia Plan: General   Post-op Pain Management:    Induction: Intravenous  Airway Management Planned: Oral ETT  Additional Equipment:   Intra-op Plan:   Post-operative Plan: Extubation in OR  Informed Consent: I have reviewed the patients History and Physical, chart, labs and discussed the procedure including the risks, benefits and alternatives for the proposed anesthesia with the patient or authorized representative who has indicated his/her understanding and acceptance.     Plan Discussed with: CRNA, Anesthesiologist and Surgeon  Anesthesia Plan Comments:         Anesthesia Quick Evaluation

## 2012-05-15 NOTE — Progress Notes (Addendum)
Dr. Arbie Cookey came to see patient, cancelled OR due to "boil" reddened and inflammed area on right side of pubic area.  RX given to patient by [facility] PA for Cipro, patient and family aware to complete all of Cipro and to call Dr. Arbie Cookey office if area worsens or does not show improvement. Daughter stated understanding.  Dr. Arbie Cookey also instructed patient to resume Coumadin. Patient and family understand to continue.  Mupirocin given to patient and told to use in each nare with qtip BID for 5 days. Patient and daughter understand instructions.

## 2012-05-15 NOTE — Progress Notes (Signed)
Dr. Arbie Cookey notified of reddened area with pimple on right pubic area. He is going to come look at it to see if okay to do surgery or not.

## 2012-05-18 ENCOUNTER — Other Ambulatory Visit: Payer: Self-pay | Admitting: *Deleted

## 2012-05-18 ENCOUNTER — Telehealth: Payer: Self-pay | Admitting: Vascular Surgery

## 2012-05-18 DIAGNOSIS — M79642 Pain in left hand: Secondary | ICD-10-CM

## 2012-05-18 NOTE — Telephone Encounter (Signed)
Patient has been scheduled and notified of appt with [facility]. All records have been faxed to Dr Budd Palmer office for referral, waiting to hear back from them, dpm

## 2012-05-18 NOTE — Telephone Encounter (Signed)
Message copied by Fredrich Birks on Mon May 18, 2012  9:48 AM ------      Message from: Melene Plan      Created: Fri May 15, 2012 12:20 PM       Ms Poitras's surgery was cancelled today. Dr Ilean Skill would like to see her 9/3 around 2PM. He would also like her referred to a hand specialist because she can't close her 2nd finger Left hand after brachial puncture  for arteriogram.      Thanks      Darel Hong

## 2012-05-22 ENCOUNTER — Encounter: Payer: Self-pay | Admitting: Vascular Surgery

## 2012-05-26 ENCOUNTER — Ambulatory Visit: Payer: Medicare Other | Admitting: Vascular Surgery

## 2012-05-26 ENCOUNTER — Telehealth: Payer: Self-pay

## 2012-05-26 DIAGNOSIS — N7689 Other specified inflammation of vagina and vulva: Secondary | ICD-10-CM

## 2012-05-26 MED ORDER — CIPROFLOXACIN HCL 500 MG PO TABS: 500.0000 mg | ORAL_TABLET | Freq: Two times a day (BID) | ORAL | Status: AC

## 2012-05-26 NOTE — Telephone Encounter (Signed)
Message copied by Phillips Odor on Tue May 26, 2012 11:44 AM ------      Message from: Marcellus Scott      Created: Tue May 26, 2012 11:08 AM      Contact: [phone]       Bairon Klemann-Mihira Macdonnell called for you wanted to give you her number.                   Appts-We need to Cancel appointment for today. She has a boil will not make it.            Thanks      Revonda Standard

## 2012-05-26 NOTE — Telephone Encounter (Signed)
Informed Dr. Arbie Cookey of pt's complaints.  Rec'd verbal order for Cipro 500 mg 1 bid, x 10 days. and return to office in 2 weeks.  Informed pt. of Dr. Bosie Helper recommendations.  Verb understanding.  Advised will order antibiotic at pharm. And to expect phone call from our office for f/u appt.

## 2012-05-26 NOTE — Telephone Encounter (Signed)
Called pt. Back due to cancellation of her office appt. Today.  Stated Dr. Arbie Cookey cancelled her surgery on 8/23 due to a boil at lower vaginal area, and started her on Cipro for 10 days.   States she finished her Cipro on Sunday, 05/24/12, and now has a new boil developing at upper right vaginal area.  States she was told not to come to appt. today if the boil wasn't resolved.  Pt states that the 1st  boil did improve, but a new one in developing.   Advised pt. to notify her PCP of the new boil.  Will inform Dr. Arbie Cookey of pt. needing to cx. appt. today.

## 2012-05-28 ENCOUNTER — Ambulatory Visit (INDEPENDENT_AMBULATORY_CARE_PROVIDER_SITE_OTHER): Payer: Medicare Other | Admitting: *Deleted

## 2012-05-28 DIAGNOSIS — Z7901 Long term (current) use of anticoagulants: Secondary | ICD-10-CM

## 2012-05-28 DIAGNOSIS — I4891 Unspecified atrial fibrillation: Secondary | ICD-10-CM

## 2012-05-28 DIAGNOSIS — I5022 Chronic systolic (congestive) heart failure: Secondary | ICD-10-CM

## 2012-06-03 ENCOUNTER — Ambulatory Visit (INDEPENDENT_AMBULATORY_CARE_PROVIDER_SITE_OTHER): Payer: Medicare Other | Admitting: *Deleted

## 2012-06-03 ENCOUNTER — Telehealth: Payer: Self-pay | Admitting: Vascular Surgery

## 2012-06-03 DIAGNOSIS — Z7901 Long term (current) use of anticoagulants: Secondary | ICD-10-CM

## 2012-06-03 DIAGNOSIS — I4891 Unspecified atrial fibrillation: Secondary | ICD-10-CM

## 2012-06-03 DIAGNOSIS — I5022 Chronic systolic (congestive) heart failure: Secondary | ICD-10-CM

## 2012-06-03 NOTE — Telephone Encounter (Signed)
Phone call ret'd to pt. Re: c/o a new boil that is developing on lower left vaginal area.  States she has had 2 boils on right side of vaginal area; one has almost dried-up, and a 2nd one at top, right vaginal area is somewhat better, but still draining a sm. amt. bloody fluid.  States she has cont'd on Cipro, and has 3 pills left.  Denies fever.  States she reported the boils to Dr. Kristian Covey.  Advised pt. Will discuss w/ Dr. Arbie Cookey in the AM, and return call to her.

## 2012-06-03 NOTE — Telephone Encounter (Signed)
I called to speak with Ms Osio about a Hand Specialty referral we had made for her per [facility]. Patient stated that she declined the appointment from Dr Teressa Senter, she wants to see [facility] before seeing another specialist.  From what I gather, we cancelled her surgery due to a boil. She was put on Cipro and has a follow up with [facility] on 06/08/12. Ms Sessions is now concerned because another boil has appeared in the vaginal again, this time on the opposite side. She does not feel that the Cipro is working and wanted to talk with a nurse or Dr Arbie Cookey about the situation.  I am forwarding this to Okey Regal, RN for triage.She can be reached at [phone]

## 2012-06-04 NOTE — Telephone Encounter (Signed)
Discussed pt's complaints with Dr. Arbie Cookey.  He recommended that pt. receive further evaluation by her medical doctor to get the problem addressed / resolved before vascular bypass can be done. Pt. notified per phone of Dr. Bosie Helper recommendation.  Verb. Understanding.

## 2012-06-08 ENCOUNTER — Encounter: Payer: Self-pay | Admitting: Vascular Surgery

## 2012-06-09 ENCOUNTER — Encounter: Payer: Self-pay | Admitting: Vascular Surgery

## 2012-06-09 ENCOUNTER — Ambulatory Visit (INDEPENDENT_AMBULATORY_CARE_PROVIDER_SITE_OTHER): Payer: Medicare Other | Admitting: Vascular Surgery

## 2012-06-09 ENCOUNTER — Encounter (HOSPITAL_COMMUNITY): Payer: Self-pay | Admitting: Pharmacy Technician

## 2012-06-09 VITALS — BP 163/64 | HR 80 | Resp 20 | Ht 63.0 in | Wt 123.0 lb

## 2012-06-09 DIAGNOSIS — I739 Peripheral vascular disease, unspecified: Secondary | ICD-10-CM

## 2012-06-09 NOTE — Progress Notes (Signed)
Patient presents today for continued discussion of her severe ischemia of her left foot. She has a severe rest pain and dependent rubor. She had been scheduled for right to left fem-fem bypass graft in 2 weeks ago and on presentation the hospital had a subcutaneous abscess in her pubic area on the right. She is now completed a course of antibiotics and is here for continued discussion.  On physical exam well-developed a Freer white frail white female in no acute distress. She did have dialysis earlier today the right upper arm AV graft. She does have a 2+ right axilla graft pulse and 2+ right femoral pulse. She has absent left femoral pulse. Her prior area subcutaneous abscess is completely healed she has no other areas of her groin infection. Heart irregular rate and rhythm Chest clear bilaterally The tissue loss on her left foot but did dependent rubor and obvious severe ischemia  Impression and plan: Severe ischemia of left foot with a threatened limb viability. The patient will undergo right femoral to left femoral bypass with inflow from her patent right axillary to femoral bypass. She understands procedure including potential graft failure graft infection. She will be admitted on 920 for surgery and have dialysis as scheduled while she is in the patient. She will stop her Coumadin as of today

## 2012-06-10 ENCOUNTER — Other Ambulatory Visit: Payer: Self-pay

## 2012-06-10 ENCOUNTER — Encounter (HOSPITAL_COMMUNITY): Payer: Self-pay | Admitting: *Deleted

## 2012-06-11 MED ORDER — SODIUM CHLORIDE 0.9 % IV SOLN
INTRAVENOUS | Status: DC
  Administered 2012-06-12: 35 mL/h via INTRAVENOUS

## 2012-06-11 MED ORDER — VANCOMYCIN HCL IN DEXTROSE 1-5 GM/200ML-% IV SOLN
1000.0000 mg | INTRAVENOUS | Status: AC
  Administered 2012-06-12: 1000 mg via INTRAVENOUS

## 2012-06-12 ENCOUNTER — Encounter (HOSPITAL_COMMUNITY): Payer: Self-pay | Admitting: Anesthesiology

## 2012-06-12 ENCOUNTER — Encounter (HOSPITAL_COMMUNITY)
Admission: RE | Disposition: A | Discharge status: Home / Self Care | Payer: Self-pay | Source: Ambulatory Visit | Attending: Vascular Surgery

## 2012-06-12 ENCOUNTER — Encounter (HOSPITAL_COMMUNITY): Payer: Self-pay | Admitting: *Deleted

## 2012-06-12 ENCOUNTER — Inpatient Hospital Stay (HOSPITAL_COMMUNITY): Payer: Medicare Other | Admitting: Anesthesiology

## 2012-06-12 ENCOUNTER — Inpatient Hospital Stay (HOSPITAL_COMMUNITY)
Admission: RE | Admit: 2012-06-12 | Discharge: 2012-06-17 | DRG: 252 | Disposition: A | Discharge status: Home / Self Care | Payer: Medicare Other | Source: Ambulatory Visit | Attending: Vascular Surgery | Admitting: Vascular Surgery

## 2012-06-12 DIAGNOSIS — I7092 Chronic total occlusion of artery of the extremities: Secondary | ICD-10-CM | POA: Diagnosis present

## 2012-06-12 DIAGNOSIS — N186 End stage renal disease: Secondary | ICD-10-CM | POA: Diagnosis present

## 2012-06-12 DIAGNOSIS — I739 Peripheral vascular disease, unspecified: Secondary | ICD-10-CM

## 2012-06-12 DIAGNOSIS — I70209 Unspecified atherosclerosis of native arteries of extremities, unspecified extremity: Principal | ICD-10-CM | POA: Diagnosis present

## 2012-06-12 DIAGNOSIS — I70229 Atherosclerosis of native arteries of extremities with rest pain, unspecified extremity: Secondary | ICD-10-CM

## 2012-06-12 DIAGNOSIS — I120 Hypertensive chronic kidney disease with stage 5 chronic kidney disease or end stage renal disease: Secondary | ICD-10-CM | POA: Diagnosis present

## 2012-06-12 DIAGNOSIS — E119 Type 2 diabetes mellitus without complications: Secondary | ICD-10-CM | POA: Diagnosis present

## 2012-06-12 DIAGNOSIS — D649 Anemia, unspecified: Secondary | ICD-10-CM | POA: Diagnosis present

## 2012-06-12 DIAGNOSIS — I2510 Atherosclerotic heart disease of native coronary artery without angina pectoris: Secondary | ICD-10-CM | POA: Diagnosis present

## 2012-06-12 DIAGNOSIS — Z951 Presence of aortocoronary bypass graft: Secondary | ICD-10-CM

## 2012-06-12 DIAGNOSIS — E785 Hyperlipidemia, unspecified: Secondary | ICD-10-CM | POA: Diagnosis present

## 2012-06-12 DIAGNOSIS — N2581 Secondary hyperparathyroidism of renal origin: Secondary | ICD-10-CM | POA: Diagnosis present

## 2012-06-12 DIAGNOSIS — Z992 Dependence on renal dialysis: Secondary | ICD-10-CM

## 2012-06-12 HISTORY — PX: FEMORAL-FEMORAL BYPASS GRAFT: SHX936

## 2012-06-12 LAB — GLUCOSE, CAPILLARY
Glucose-Capillary: 219 mg/dL — ABNORMAL HIGH (ref 70–99)
Glucose-Capillary: 166 mg/dL — ABNORMAL HIGH (ref 70–99)
Glucose-Capillary: 143 mg/dL — ABNORMAL HIGH (ref 70–99)

## 2012-06-12 LAB — CBC
HCT: 36.1 % (ref 36.0–46.0)
Hemoglobin: 10.7 g/dL — ABNORMAL LOW (ref 12.0–15.0)
MCH: 25.9 pg — ABNORMAL LOW (ref 26.0–34.0)
MCHC: 29.6 g/dL — ABNORMAL LOW (ref 30.0–36.0)
Platelets: 167 10*3/uL (ref 150–400)
RBC: 4.08 MIL/uL (ref 3.87–5.11)
RDW: 17.8 % — ABNORMAL HIGH (ref 11.5–15.5)

## 2012-06-12 LAB — COMPREHENSIVE METABOLIC PANEL
ALT: 8 U/L (ref 0–35)
AST: 12 U/L (ref 0–37)
Albumin: 3.5 g/dL (ref 3.5–5.2)
CO2: 27 mEq/L (ref 19–32)
Calcium: 9.7 mg/dL (ref 8.4–10.5)
GFR calc non Af Amer: 10 mL/min — ABNORMAL LOW (ref >90)
Sodium: 138 mEq/L (ref 135–145)
Total Protein: 6.7 g/dL (ref 6.0–8.3)

## 2012-06-12 LAB — SURGICAL PCR SCREEN
MRSA, PCR: NEGATIVE
Staphylococcus aureus: NEGATIVE

## 2012-06-12 LAB — CREATININE, SERUM: GFR calc non Af Amer: 10 mL/min — ABNORMAL LOW (ref >90)

## 2012-06-12 LAB — TYPE AND SCREEN

## 2012-06-12 LAB — APTT: aPTT: 33 seconds (ref 24–37)

## 2012-06-12 SURGERY — CREATION, BYPASS, ARTERIAL, FEMORAL TO FEMORAL, USING GRAFT
Anesthesia: General | Site: Leg Upper | Wound class: Clean

## 2012-06-12 MED ORDER — INSULIN ASPART 100 UNIT/ML ~~LOC~~ SOLN
0.0000 [IU] | Freq: Three times a day (TID) | SUBCUTANEOUS | Status: DC
  Administered 2012-06-13: 8 [IU] via SUBCUTANEOUS
  Administered 2012-06-14: 2 [IU] via SUBCUTANEOUS
  Administered 2012-06-14: 8 [IU] via SUBCUTANEOUS
  Administered 2012-06-15: 11 [IU] via SUBCUTANEOUS
  Administered 2012-06-16: 8 [IU] via SUBCUTANEOUS
  Administered 2012-06-17 (×2): 5 [IU] via SUBCUTANEOUS

## 2012-06-12 MED ORDER — DOCUSATE SODIUM 100 MG PO CAPS
100.0000 mg | ORAL_CAPSULE | Freq: Every day | ORAL | Status: DC
  Administered 2012-06-13 – 2012-06-17 (×5): 100 mg via ORAL
  Filled 2012-06-12 (×5): qty 1

## 2012-06-12 MED ORDER — ONDANSETRON HCL 4 MG/2ML IJ SOLN
INTRAMUSCULAR | Status: DC | PRN
  Administered 2012-06-12: 4 mg via INTRAVENOUS

## 2012-06-12 MED ORDER — HEPARIN SODIUM (PORCINE) 1000 UNIT/ML IJ SOLN
INTRAMUSCULAR | Status: DC | PRN
  Administered 2012-06-12: 6000 [IU] via INTRAVENOUS

## 2012-06-12 MED ORDER — SODIUM CHLORIDE 0.9 % IR SOLN
Status: DC | PRN
  Administered 2012-06-12: 14:00:00

## 2012-06-12 MED ORDER — WARFARIN - PHARMACIST DOSING INPATIENT: Freq: Every day | Status: DC

## 2012-06-12 MED ORDER — DOPAMINE-DEXTROSE 3.2-5 MG/ML-% IV SOLN: 3.0000 ug/kg/min | INTRAVENOUS | Status: DC

## 2012-06-12 MED ORDER — ENOXAPARIN SODIUM 30 MG/0.3ML ~~LOC~~ SOLN
30.0000 mg | SUBCUTANEOUS | Status: DC
  Administered 2012-06-13 – 2012-06-17 (×5): 30 mg via SUBCUTANEOUS
  Filled 2012-06-12 (×6): qty 0.3

## 2012-06-12 MED ORDER — SODIUM CHLORIDE 0.9 % IV SOLN
INTRAVENOUS | Status: DC | PRN
  Administered 2012-06-12: 14:00:00 via INTRAVENOUS

## 2012-06-12 MED ORDER — ROCURONIUM BROMIDE 100 MG/10ML IV SOLN
INTRAVENOUS | Status: DC | PRN
  Administered 2012-06-12: 50 mg via INTRAVENOUS
  Administered 2012-06-12: 10 mg via INTRAVENOUS

## 2012-06-12 MED ORDER — HYDRALAZINE HCL 20 MG/ML IJ SOLN
10.0000 mg | INTRAMUSCULAR | Status: DC | PRN
  Filled 2012-06-12: qty 0.5

## 2012-06-12 MED ORDER — WARFARIN - PHARMACIST DOSING INPATIENT
Freq: Every day | Status: DC
  Administered 2012-06-17: 17:00:00

## 2012-06-12 MED ORDER — MUPIROCIN 2 % EX OINT
TOPICAL_OINTMENT | CUTANEOUS | Status: AC
  Administered 2012-06-12: 1
  Filled 2012-06-12: qty 22

## 2012-06-12 MED ORDER — AMIODARONE HCL 200 MG PO TABS
200.0000 mg | ORAL_TABLET | Freq: Every day | ORAL | Status: DC
  Administered 2012-06-13 – 2012-06-17 (×5): 200 mg via ORAL
  Filled 2012-06-12 (×5): qty 1

## 2012-06-12 MED ORDER — SODIUM CHLORIDE 0.9 % IV SOLN: 500.0000 mL | Freq: Once | INTRAVENOUS | Status: AC | PRN

## 2012-06-12 MED ORDER — GUAIFENESIN-DM 100-10 MG/5ML PO SYRP
15.0000 mL | ORAL_SOLUTION | ORAL | Status: DC | PRN
  Filled 2012-06-12: qty 15

## 2012-06-12 MED ORDER — PHENYLEPHRINE HCL 10 MG/ML IJ SOLN
10.0000 mg | INTRAVENOUS | Status: DC | PRN
  Administered 2012-06-12: 20 ug/min via INTRAVENOUS

## 2012-06-12 MED ORDER — FUROSEMIDE 80 MG PO TABS
80.0000 mg | ORAL_TABLET | Freq: Two times a day (BID) | ORAL | Status: DC
  Administered 2012-06-12 – 2012-06-15 (×6): 80 mg via ORAL
  Filled 2012-06-12 (×8): qty 1

## 2012-06-12 MED ORDER — ONDANSETRON HCL 4 MG/2ML IJ SOLN
4.0000 mg | Freq: Four times a day (QID) | INTRAMUSCULAR | Status: DC | PRN
  Administered 2012-06-16: 4 mg via INTRAVENOUS
  Filled 2012-06-12: qty 2

## 2012-06-12 MED ORDER — INSULIN NPH (HUMAN) (ISOPHANE) 100 UNIT/ML ~~LOC~~ SUSP
10.0000 [IU] | Freq: Every day | SUBCUTANEOUS | Status: DC
  Administered 2012-06-12 – 2012-06-13 (×2): 10 [IU] via SUBCUTANEOUS
  Filled 2012-06-12: qty 10

## 2012-06-12 MED ORDER — SEVELAMER CARBONATE 800 MG PO TABS
2400.0000 mg | ORAL_TABLET | Freq: Three times a day (TID) | ORAL | Status: DC
  Administered 2012-06-13 – 2012-06-16 (×9): 2400 mg via ORAL
  Filled 2012-06-12 (×16): qty 3

## 2012-06-12 MED ORDER — MUPIROCIN 2 % EX OINT
TOPICAL_OINTMENT | Freq: Two times a day (BID) | CUTANEOUS | Status: DC
  Administered 2012-06-12 – 2012-06-17 (×10): via NASAL
  Filled 2012-06-12: qty 22

## 2012-06-12 MED ORDER — WARFARIN SODIUM 2 MG PO TABS: 2.0000 mg | ORAL_TABLET | Freq: Every day | ORAL | Status: DC

## 2012-06-12 MED ORDER — SIMVASTATIN 20 MG PO TABS
20.0000 mg | ORAL_TABLET | Freq: Every day | ORAL | Status: DC
  Administered 2012-06-12 – 2012-06-17 (×6): 20 mg via ORAL
  Filled 2012-06-12 (×6): qty 1

## 2012-06-12 MED ORDER — PHENOL 1.4 % MT LIQD: 1.0000 | OROMUCOSAL | Status: DC | PRN

## 2012-06-12 MED ORDER — PROTAMINE SULFATE 10 MG/ML IV SOLN
INTRAVENOUS | Status: DC | PRN
  Administered 2012-06-12: 50 mg via INTRAVENOUS

## 2012-06-12 MED ORDER — DIALYVITE 3000 3 MG PO TABS: 1.0000 | ORAL_TABLET | Freq: Every day | ORAL | Status: DC

## 2012-06-12 MED ORDER — SUFENTANIL CITRATE 50 MCG/ML IV SOLN
INTRAVENOUS | Status: DC | PRN
  Administered 2012-06-12: 20 ug via INTRAVENOUS

## 2012-06-12 MED ORDER — ALBUTEROL SULFATE (5 MG/ML) 0.5% IN NEBU: 2.5000 mg | INHALATION_SOLUTION | RESPIRATORY_TRACT | Status: DC | PRN

## 2012-06-12 MED ORDER — ALPRAZOLAM 0.5 MG PO TABS: 0.5000 mg | ORAL_TABLET | Freq: Three times a day (TID) | ORAL | Status: DC | PRN

## 2012-06-12 MED ORDER — GLYCOPYRROLATE 0.2 MG/ML IJ SOLN
INTRAMUSCULAR | Status: DC | PRN
  Administered 2012-06-12: .6 mg via INTRAVENOUS

## 2012-06-12 MED ORDER — INSULIN NPH (HUMAN) (ISOPHANE) 100 UNIT/ML ~~LOC~~ SUSP
40.0000 [IU] | Freq: Every day | SUBCUTANEOUS | Status: DC
  Administered 2012-06-14: 20 [IU] via SUBCUTANEOUS
  Administered 2012-06-15: 40 [IU] via SUBCUTANEOUS
  Filled 2012-06-12: qty 10

## 2012-06-12 MED ORDER — NEOSTIGMINE METHYLSULFATE 1 MG/ML IJ SOLN
INTRAMUSCULAR | Status: DC | PRN
  Administered 2012-06-12: 5 mg via INTRAVENOUS

## 2012-06-12 MED ORDER — HYDROMORPHONE HCL PF 1 MG/ML IJ SOLN: 0.2500 mg | INTRAMUSCULAR | Status: DC | PRN

## 2012-06-12 MED ORDER — PANTOPRAZOLE SODIUM 40 MG PO TBEC
40.0000 mg | DELAYED_RELEASE_TABLET | Freq: Every day | ORAL | Status: DC
  Administered 2012-06-13 – 2012-06-17 (×5): 40 mg via ORAL
  Filled 2012-06-12 (×5): qty 1

## 2012-06-12 MED ORDER — ONDANSETRON HCL 4 MG/2ML IJ SOLN
4.0000 mg | Freq: Once | INTRAMUSCULAR | Status: AC | PRN
  Administered 2012-06-12: 4 mg via INTRAVENOUS

## 2012-06-12 MED ORDER — ETOMIDATE 2 MG/ML IV SOLN
INTRAVENOUS | Status: DC | PRN
  Administered 2012-06-12: 14 mg via INTRAVENOUS

## 2012-06-12 MED ORDER — HYDROCODONE-ACETAMINOPHEN 5-325 MG PO TABS
1.0000 | ORAL_TABLET | Freq: Four times a day (QID) | ORAL | Status: DC | PRN
  Administered 2012-06-13: 2 via ORAL
  Administered 2012-06-13 (×2): 1 via ORAL
  Administered 2012-06-14: 2 via ORAL
  Administered 2012-06-14: 1 via ORAL
  Administered 2012-06-15 (×2): 2 via ORAL
  Filled 2012-06-12: qty 2
  Filled 2012-06-12: qty 1
  Filled 2012-06-12 (×4): qty 2
  Filled 2012-06-12: qty 1

## 2012-06-12 MED ORDER — ACETAMINOPHEN 650 MG RE SUPP: 325.0000 mg | RECTAL | Status: DC | PRN

## 2012-06-12 MED ORDER — SEVELAMER CARBONATE 800 MG PO TABS
1600.0000 mg | ORAL_TABLET | ORAL | Status: DC | PRN
  Filled 2012-06-12: qty 2

## 2012-06-12 MED ORDER — ACETAMINOPHEN 325 MG PO TABS: 325.0000 mg | ORAL_TABLET | ORAL | Status: DC | PRN

## 2012-06-12 MED ORDER — LIDOCAINE-PRILOCAINE 2.5-2.5 % EX CREA
1.0000 "application " | TOPICAL_CREAM | CUTANEOUS | Status: DC
  Filled 2012-06-12: qty 5

## 2012-06-12 MED ORDER — ISOSORBIDE MONONITRATE ER 30 MG PO TB24
30.0000 mg | ORAL_TABLET | Freq: Two times a day (BID) | ORAL | Status: DC
  Administered 2012-06-12 – 2012-06-17 (×9): 30 mg via ORAL
  Filled 2012-06-12 (×11): qty 1

## 2012-06-12 MED ORDER — RENA-VITE PO TABS
1.0000 | ORAL_TABLET | Freq: Every day | ORAL | Status: DC
  Administered 2012-06-12 – 2012-06-17 (×6): 1 via ORAL
  Filled 2012-06-12 (×6): qty 1

## 2012-06-12 MED ORDER — 0.9 % SODIUM CHLORIDE (POUR BTL) OPTIME
TOPICAL | Status: DC | PRN
  Administered 2012-06-12: 2000 mL

## 2012-06-12 MED ORDER — MORPHINE SULFATE 2 MG/ML IJ SOLN: 2.0000 mg | INTRAMUSCULAR | Status: DC | PRN

## 2012-06-12 MED ORDER — LABETALOL HCL 5 MG/ML IV SOLN
10.0000 mg | INTRAVENOUS | Status: DC | PRN
  Filled 2012-06-12: qty 4

## 2012-06-12 MED ORDER — PROMETHAZINE HCL 25 MG/ML IJ SOLN
INTRAMUSCULAR | Status: AC
  Administered 2012-06-12: 12.5 mg via INTRAVENOUS
  Filled 2012-06-12: qty 1

## 2012-06-12 MED ORDER — METOPROLOL TARTRATE 1 MG/ML IV SOLN
2.0000 mg | INTRAVENOUS | Status: DC | PRN
  Filled 2012-06-12: qty 5

## 2012-06-12 MED ORDER — POTASSIUM CHLORIDE CRYS ER 20 MEQ PO TBCR: 20.0000 meq | EXTENDED_RELEASE_TABLET | Freq: Once | ORAL | Status: AC | PRN

## 2012-06-12 MED ORDER — MIDAZOLAM HCL 5 MG/5ML IJ SOLN
INTRAMUSCULAR | Status: DC | PRN
  Administered 2012-06-12 (×2): .5 mg via INTRAVENOUS

## 2012-06-12 MED ORDER — PROMETHAZINE HCL 25 MG/ML IJ SOLN
12.5000 mg | Freq: Once | INTRAMUSCULAR | Status: AC
  Administered 2012-06-12: 12.5 mg via INTRAVENOUS

## 2012-06-12 MED ORDER — ONDANSETRON HCL 4 MG/2ML IJ SOLN
INTRAMUSCULAR | Status: AC
  Filled 2012-06-12: qty 2

## 2012-06-12 MED ORDER — LIDOCAINE HCL 4 % MT SOLN
OROMUCOSAL | Status: DC | PRN
  Administered 2012-06-12: 4 mL via TOPICAL

## 2012-06-12 SURGICAL SUPPLY — 49 items
APL SKNCLS STERI-STRIP NONHPOA (GAUZE/BANDAGES/DRESSINGS) ×2
BENZOIN TINCTURE PRP APPL 2/3 (GAUZE/BANDAGES/DRESSINGS) ×3 IMPLANT
CANISTER SUCTION 2500CC (MISCELLANEOUS) ×2 IMPLANT
CANNULA VESSEL W/WING WO/VALVE (CANNULA) ×2 IMPLANT
CLIP LIGATING EXTRA MED SLVR (CLIP) ×2 IMPLANT
CLIP LIGATING EXTRA SM BLUE (MISCELLANEOUS) ×2 IMPLANT
CLOTH BEACON ORANGE TIMEOUT ST (SAFETY) ×2 IMPLANT
CLSR STERI-STRIP ANTIMIC 1/2X4 (GAUZE/BANDAGES/DRESSINGS) ×2 IMPLANT
COVER SURGICAL LIGHT HANDLE (MISCELLANEOUS) ×2 IMPLANT
DRAIN SNY 10X20 3/4 PERF (WOUND CARE) IMPLANT
DRAPE WARM FLUID 44X44 (DRAPE) ×2 IMPLANT
DRSG COVADERM 4X10 (GAUZE/BANDAGES/DRESSINGS) IMPLANT
DRSG COVADERM 4X6 (GAUZE/BANDAGES/DRESSINGS) ×2 IMPLANT
ELECT REM PT RETURN 9FT ADLT (ELECTROSURGICAL) ×2
ELECTRODE REM PT RTRN 9FT ADLT (ELECTROSURGICAL) ×1 IMPLANT
EVACUATOR SILICONE 100CC (DRAIN) IMPLANT
GLOVE BIO SURGEON STRL SZ 6.5 (GLOVE) ×2 IMPLANT
GLOVE BIOGEL PI IND STRL 6.5 (GLOVE) IMPLANT
GLOVE BIOGEL PI IND STRL 7.0 (GLOVE) IMPLANT
GLOVE BIOGEL PI INDICATOR 6.5 (GLOVE) ×3
GLOVE BIOGEL PI INDICATOR 7.0 (GLOVE) ×2
GLOVE SS BIOGEL STRL SZ 7.5 (GLOVE) ×1 IMPLANT
GLOVE SUPERSENSE BIOGEL SZ 7.5 (GLOVE) ×1
GLOVE SURG SS PI 6.0 STRL IVOR (GLOVE) ×2 IMPLANT
GOWN STRL NON-REIN LRG LVL3 (GOWN DISPOSABLE) ×7 IMPLANT
GRAFT HEMASHIELD 8MM (Vascular Products) ×2 IMPLANT
GRAFT VASC STRG 30X8KNIT (Vascular Products) IMPLANT
INSERT FOGARTY SM (MISCELLANEOUS) ×1 IMPLANT
KIT BASIN OR (CUSTOM PROCEDURE TRAY) ×2 IMPLANT
KIT ROOM TURNOVER OR (KITS) ×2 IMPLANT
NS IRRIG 1000ML POUR BTL (IV SOLUTION) ×4 IMPLANT
PACK PERIPHERAL VASCULAR (CUSTOM PROCEDURE TRAY) ×2 IMPLANT
PAD ARMBOARD 7.5X6 YLW CONV (MISCELLANEOUS) ×4 IMPLANT
STAPLER VISISTAT 35W (STAPLE) IMPLANT
STRIP CLOSURE SKIN 1/2X4 (GAUZE/BANDAGES/DRESSINGS) ×2 IMPLANT
SUT ETHILON 3 0 PS 1 (SUTURE) IMPLANT
SUT PROLENE 2 0 FS (SUTURE) ×1 IMPLANT
SUT PROLENE 5 0 C 1 24 (SUTURE) ×4 IMPLANT
SUT PROLENE 6 0 CC (SUTURE) ×1 IMPLANT
SUT SILK 2 0 SH (SUTURE) ×1 IMPLANT
SUT VIC AB 2-0 CTX 36 (SUTURE) ×4 IMPLANT
SUT VIC AB 3-0 SH 27 (SUTURE) ×4
SUT VIC AB 3-0 SH 27X BRD (SUTURE) ×2 IMPLANT
SUT VICRYL 4-0 PS2 18IN ABS (SUTURE) ×1 IMPLANT
TOWEL OR 17X24 6PK STRL BLUE (TOWEL DISPOSABLE) ×4 IMPLANT
TOWEL OR 17X26 10 PK STRL BLUE (TOWEL DISPOSABLE) ×2 IMPLANT
TRAY FOLEY CATH 14FRSI W/METER (CATHETERS) ×2 IMPLANT
UNDERPAD 30X30 INCONTINENT (UNDERPADS AND DIAPERS) ×1 IMPLANT
WATER STERILE IRR 1000ML POUR (IV SOLUTION) ×2 IMPLANT

## 2012-06-12 NOTE — Anesthesia Preprocedure Evaluation (Addendum)
Anesthesia Evaluation  Patient identified by MRN, date of birth, ID band Patient awake    Reviewed: Allergy & Precautions, H&P , NPO status , Patient's Chart, lab work & pertinent test results  Airway Mallampati: II      Dental   Pulmonary COPD breath sounds clear to auscultation        Cardiovascular hypertension, Pt. on medications + CAD and +CHF + dysrhythmias Atrial Fibrillation Rhythm:Regular Rate:Normal     Neuro/Psych Anxiety    GI/Hepatic Neg liver ROS, GERD-  ,  Endo/Other  diabetes, Type 2  Renal/GU Renal disease     Musculoskeletal   Abdominal   Peds  Hematology   Anesthesia Other Findings   Reproductive/Obstetrics                          Anesthesia Physical Anesthesia Plan  ASA: III  Anesthesia Plan: General   Post-op Pain Management:    Induction: Intravenous  Airway Management Planned: Oral ETT  Additional Equipment:   Intra-op Plan:   Post-operative Plan: Possible Post-op intubation/ventilation  Informed Consent: I have reviewed the patients History and Physical, chart, labs and discussed the procedure including the risks, benefits and alternatives for the proposed anesthesia with the patient or authorized representative who has indicated his/her understanding and acceptance.   Dental advisory given  Plan Discussed with: CRNA and Anesthesiologist  Anesthesia Plan Comments:         Anesthesia Quick Evaluation

## 2012-06-12 NOTE — H&P (Signed)
ARBIE REISZ   04/16/2012 2:00 PM Office Visit  MRN: [id number]   Description: 71 year old female  Provider: Evern Bio, NP  Department: [facility]        Diagnoses     Chronic total occlusion of artery of the extremities   - Primary    440.4    Disturbance of skin sensation     782.0      Reason for Visit     PAD    Left foot decreased pulses and redness, and painful duration 2 weeks        Vitals - Last Recorded       BP Pulse Resp Ht Wt BMI    142/54 75 16 5\' 3"  (1.6 m) 124 lb 4.8 oz (56.382 kg) 22.02 kg/m2         SpO2 LMP            98% 08/06/1986               Progress Notes     WELCH,RUSSELL L, NP  04/16/2012  2:38 PM  Signed [facility] Office Note   CC: Patient called with left lower extremity pain Referring Physician: Arshan Jabs   History of Present Illness: 71 year old female patient of Dr. Arbie Cookey is who is status post a right axillary to femoral bypass that was a redo in August 2012. The patient has done well until she  visited Dr. Pricilla Holm who is a podiatrist for an ingrown toenail that told the patient she should followup here due to the fact he could not find pulses in her lower extremities. The patient states her left foot has been red and painful for about 2 weeks, she has no real complaints of right-sided pain. The patient states her left lower extremity hurts at rest as well as with exercise. She does have an open ulceration on her left shin that she states was a mosquito bite 3 weeks ago.    Past Medical History   Diagnosis  Date   .  Mitral regurgitation         Mild-to-moderate, echo, January, 2010 / moderate, e eccentric jet, echo, March, 2012, moderate to moderately severe by bedside echocardiogram December 2012   .  Ejection fraction         Ejection fraction 25-30% January, 2013   .  Dyslipidemia     .  CAD (coronary artery disease)         Nuclear, January, 2010, no ischemia   .  Hx of  CABG         2007   .  Cardiomyopathy         Ischemic status post coronary bypass grafting   .  ESRD (end stage renal disease)         Dialysis, right arm shunt  / PCI tissue, interventional radiology September, 2010 / temporary catheter left subclavian, new AV fistula right arm maturing March, 201 t2   .  Diabetes mellitus     .  Anemia         Chronic   .  PAD (peripheral artery disease)         Right axillofemoral bypass, Dr. Arbie Cookey status post redo right axillofemoral bypass August 2012   .  Aspirin allergy         Takes Plavix   .  Hypertension     .  Carotid artery disease  Doppler, May, 2012, 50-69% R. ICA, 50-69% LICA   .  Hyperlipidemia     .  Arthritis     .  Anxiety     .  Irregular heart beat     .  CHF (congestive heart failure)  03/23/12      ROS: [x]  Positive   [ ]  Denies      General: [ ]  Weight loss, [ ]  Fever, [ ]  chills Neurologic: [ ]  Dizziness, [ ]  Blackouts, [ ]  Seizure [ ]  Stroke, [ ]  "Mini stroke", [ ]  Slurred speech, [ ]  Temporary blindness; [ ]  weakness in arms or legs, [ ]  Hoarseness Cardiac: [ ]  Chest pain/pressure, [x ] Shortness of breath at rest [ x] Shortness of breath with exertion, [ x] Atrial fibrillation or irregular heartbeat Vascular: [x ] Pain in legs with walking, [ x] Pain in legs at rest, [ ]  Pain in legs at night,  [ ]  Non-healing ulcer, [ ]  Blood clot in vein/DVT,    Pulmonary: [ ]  Home oxygen, [ ]  Productive cough, [ ]  Coughing up blood, [ ]  Asthma,  [x ] Wheezing Musculoskeletal:  [ ]  Arthritis, [ ]  Low back pain, [ ]  Joint pain Hematologic: [ ]  Easy Bruising, [ ]  Anemia; [ ]  Hepatitis Gastrointestinal: [ ]  Blood in stool, [ ]  Gastroesophageal Reflux/heartburn, [ ]  Trouble swallowing Urinary: [ ]  chronic Kidney disease, [ ]  on HD - [ ]  MWF or [ ]  TTHS, [ ]  Burning with urination, [ ]  Difficulty urinating Skin: [ ]  Rashes, [ ]  Wounds Psychological: [ ]  Anxiety, [ ]  Depression     Social History History   Substance Use  Topics   .  Smoking status:  Former Smoker -- 2.0 packs/day for 50 years       Types:  Cigarettes       Quit date:  09/23/2005   .  Smokeless tobacco:  Never Used     Comment: SMOKED FROM AGE 44 UNTIL 2007   .  Alcohol Use:  No         NO ALCOHOL SINCE THE 70'S      Family History Family History   Problem  Relation  Age of Onset   .  Diabetes  Mother     .  Kidney disease  Father         Allergies   Allergen  Reactions   .  Septra (Sulfamethoxazole W-Trimethoprim)  Nausea And Vomiting   .  Aspirin  Other (See Comments)       unknown   .  Nsaids  Other (See Comments)       unknown   .  Penicillins  Other (See Comments)       unknown       Current Outpatient Prescriptions   Medication  Sig  Dispense  Refill   .  albuterol (PROVENTIL,VENTOLIN) 2 MG/5ML syrup  Take 2 mg by mouth as needed.         .  ALPRAZolam (XANAX) 0.5 MG tablet  Take 0.5 mg by mouth 3 (three) times daily as needed. For anxiety          .  amiodarone (PACERONE) 200 MG tablet  Take 200 mg by mouth 2 (two) times daily.         .  cinacalcet (SENSIPAR) 30 MG tablet  Take 30 mg by mouth daily.           .  folic acid-vitamin b complex-vitamin c-selenium-zinc (  DIALYVITE) 3 MG TABS  Take 1 tablet by mouth daily.           .  furosemide (LASIX) 80 MG tablet  Take 80 mg by mouth 2 (two) times daily.           Marland Kitchen  HYDROcodone-acetaminophen (LORTAB) 7.5-500 MG per tablet  Take 1 tablet by mouth every 8 (eight) hours as needed. For pain          .  insulin NPH (HUMULIN N,NOVOLIN N) 100 UNIT/ML injection  Inject 40 Units into the skin daily before breakfast. 10 units every evening as needed         .  isosorbide mononitrate (IMDUR) 30 MG 24 hr tablet  Take 30 mg by mouth 2 (two) times daily.          .  pantoprazole (PROTONIX) 20 MG tablet  Take 20 mg by mouth daily.         .  sevelamer (RENVELA) 800 MG tablet  Take 6 tablets with meals and 3 tablets with snacks         .  warfarin (COUMADIN) 2 MG tablet  Take 1 tablet  (2 mg total) by mouth daily.   30 tablet   3   .  carvedilol (COREG) 25 MG tablet  Take 12.5 mg by mouth 2 (two) times daily.         .  pravastatin (PRAVACHOL) 40 MG tablet  Take 1 tablet (40 mg total) by mouth every evening.   30 tablet   6      Physical Examination    Filed Vitals:     04/16/12 1357   BP:  142/54   Pulse:  75   Resp:  16      Body mass index is 22.02 kg/(m^2).   General:  WDWN in NAD Gait: Normal HEENT: WNL Eyes: Pupils equal Pulmonary: normal non-labored breathing , without Rales, rhonchi,  wheezing Cardiac: RRR, without  Murmurs, rubs or gallops; No carotid bruits Abdomen: soft, NT, no masses Skin: no rashes, ulcers noted Vascular Exam/Pulses: Dr. feels examined the patient states the right side asked him bypasses functional with palpable femoral pulses, lower extremity pulses are not palpated   Extremities without ischemic changes, no Gangrene , no cellulitis; no open wounds;  Musculoskeletal: no muscle wasting or atrophy          Neurologic: A&O X 3; Appropriate Affect ; SENSATION: normal; MOTOR FUNCTION:  moving all extremities equally. Speech is fluent/normal   Non-Invasive Vascular Imaging: ABIs today show 0.42 on the right and monophasic 0.46 on the left and monophasic with discoloration open ulceration left lower extremity, no previous ABIs to review, Dr. Darrick Penna reviewed studies and spoke with the patient.   ASSESSMENT/PLAN: The patient with increasing left lower extremity pain with very poor vascularization bilaterally in the lower extremities. Per Dr. Darrick Penna we will set her up with an arteriogram with runoff and possible intervention with Dr. Arbie Cookey for 04/22/2012. Her follow up here be pending that appointment. The patient's in agreement with this, her questions were encouraged and answered.   Lauree Chandler ANP   Clinic M.D.: [facility]      Addendum:  The patient has been re-examined and re-evaluated.  The patient's history and physical  has been reviewed and is unchanged.  She underwent outpatient arteriogram revealing occlusion of the iliac system on the left and right she does have a right Axiom bypass. She is for right to left fem-fem bypass  as scheduled  BERNELL HAYNIE is a 70 y.o. female is being admitted with left foot ischemia. All the risks, benefits and other treatment options have been discussed with the patient. The patient has consented to proceed with Procedure(s): BYPASS GRAFT FEMORAL-FEMORAL ARTERY as a surgical intervention.  Vail Basista 06/12/2012 1:50 PM [facility]

## 2012-06-12 NOTE — H&P (View-Only) (Signed)
Patient presents today for continued discussion of her severe ischemia of her left foot. She has a severe rest pain and dependent rubor. She had been scheduled for right to left fem-fem bypass graft in 2 weeks ago and on presentation the hospital had a subcutaneous abscess in her pubic area on the right. She is now completed a course of antibiotics and is here for continued discussion.  On physical exam well-developed a Freer white frail white female in no acute distress. She did have dialysis earlier today the right upper arm AV graft. She does have a 2+ right axilla graft pulse and 2+ right femoral pulse. She has absent left femoral pulse. Her prior area subcutaneous abscess is completely healed she has no other areas of her groin infection. Heart irregular rate and rhythm Chest clear bilaterally The tissue loss on her left foot but did dependent rubor and obvious severe ischemia  Impression and plan: Severe ischemia of left foot with a threatened limb viability. The patient will undergo right femoral to left femoral bypass with inflow from her patent right axillary to femoral bypass. She understands procedure including potential graft failure graft infection. She will be admitted on 920 for surgery and have dialysis as scheduled while she is in the patient. She will stop her Coumadin as of today   

## 2012-06-12 NOTE — Preoperative (Signed)
Beta Blockers   Reason not to administer Beta Blockers:Not Applicable 

## 2012-06-12 NOTE — Progress Notes (Signed)
ANTIBIOTIC CONSULT NOTE - INITIAL  Pharmacy Consult for Vancomycin Indication: Surgical prophylaxis  Allergies  Allergen Reactions  . Aspirin Anaphylaxis  . Penicillins Anaphylaxis  . Septra (Sulfamethoxazole W-Trimethoprim) Nausea And Vomiting  . Nsaids Itching    Patient Measurements: Height: 5\' 3"  (160 cm) Weight: 126 lb 15.8 oz (57.6 kg) IBW/kg (Calculated) : 52.4   Vital Signs: Temp: 98.3 F (36.8 C) (09/20 1813) Temp src: Oral (09/20 1813) BP: 103/29 mmHg (09/20 1813) Pulse Rate: 52  (09/20 1813) Intake/Output from previous day:   Intake/Output from this shift:    Labs:  [facility] 06/12/12 0842  WBC 6.8  HGB 11.3*  PLT 167  LABCREA --  CREATININE 3.98*   Estimated Creatinine Clearance: 10.9 ml/min (by C-G formula based on Cr of 3.98). No results found for this basename: VANCOTROUGH:2,VANCOPEAK:2,VANCORANDOM:2,GENTTROUGH:2,GENTPEAK:2,GENTRANDOM:2,TOBRATROUGH:2,TOBRAPEAK:2,TOBRARND:2,AMIKACINPEAK:2,AMIKACINTROU:2,AMIKACIN:2, in the last 72 hours   Microbiology: Recent Results (from the past 720 hour(s))  SURGICAL PCR SCREEN     Status: Normal   Collection Time   06/12/12  8:50 AM      Component Value Range Status Comment   MRSA, PCR NEGATIVE  NEGATIVE Final    Staphylococcus aureus NEGATIVE  NEGATIVE Final     Medical History: Past Medical History  Diagnosis Date  . Mitral regurgitation     Mild-to-moderate, echo, January, 2010 / moderate, e eccentric jet, echo, March, 2012, moderate to moderately severe by bedside echocardiogram December 2012  . Ejection fraction     Ejection fraction 25-30% January, 2013  . Dyslipidemia   . CAD (coronary artery disease)     Nuclear, January, 2010, no ischemia  . Hx of CABG     2007  . Cardiomyopathy     Ischemic status post coronary bypass grafting  . Diabetes mellitus   . Anemia     Chronic  . PAD (peripheral artery disease)     Right axillofemoral bypass, Dr. Arbie Cookey status post redo right axillofemoral bypass  August 2012  . Aspirin allergy     Takes Plavix  . Hypertension   . Carotid artery disease     Doppler, May, 2012, 50-69% R. ICA, 50-69% LICA  . Hyperlipidemia   . Arthritis   . Anxiety   . CHF (congestive heart failure) 03/23/12  . H/O amiodarone therapy     Started July, 2013  . PONV (postoperative nausea and vomiting)   . Atrial fibrillation     New onset July, 2013  . GERD (gastroesophageal reflux disease)   . ESRD (end stage renal disease) on dialysis     Dialysis, right arm shunt  / PCI tissue, interventional radiology September, 2010 / temporary catheter left subclavian, new AV fistula right arm maturing March, 201 t2  . ESRD     dialysis Tu,Thur,Sat  . COPD (chronic obstructive pulmonary disease)     uses oxygen 2 liters 24/7    Medications:  Scheduled:    . amiodarone  200 mg Oral Daily  . docusate sodium  100 mg Oral Daily  . enoxaparin (LOVENOX) injection  30 mg Subcutaneous Q24H  . folic acid-vitamin b complex-vitamin c-selenium-zinc  1 tablet Oral Daily  . furosemide  80 mg Oral BID  . insulin aspart  0-15 Units Subcutaneous TID WC  . insulin NPH  10 Units Subcutaneous QAC supper  . insulin NPH  40 Units Subcutaneous Q breakfast  . isosorbide mononitrate  30 mg Oral BID  . lidocaine-prilocaine  1 application Topical Q T,Th,Sa-HD  . mupirocin ointment   Nasal BID  .  mupirocin ointment      . ondansetron      . pantoprazole  40 mg Oral Q1200  . promethazine  12.5 mg Intravenous Once  . sevelamer  2,400 mg Oral TID WC  . simvastatin  5 mg Oral q1800  . vancomycin  1,000 mg Intravenous 60 min Pre-Op  . DISCONTD: warfarin  2 mg Oral Daily   Infusions:    . DOPamine    . DISCONTD: sodium chloride 35 mL/hr (06/12/12 1236)   PRN: sodium chloride, acetaminophen, acetaminophen, albuterol, ALPRAZolam, guaiFENesin-dextromethorphan, hydrALAZINE, HYDROcodone-acetaminophen, labetalol, metoprolol, morphine injection, ondansetron (ZOFRAN) IV, ondansetron, phenol,  potassium chloride, sevelamer, DISCONTD: 0.9 % irrigation (POUR BTL), DISCONTD: heparin 6000 unit irrigation, DISCONTD:  HYDROmorphone (DILAUDID) injection, DISCONTD:  HYDROmorphone (DILAUDID) injection  Assessment: [name] is a 71 year old female with ESRD on TTS schedule per RN s/p left leg femoral bypass to be given 48 hours of vancomycin. One gram of vancomycin was given at 1330 9/20.  Patient is afebrile and wbc wnl.   Goal of Therapy:  Surgical prophylaxis x 48 hours   Plan:  Will hold off on further vancomycin dosing because the drug is likely still in her system.  F/u if patient gets HD tomorrow and dose vancomycin post HD accordingly   Thank you,  Brett Fairy, PharmD 06/12/2012 7:38 PM

## 2012-06-12 NOTE — Anesthesia Postprocedure Evaluation (Signed)
  Anesthesia Post-op Note  Patient: [name]  Procedure(s) Performed: Procedure(s) (LRB) with comments: BYPASS GRAFT FEMORAL-FEMORAL ARTERY (N/A) - Right to left femoral to femoral bypass  Patient Location: PACU  Anesthesia Type: General  Level of Consciousness: awake  Airway and Oxygen Therapy: Patient Spontanous Breathing  Post-op Pain: mild  Post-op Assessment: Post-op Vital signs reviewed  Post-op Vital Signs: Reviewed  Complications: No apparent anesthesia complications

## 2012-06-12 NOTE — Interval H&P Note (Signed)
History and Physical Interval Note:  06/12/2012 1:27 PM  [name]  has presented today for surgery, with the diagnosis of left foot ischemia  The various methods of treatment have been discussed with the patient and family. After consideration of risks, benefits and other options for treatment, the patient has consented to  Procedure(s) (LRB) with comments: BYPASS GRAFT FEMORAL-FEMORAL ARTERY (N/A) - Right to left femoral to femoral bypass as a surgical intervention .  The patient's history has been reviewed, patient examined, no change in status, stable for surgery.  I have reviewed the patient's chart and labs.  Questions were answered to the patient's satisfaction.     [name]

## 2012-06-12 NOTE — Transfer of Care (Signed)
Immediate Anesthesia Transfer of Care Note  Patient: [name]  Procedure(s) Performed: Procedure(s) (LRB) with comments: BYPASS GRAFT FEMORAL-FEMORAL ARTERY (N/A) - Right to left femoral to femoral bypass  Patient Location: PACU  Anesthesia Type: General  Level of Consciousness: awake, sedated and patient cooperative  Airway & Oxygen Therapy: Patient Spontanous Breathing and Patient connected to face mask oxygen  Post-op Assessment: Report given to PACU RN, Post -op Vital signs reviewed and stable and Patient moving all extremities  Post vital signs: Reviewed and stable  Complications: No apparent anesthesia complications

## 2012-06-12 NOTE — Op Note (Signed)
OPERATIVE REPORT  DATE OF SURGERY: 06/12/2012  PATIENT: [name], 71 y.o. female MRN: [id number]  DOB: [birth year]  PRE-OPERATIVE DIAGNOSIS: Left leg ischemia with rest pain  POST-OPERATIVE DIAGNOSIS:  Same  PROCEDURE: Right to left fem-fem bypass with 8 mm Hemashield graft  SURGEON:  Gretta Began, M.D.  PHYSICIAN ASSISTANT: Rhyne  ANESTHESIA:  Gen.  EBL: Minimal ml  Total I/O In: 700 [I.V.:700] Out: 50 [Urine:50]  BLOOD ADMINISTERED: None  DRAINS: None  SPECIMEN: None  COUNTS CORRECT:  YES  PLAN OF CARE: PACU   PATIENT DISPOSITION:  PACU - hemodynamically stable  PROCEDURE DETAILS: Patient was taken to the operating placed supine position where the area of both groins were prepped and draped in sterile fashion. Incision was made overlying the patient's prior right axillary to femoral bypass just above the groin crease. Next separate incision was made through an old scar in the left groin carried down to isolate the common superficial femoral and profundus femoris arteries. The patient had a prior fem-fem bypass is been removed for infection. There is a vein patch over the old anastomosis. A tunnel was created from the level of the left femoral artery to the prior existing right Axiom graft. An 8 mm Hemashield graft was brought through the tunnel. The patient was given 6000 units intravenous heparin and after adequate circulation time the common superficial femoral and profundus femoris arteries were occluded. The old vein graft was opened and partially excised. The Hemashield graft was spatulated and sewn end-to-side to the old vein patch with a running 5-0 Prolene suture. After the usual flushing maneuvers were undertaken this anastomosis was completed. Clamps were placed on the graft and the common superficial femoral and profundus femoris artery branch were removed. The graft was then flushed heparinized saline and reoccluded. Next the axillofemoral graft was occluded  proximal and distal through the incision near the right groin. An ellipse of graft was removed and the new graft was cut to appropriate length and sewn end-to-side to the old graft with a running 5-0 Prolene suture. Again the usual flushing maneuvers were undertaken prior to completion of this anastomosis. Anastomosis completed and the clamps removed with excellent graft pulse and the level of the left groin. The patient did have a patent left superficial femoral and profundus femoris arteries. The patient was given 50 mg of protamine to reverse the heparin. The wounds were irrigated with saline. Hemostasis was obtained with electrocautery. The wounds were closed with 2-0 Vicryl in several layers in the subcutaneous tissue and the subcuticular closure was with 304 0 Vicryl sutures. Sterile dressing was applied the patient was taken to the recovery in stable condition. This was carried down to the level of the graft and the graft was isolated for anastomosis.   Gretta Began, M.D. 06/12/2012 4:00 PM

## 2012-06-13 ENCOUNTER — Inpatient Hospital Stay (HOSPITAL_COMMUNITY): Payer: Medicare Other

## 2012-06-13 DIAGNOSIS — Z48812 Encounter for surgical aftercare following surgery on the circulatory system: Secondary | ICD-10-CM

## 2012-06-13 LAB — BASIC METABOLIC PANEL
BUN: 48 mg/dL — ABNORMAL HIGH (ref 6–23)
Chloride: 104 mEq/L (ref 96–112)
GFR calc Af Amer: 9 mL/min — ABNORMAL LOW (ref >90)
Glucose, Bld: 111 mg/dL — ABNORMAL HIGH (ref 70–99)
Potassium: 5.7 mEq/L — ABNORMAL HIGH (ref 3.5–5.1)

## 2012-06-13 LAB — CBC
HCT: 34.2 % — ABNORMAL LOW (ref 36.0–46.0)
Hemoglobin: 10.2 g/dL — ABNORMAL LOW (ref 12.0–15.0)
RDW: 18.2 % — ABNORMAL HIGH (ref 11.5–15.5)
WBC: 6.3 10*3/uL (ref 4.0–10.5)

## 2012-06-13 LAB — GLUCOSE, CAPILLARY
Glucose-Capillary: 118 mg/dL — ABNORMAL HIGH (ref 70–99)
Glucose-Capillary: 251 mg/dL — ABNORMAL HIGH (ref 70–99)
Glucose-Capillary: 69 mg/dL — ABNORMAL LOW (ref 70–99)
Glucose-Capillary: 156 mg/dL — ABNORMAL HIGH (ref 70–99)

## 2012-06-13 LAB — HEMOGLOBIN A1C
Hgb A1c MFr Bld: 7 % — ABNORMAL HIGH (ref <5.7)
Mean Plasma Glucose: 154 mg/dL — ABNORMAL HIGH (ref <117)

## 2012-06-13 LAB — PROTIME-INR: Prothrombin Time: 18.1 seconds — ABNORMAL HIGH (ref 11.6–15.2)

## 2012-06-13 MED ORDER — DARBEPOETIN ALFA-POLYSORBATE 100 MCG/0.5ML IJ SOLN
INTRAMUSCULAR | Status: AC
  Administered 2012-06-13: 100 ug via INTRAVENOUS
  Filled 2012-06-13: qty 0.5

## 2012-06-13 MED ORDER — NEPRO/CARBSTEADY PO LIQD: 237.0000 mL | ORAL | Status: DC | PRN

## 2012-06-13 MED ORDER — SODIUM CHLORIDE 0.9 % IV SOLN: 100.0000 mL | INTRAVENOUS | Status: DC | PRN

## 2012-06-13 MED ORDER — LIDOCAINE-PRILOCAINE 2.5-2.5 % EX CREA: 1.0000 "application " | TOPICAL_CREAM | CUTANEOUS | Status: DC | PRN

## 2012-06-13 MED ORDER — VANCOMYCIN HCL 500 MG IV SOLR
500.0000 mg | INTRAVENOUS | Status: AC | PRN
  Administered 2012-06-13: 500 mg via INTRAVENOUS
  Filled 2012-06-13: qty 500

## 2012-06-13 MED ORDER — HEPARIN SODIUM (PORCINE) 1000 UNIT/ML DIALYSIS
1000.0000 [IU] | INTRAMUSCULAR | Status: DC | PRN
  Filled 2012-06-13: qty 1

## 2012-06-13 MED ORDER — WARFARIN SODIUM 2 MG PO TABS
2.0000 mg | ORAL_TABLET | Freq: Once | ORAL | Status: AC
  Administered 2012-06-13: 2 mg via ORAL
  Filled 2012-06-13 (×3): qty 1

## 2012-06-13 MED ORDER — DARBEPOETIN ALFA-POLYSORBATE 100 MCG/0.5ML IJ SOLN
100.0000 ug | INTRAMUSCULAR | Status: DC
  Administered 2012-06-13: 100 ug via INTRAVENOUS
  Filled 2012-06-13: qty 0.5

## 2012-06-13 MED ORDER — LIDOCAINE HCL (PF) 1 % IJ SOLN: 5.0000 mL | INTRAMUSCULAR | Status: DC | PRN

## 2012-06-13 MED ORDER — ALTEPLASE 2 MG IJ SOLR
2.0000 mg | Freq: Once | INTRAMUSCULAR | Status: AC | PRN
  Filled 2012-06-13: qty 2

## 2012-06-13 MED ORDER — PENTAFLUOROPROP-TETRAFLUOROETH EX AERO: 1.0000 "application " | INHALATION_SPRAY | CUTANEOUS | Status: DC | PRN

## 2012-06-13 MED ORDER — SODIUM CHLORIDE 0.9 % IV SOLN
100.0000 mL | INTRAVENOUS | Status: DC | PRN
Start: 2012-06-13 — End: 2012-06-17

## 2012-06-13 NOTE — Procedures (Signed)
Patient was seen on dialysis and the procedure was supervised.  BFR 400  Via AVF BP is  138/58.   Patient appears to be tolerating treatment well  [name] A 06/13/2012

## 2012-06-13 NOTE — Evaluation (Signed)
Physical Therapy Evaluation Patient Details Name: [name] MRN: [id number] DOB: 1940-10-26 Today's Date: 06/13/2012 Time: 4098-1191 PT Time Calculation (min): 22 min  PT Assessment / Plan / Recommendation Clinical Impression  Pt adm for rt to lt fem-fem bypass.  Pt also HD pt.  Pt very motivated and should be able to return home with son and daughter in law.    PT Assessment  Patient needs continued PT services    Follow Up Recommendations  Home health PT    Barriers to Discharge        Equipment Recommendations  None recommended by PT    Recommendations for Other Services     Frequency Min 3X/week    Precautions / Restrictions Precautions Precautions: Fall   Pertinent Vitals/Pain VSS      Mobility  Bed Mobility Bed Mobility: Supine to Sit;Sitting - Scoot to Edge of Bed Supine to Sit: 3: Mod assist;HOB elevated Sitting - Scoot to Edge of Bed: 3: Mod assist Details for Bed Mobility Assistance: Incr time Transfers Transfers: Sit to Stand;Stand to Sit Sit to Stand: 3: Mod assist;With upper extremity assist;From bed Stand to Sit: 4: Min assist;With armrests;With upper extremity assist;To chair/3-in-1 Details for Transfer Assistance: verbal cues for hand placement.  Pt with initial posterior lean with sit to stand. Ambulation/Gait Ambulation/Gait Assistance: 4: Min assist (+1 for lines) Ambulation Distance (Feet): 40 Feet Assistive device: Rolling walker Ambulation/Gait Assistance Details: Verbal cues to look up and stand more erect Gait Pattern: Step-to pattern;Decreased step length - right;Decreased step length - left;Trunk flexed Gait velocity: decr    Exercises     PT Diagnosis: Difficulty walking;Generalized weakness;Acute pain  PT Problem List: Decreased strength;Decreased activity tolerance;Decreased balance;Decreased mobility;Decreased knowledge of use of DME PT Treatment Interventions: DME instruction;Gait training;Functional mobility  training;Therapeutic activities;Therapeutic exercise;Balance training;Patient/family education   PT Goals Acute Rehab PT Goals PT Goal Formulation: With patient Time For Goal Achievement: 06/20/12 Potential to Achieve Goals: Good Pt will go Supine/Side to Sit: with modified independence PT Goal: Supine/Side to Sit - Progress: Goal set today Pt will go Sit to Supine/Side: with modified independence PT Goal: Sit to Supine/Side - Progress: Goal set today Pt will go Sit to Stand: with supervision PT Goal: Sit to Stand - Progress: Goal set today Pt will go Stand to Sit: with supervision PT Goal: Stand to Sit - Progress: Goal set today Pt will Ambulate: 51 - 150 feet;with supervision;with least restrictive assistive device PT Goal: Ambulate - Progress: Goal set today  Visit Information  Last PT Received On: 06/13/12 Assistance Needed: +2 (lines)    Subjective Data  Subjective: "I like to be independent." Patient Stated Goal: Be as independent as possible   Prior Functioning  Home Living Lives With: Son Available Help at Discharge: Family;Available PRN/intermittently Type of Home: House Home Access: Level entry Home Layout: One level Home Adaptive Equipment: Bedside commode/3-in-1;Hospital bed;Walker - rolling;Tub transfer bench Prior Function Level of Independence: Independent Able to Take Stairs?: Yes Driving: No Vocation: Retired Musician: No difficulties Dominant Hand: Right    Cognition  Overall Cognitive Status: Appears within functional limits for tasks assessed/performed Arousal/Alertness: Awake/alert Orientation Level: Appears intact for tasks assessed Behavior During Session: [facility] for tasks performed    Extremity/Trunk Assessment Right Lower Extremity Assessment RLE ROM/Strength/Tone: Deficits RLE ROM/Strength/Tone Deficits: grossly 4/5 Left Lower Extremity Assessment LLE ROM/Strength/Tone: Deficits LLE ROM/Strength/Tone Deficits: grossly 4/5    Balance Static Standing Balance Static Standing - Balance Support: Bilateral upper extremity supported (on walker)  Static Standing - Level of Assistance: 4: Min assist  End of Session PT - End of Session Equipment Utilized During Treatment: Gait belt Activity Tolerance: Patient tolerated treatment well Patient left: in chair;with call bell/phone within reach;with family/visitor present Nurse Communication: Mobility status  GP     [name] 06/13/2012, 4:22 PM  [employer] PT [phone]

## 2012-06-13 NOTE — Progress Notes (Signed)
Pt transferred to dialysis, per MD order. Report called to receiving nurse and all questions answered.

## 2012-06-13 NOTE — Progress Notes (Signed)
VASCULAR LAB PRELIMINARY  ARTERIAL  ABI completed:    RIGHT    LEFT    PRESSURE WAVEFORM  PRESSURE WAVEFORM  BRACHIAL AVF  BRACHIAL 162 Triphasic  DP 46 Monophasic DP 78 Monophasic  AT   AT    PT 42 Monophasic PT 76 Monophasic  PER   PER    GREAT TOE  NA GREAT TOE  NA    RIGHT LEFT  ABI 0.28 0.48   The right ABI=0.28, left ABI=0.48, which is indicative of severe arterial disease bilaterally.  06/13/2012 2:39 PM Gertie Fey, RDMS, RDCS

## 2012-06-13 NOTE — Consult Note (Signed)
Reason for Consult:  Provide dialysis and dialysis related needs Referring Physician: Dr. Revonda Humphrey is an 71 y.o. female with past medical history significant for hypertension, hyperlipidemia, coronary artery disease, ischemic cardiomyopathy status post CABG, diabetes mellitus as well as peripheral arterial disease. She also has end-stage renal disease and dialyzes at the [facility] unit unit on Tuesdays Thursdays and Saturdays. Patient is admitted now yesterday underwent a fem-fem bypass per Dr. early. We are asked to provide dialysis and dialysis related needs. Patient looks quite good considering that she's been through. Her left leg which was symptomatic for the procedure is now warm and not painful. Her right upper arm AV fistula is patent. She has no particular complaints this morning.   Dialyzes at [facility] unit on TTS  Primary Nephrologist Befakadu. EDW 56.5 HD Bath 2 potassium 2.5 calcium,  Heparin yes. Access right upper arm AV fistula.  Past Medical History  Diagnosis Date  . Mitral regurgitation     Mild-to-moderate, echo, January, 2010 / moderate, e eccentric jet, echo, March, 2012, moderate to moderately severe by bedside echocardiogram December 2012  . Ejection fraction     Ejection fraction 25-30% January, 2013  . Dyslipidemia   . CAD (coronary artery disease)     Nuclear, January, 2010, no ischemia  . Hx of CABG     2007  . Cardiomyopathy     Ischemic status post coronary bypass grafting  . Diabetes mellitus   . Anemia     Chronic  . PAD (peripheral artery disease)     Right axillofemoral bypass, Dr. Arbie Cookey status post redo right axillofemoral bypass August 2012  . Aspirin allergy     Takes Plavix  . Hypertension   . Carotid artery disease     Doppler, May, 2012, 50-69% R. ICA, 50-69% LICA  . Hyperlipidemia   . Arthritis   . Anxiety   . CHF (congestive heart failure) 03/23/12  . H/O amiodarone therapy     Started July, 2013  . PONV (postoperative  nausea and vomiting)   . Atrial fibrillation     New onset July, 2013  . GERD (gastroesophageal reflux disease)   . ESRD (end stage renal disease) on dialysis     Dialysis, right arm shunt  / PCI tissue, interventional radiology September, 2010 / temporary catheter left subclavian, new AV fistula right arm maturing March, 201 t2  . ESRD     dialysis Tu,Thur,Sat  . COPD (chronic obstructive pulmonary disease)     uses oxygen 2 liters 24/7    Past Surgical History  Procedure Date  . Coronary artery bypass graft  07/27/2006    [name]. [name], M.D  . Abdominal hysterectomy   . Laparoscopic salpingoopherectomy   . Appendectomy   . Fem-fem bypass 2003    left to right   . Right femoral bypass 2004  . Basal cell carcinoma excision 2004    REMOVAL FROM NOSE  . Arteriovenous graft placement right  11-30-2010,  05-20-2011    Family History  Problem Relation Age of Onset  . Diabetes Mother   . Kidney disease Father     Social History:  reports that she quit smoking about 6 years ago. Her smoking use included Cigarettes. She has a 100 pack-year smoking history. She quit smokeless tobacco use about 6 years ago. She reports that she does not drink alcohol or use illicit drugs.  Allergies:  Allergies  Allergen Reactions  . Aspirin Anaphylaxis  .  Penicillins Anaphylaxis  . Septra (Sulfamethoxazole W-Trimethoprim) Nausea And Vomiting  . Nsaids Itching    Medications: I have reviewed the patient's current medications.  Epogen 14,500 units every dialysis   Results for orders placed during the hospital encounter of 06/12/12 (from the past 48 hour(s))  PROTIME-INR     Status: Abnormal   Collection Time   06/12/12  8:42 AM      Component Value Range Comment   Prothrombin Time 19.7 (*) 11.6 - 15.2 seconds    INR 1.73 (*) 0.00 - 1.49   APTT     Status: Normal   Collection Time   06/12/12  8:42 AM      Component Value Range Comment   aPTT 33  24 - 37 seconds   CBC     Status:  Abnormal   Collection Time   06/12/12  8:42 AM      Component Value Range Comment   WBC 6.8  4.0 - 10.5 K/uL    RBC 4.37  3.87 - 5.11 MIL/uL    Hemoglobin 11.3 (*) 12.0 - 15.0 g/dL    HCT 16.1  09.6 - 04.5 %    MCV 87.6  78.0 - 100.0 fL    MCH 25.9 (*) 26.0 - 34.0 pg    MCHC 29.5 (*) 30.0 - 36.0 g/dL    RDW 40.9 (*) 81.1 - 15.5 %    Platelets 167  150 - 400 K/uL   COMPREHENSIVE METABOLIC PANEL     Status: Abnormal   Collection Time   06/12/12  8:42 AM      Component Value Range Comment   Sodium 138  135 - 145 mEq/L    Potassium 5.2 (*) 3.5 - 5.1 mEq/L    Chloride 99  96 - 112 mEq/L    CO2 27  19 - 32 mEq/L    Glucose, Bld 237 (*) 70 - 99 mg/dL    BUN 40 (*) 6 - 23 mg/dL    Creatinine, Ser 9.14 (*) 0.50 - 1.10 mg/dL    Calcium 9.7  8.4 - 78.2 mg/dL    Total Protein 6.7  6.0 - 8.3 g/dL    Albumin 3.5  3.5 - 5.2 g/dL    AST 12  0 - 37 U/L    ALT 8  0 - 35 U/L    Alkaline Phosphatase 94  39 - 117 U/L    Total Bilirubin 0.3  0.3 - 1.2 mg/dL    GFR calc non Af Amer 10 (*) >90 mL/min    GFR calc Af Amer 12 (*) >90 mL/min   SURGICAL PCR SCREEN     Status: Normal   Collection Time   06/12/12  8:50 AM      Component Value Range Comment   MRSA, PCR NEGATIVE  NEGATIVE    Staphylococcus aureus NEGATIVE  NEGATIVE   TYPE AND SCREEN     Status: Normal   Collection Time   06/12/12  9:00 AM      Component Value Range Comment   ABO/RH(D) B POS      Antibody Screen NEG      Sample Expiration 06/15/2012     GLUCOSE, CAPILLARY     Status: Abnormal   Collection Time   06/12/12  9:07 AM      Component Value Range Comment   Glucose-Capillary 219 (*) 70 - 99 mg/dL   GLUCOSE, CAPILLARY     Status: Abnormal   Collection Time   06/12/12 11:44  AM      Component Value Range Comment   Glucose-Capillary 166 (*) 70 - 99 mg/dL   GLUCOSE, CAPILLARY     Status: Abnormal   Collection Time   06/12/12 12:34 PM      Component Value Range Comment   Glucose-Capillary 161 (*) 70 - 99 mg/dL   GLUCOSE,  CAPILLARY     Status: Abnormal   Collection Time   06/12/12  4:10 PM      Component Value Range Comment   Glucose-Capillary 143 (*) 70 - 99 mg/dL    Comment 1 Notify RN     CBC     Status: Abnormal   Collection Time   06/12/12  7:01 PM      Component Value Range Comment   WBC 6.6  4.0 - 10.5 K/uL    RBC 4.08  3.87 - 5.11 MIL/uL    Hemoglobin 10.7 (*) 12.0 - 15.0 g/dL    HCT 40.9  81.1 - 91.4 %    MCV 88.5  78.0 - 100.0 fL    MCH 26.2  26.0 - 34.0 pg    MCHC 29.6 (*) 30.0 - 36.0 g/dL    RDW 78.2 (*) 95.6 - 15.5 %    Platelets PLATELET CLUMPS NOTED ON SMEAR, UNABLE TO ESTIMATE  150 - 400 K/uL   CREATININE, SERUM     Status: Abnormal   Collection Time   06/12/12  7:01 PM      Component Value Range Comment   Creatinine, Ser 4.29 (*) 0.50 - 1.10 mg/dL    GFR calc non Af Amer 10 (*) >90 mL/min    GFR calc Af Amer 11 (*) >90 mL/min   HEMOGLOBIN A1C     Status: Abnormal   Collection Time   06/12/12  7:01 PM      Component Value Range Comment   Hemoglobin A1C 7.0 (*) <5.7 %    Mean Plasma Glucose 154 (*) <117 mg/dL   GLUCOSE, CAPILLARY     Status: Abnormal   Collection Time   06/12/12 11:28 PM      Component Value Range Comment   Glucose-Capillary 208 (*) 70 - 99 mg/dL    Comment 1 Notify RN      Comment 2 Documented in Chart     CBC     Status: Abnormal   Collection Time   06/13/12  6:07 AM      Component Value Range Comment   WBC 6.3  4.0 - 10.5 K/uL    RBC 3.94  3.87 - 5.11 MIL/uL    Hemoglobin 10.2 (*) 12.0 - 15.0 g/dL    HCT 21.3 (*) 08.6 - 46.0 %    MCV 86.8  78.0 - 100.0 fL    MCH 25.9 (*) 26.0 - 34.0 pg    MCHC 29.8 (*) 30.0 - 36.0 g/dL    RDW 57.8 (*) 46.9 - 15.5 %    Platelets 151  150 - 400 K/uL   BASIC METABOLIC PANEL     Status: Abnormal   Collection Time   06/13/12  6:07 AM      Component Value Range Comment   Sodium 140  135 - 145 mEq/L    Potassium 5.7 (*) 3.5 - 5.1 mEq/L    Chloride 104  96 - 112 mEq/L    CO2 23  19 - 32 mEq/L    Glucose, Bld 111 (*) 70 -  99 mg/dL    BUN 48 (*) 6 - 23  mg/dL    Creatinine, Ser 5.40 (*) 0.50 - 1.10 mg/dL    Calcium 9.4  8.4 - 98.1 mg/dL    GFR calc non Af Amer 8 (*) >90 mL/min    GFR calc Af Amer 9 (*) >90 mL/min   PROTIME-INR     Status: Abnormal   Collection Time   06/13/12  6:07 AM      Component Value Range Comment   Prothrombin Time 18.1 (*) 11.6 - 15.2 seconds    INR 1.55 (*) 0.00 - 1.49     No results found.  ROS: Patient states that she feels well. Her left foot which was ischemic and painful prior to procedure is now warm and not painful. He denies fevers, chills, night sweats. She denies shortness of breath cough or hemoptysis. She denies lower extremity edema. She denies nausea, vomiting, diarrhea, constipation. The remainder of the review systems is negative.  Blood pressure 121/42, pulse 69, temperature 99.6 F (37.6 C), temperature source Oral, resp. rate 20, height 5\' 3"  (1.6 m), weight 57.6 kg (126 lb 15.8 oz), last menstrual period 08/06/1986, SpO2 99.00%. General: Alert white female who looks very good considering what she went through yesterday. She is oriented and without complaint. HEENT: Pupils are equal round reactive to light, extraocular motions are intact, mucous membranes are moist. Neck: There is no jugular venous stenting, no carotid bruits no lymphadenopathy. Lungs: Are mostly clear to auscultation bilaterally without wheezes. Cardiac: Regular rate and rhythm with murmur present. Abdomen: Positive bowel sounds, soft, nontender, nondistended. Extremities: She has wounds in bilateral groin. She has no lower extremity edema. Her lower extremities are slightly dark in color but warm to touch. She has a right upper arm AV fistula with good thrill and bruit.  Assessment/Plan: 71 year old white female with end-stage renal disease and peripheral arterial disease. She is status post a fem fem bypass per Dr. Arbie Cookey. We are asked to provide dialysis and dialysis related needs. 1 Peripheral  arterial disease status post bypass: Seems to have gone well. Patient is symptomatically improved. Plans per [facility] 2 ESRD: Normally TTS at [facility] unit. She will get dialysis today. The unit should come to get her shortly given her potassium of 5.7 this morning. We will continue to keep her on her schedule. I will not use heparin with dialysis today given postop status.  3 Hypertension: Well-controlled on her current regimen. She is not much over her dry weight. Minimal volume removal today. She is on Lasix and per patient report she does make urine. Therefore this will be continued as well as continuing her Foley catheter.  4. Anemia of ESRD: Hemoglobin is in the tens. We will continue with supportive ESA.  5. Metabolic Bone Disease: Patient is currently on Rena-Vite/renvela. She was not on any vitamin D. with treatments.   Thank for this consultation. We will continue to follow with you.  Kallan Merrick A 06/13/2012, 7:14 AM

## 2012-06-13 NOTE — Progress Notes (Signed)
Pt awake and alert, some groin soreness  Filed Vitals:   06/13/12 0810 06/13/12 0824 06/13/12 0829 06/13/12 0900  BP: 141/58 142/65 137/62 138/58  Pulse: 71 71 68 67  Temp: 98.6 F (37 C)     TempSrc: Oral     Resp: 17 18 16 19   Height:      Weight: 127 lb 3.3 oz (57.7 kg)     SpO2: 97%      Groin incisions clean Feet pink and warm Doppler signals in feet  Will transfer to [id number]  Fabienne Bruns, MD [facility] Office: [phone] Pager: [phone]

## 2012-06-13 NOTE — Progress Notes (Signed)
Pt currently still in dialysis, order received to transfer pt to [id number]. Report called to receiving nurse and all questions answered.

## 2012-06-13 NOTE — Progress Notes (Signed)
ANTICOAGULATION CONSULT NOTE - Initial Consult  Pharmacy Consult for Coumadin Indication: h/o Afib  Allergies  Allergen Reactions  . Aspirin Anaphylaxis  . Penicillins Anaphylaxis  . Septra (Sulfamethoxazole W-Trimethoprim) Nausea And Vomiting  . Nsaids Itching    Patient Measurements: Height: 5\' 3"  (160 cm) Weight: 126 lb 15.8 oz (57.6 kg) IBW/kg (Calculated) : 52.4   Vital Signs: Temp: 100 F (37.8 C) (09/21 0002) Temp src: Oral (09/21 0002) BP: 126/35 mmHg (09/21 0002) Pulse Rate: 70  (09/21 0002)  Labs:  Basename 06/12/12 1901 06/12/12 0842  HGB 10.7* 11.3*  HCT 36.1 38.3  PLT PLATELET CLUMPS NOTED ON SMEAR, UNABLE TO ESTIMATE 167  APTT -- 33  LABPROT -- 19.7*  INR -- 1.73*  HEPARINUNFRC -- --  CREATININE 4.29* 3.98*  CKTOTAL -- --  CKMB -- --  TROPONINI -- --   INR 1.73 (9/20 0842)  Estimated Creatinine Clearance: 10.1 ml/min (by C-G formula based on Cr of 4.29).   Medical History: Past Medical History  Diagnosis Date  . Mitral regurgitation     Mild-to-moderate, echo, January, 2010 / moderate, e eccentric jet, echo, March, 2012, moderate to moderately severe by bedside echocardiogram December 2012  . Ejection fraction     Ejection fraction 25-30% January, 2013  . Dyslipidemia   . CAD (coronary artery disease)     Nuclear, January, 2010, no ischemia  . Hx of CABG     2007  . Cardiomyopathy     Ischemic status post coronary bypass grafting  . Diabetes mellitus   . Anemia     Chronic  . PAD (peripheral artery disease)     Right axillofemoral bypass, Dr. Arbie Cookey status post redo right axillofemoral bypass August 2012  . Aspirin allergy     Takes Plavix  . Hypertension   . Carotid artery disease     Doppler, May, 2012, 50-69% R. ICA, 50-69% LICA  . Hyperlipidemia   . Arthritis   . Anxiety   . CHF (congestive heart failure) 03/23/12  . H/O amiodarone therapy     Started July, 2013  . PONV (postoperative nausea and vomiting)   . Atrial  fibrillation     New onset July, 2013  . GERD (gastroesophageal reflux disease)   . ESRD (end stage renal disease) on dialysis     Dialysis, right arm shunt  / PCI tissue, interventional radiology September, 2010 / temporary catheter left subclavian, new AV fistula right arm maturing March, 201 t2  . ESRD     dialysis Tu,Thur,Sat  . COPD (chronic obstructive pulmonary disease)     uses oxygen 2 liters 24/7    Medications:  Prescriptions prior to admission  Medication Sig Dispense Refill  . albuterol (PROVENTIL) (2.5 MG/3ML) 0.083% nebulizer solution Take 2.5 mg by nebulization every 6 (six) hours as needed. For shortness of breath      . ALPRAZolam (XANAX) 0.5 MG tablet Take 0.5 mg by mouth 3 (three) times daily as needed. For anxiety       . amiodarone (PACERONE) 200 MG tablet Take 1 tablet (200 mg total) by mouth daily.  30 tablet  6  . doxycycline (VIBRAMYCIN) 100 MG capsule Take 100 mg by mouth 2 (two) times daily.      . folic acid-vitamin b complex-vitamin c-selenium-zinc (DIALYVITE) 3 MG TABS Take 1 tablet by mouth daily.        . furosemide (LASIX) 80 MG tablet Take 80 mg by mouth 2 (two) times daily.        Marland Kitchen  HYDROcodone-acetaminophen (LORTAB) 7.5-500 MG per tablet Take 1 tablet by mouth every 8 (eight) hours as needed. For pain       . insulin NPH (HUMULIN N,NOVOLIN N) 100 UNIT/ML injection Inject 10-40 Units into the skin 2 (two) times daily. 40 units is the morning and 10 units every evening      . isosorbide mononitrate (IMDUR) 30 MG 24 hr tablet Take 30 mg by mouth 2 (two) times daily.       Marland Kitchen lidocaine-prilocaine (EMLA) cream Apply 1 application topically once. Prior to dialysis on Tues, Thurs, and Sat.      Marland Kitchen omeprazole (PRILOSEC) 20 MG capsule Take 20 mg by mouth Daily.      . pravastatin (PRAVACHOL) 40 MG tablet Take 40 mg by mouth daily.      . sevelamer (RENVELA) 800 MG tablet Take 1,600-2,400 mg by mouth See admin instructions. Take 3 tablets with meals and 2 tablets  with snacks      . warfarin (COUMADIN) 2 MG tablet Take 2 mg by mouth daily.         Assessment: 71 yo female admitted for fem-fem bypass, now to resume anticoagulation post-op.  Goal of Therapy:  INR 2-3 Monitor platelets by anticoagulation protocol: Yes   Plan:  F/U daily INR  Tyren Dugar, Gary Fleet 06/13/2012,1:20 AM

## 2012-06-13 NOTE — Progress Notes (Signed)
ANTIBIOTIC and ANTICOAGULATION CONSULT NOTE -F/U   Pharmacy Consult for Vancomycin and Coumadin  Indication: Surgical prophylaxis and Afib  Allergies  Allergen Reactions  . Aspirin Anaphylaxis  . Penicillins Anaphylaxis  . Septra (Sulfamethoxazole W-Trimethoprim) Nausea And Vomiting  . Nsaids Itching    Patient Measurements: Height: 5\' 3"  (160 cm) Weight: 127 lb 3.3 oz (57.7 kg) (bedscales) IBW/kg (Calculated) : 52.4   Vital Signs: Temp: 98.6 F (37 C) (09/21 0810) Temp src: Oral (09/21 0810) BP: 131/49 mmHg (09/21 0930) Pulse Rate: 64  (09/21 0930) Intake/Output from previous day: 09/20 0701 - 09/21 0700 In: 700 [I.V.:700] Out: 300 [Urine:300] Intake/Output from this shift:    Labs:  Basename 06/13/12 0607 06/12/12 1901 06/12/12 0842  WBC 6.3 6.6 6.8  HGB 10.2* 10.7* 11.3*  PLT 151 PLATELET CLUMPS NOTED ON SMEAR, UNABLE TO ESTIMATE 167  LABCREA -- -- --  CREATININE 5.14* 4.29* 3.98*   Estimated Creatinine Clearance: 8.4 ml/min (by C-G formula based on Cr of 5.14). No results found for this basename: VANCOTROUGH:2,VANCOPEAK:2,VANCORANDOM:2,GENTTROUGH:2,GENTPEAK:2,GENTRANDOM:2,TOBRATROUGH:2,TOBRAPEAK:2,TOBRARND:2,AMIKACINPEAK:2,AMIKACINTROU:2,AMIKACIN:2, in the last 72 hours   Microbiology: Recent Results (from the past 720 hour(s))  SURGICAL PCR SCREEN     Status: Normal   Collection Time   06/12/12  8:50 AM      Component Value Range Status Comment   MRSA, PCR NEGATIVE  NEGATIVE Final    Staphylococcus aureus NEGATIVE  NEGATIVE Final     Medical History: Past Medical History  Diagnosis Date  . Mitral regurgitation     Mild-to-moderate, echo, January, 2010 / moderate, e eccentric jet, echo, March, 2012, moderate to moderately severe by bedside echocardiogram December 2012  . Ejection fraction     Ejection fraction 25-30% January, 2013  . Dyslipidemia   . CAD (coronary artery disease)     Nuclear, January, 2010, no ischemia  . Hx of CABG     2007  .  Cardiomyopathy     Ischemic status post coronary bypass grafting  . Diabetes mellitus   . Anemia     Chronic  . PAD (peripheral artery disease)     Right axillofemoral bypass, Dr. Arbie Cookey status post redo right axillofemoral bypass August 2012  . Aspirin allergy     Takes Plavix  . Hypertension   . Carotid artery disease     Doppler, May, 2012, 50-69% R. ICA, 50-69% LICA  . Hyperlipidemia   . Arthritis   . Anxiety   . CHF (congestive heart failure) 03/23/12  . H/O amiodarone therapy     Started July, 2013  . PONV (postoperative nausea and vomiting)   . Atrial fibrillation     New onset July, 2013  . GERD (gastroesophageal reflux disease)   . ESRD (end stage renal disease) on dialysis     Dialysis, right arm shunt  / PCI tissue, interventional radiology September, 2010 / temporary catheter left subclavian, new AV fistula right arm maturing March, 201 t2  . ESRD     dialysis Tu,Thur,Sat  . COPD (chronic obstructive pulmonary disease)     uses oxygen 2 liters 24/7    Medications:  Scheduled:     . amiodarone  200 mg Oral Daily  . darbepoetin (ARANESP) injection - DIALYSIS  100 mcg Intravenous Q Sat-HD  . docusate sodium  100 mg Oral Daily  . enoxaparin (LOVENOX) injection  30 mg Subcutaneous Q24H  . furosemide  80 mg Oral BID  . insulin aspart  0-15 Units Subcutaneous TID WC  . insulin NPH  10  Units Subcutaneous QAC supper  . insulin NPH  40 Units Subcutaneous Q breakfast  . isosorbide mononitrate  30 mg Oral BID  . lidocaine-prilocaine  1 application Topical Q T,Th,Sa-HD  . multivitamin  1 tablet Oral Daily  . mupirocin ointment   Nasal BID  . ondansetron      . pantoprazole  40 mg Oral Q1200  . promethazine  12.5 mg Intravenous Once  . sevelamer  2,400 mg Oral TID WC  . simvastatin  20 mg Oral q1800  . vancomycin  1,000 mg Intravenous 60 min Pre-Op  . Warfarin - Pharmacist Dosing Inpatient   Does not apply q1800  . DISCONTD: folic acid-vitamin b complex-vitamin  c-selenium-zinc  1 tablet Oral Daily  . DISCONTD: warfarin  2 mg Oral Daily  . DISCONTD: Warfarin - Pharmacist Dosing Inpatient   Does not apply q1800    Assessment: ABX- Ms. Garretson is a 71 year old female with ESRD on TTS schedule per RN s/p left leg femoral bypass to be given 48 hours of vancomycin. One gram of vancomycin was given at 1330 9/20.  Patient is afebrile and wbc wnl.   AC: Pt with history of afib. Warfarin PTA at 2mg  daily (last dose 9/16) also on amiodarone. Warfarin to start back up 9/21 per consult message. INR 9/21 1.55<1.73. H/h/plts stable.   Goal of Therapy:  Surgical prophylaxis x 48 hours  INR 2.0-3.0  Plan:  - Vancomycin 500mg  IV scheduled to be given with HD today x 1 - Coumadin 2mg  po x 1 tonight - INR in am  - Monitor for signs or symptoms of bleeding   Thank you,  Franchot Erichsen, Pharm.D. Clinical Pharmacist   Pager: [phone] 06/13/2012 9:52 AM

## 2012-06-14 LAB — GLUCOSE, CAPILLARY
Glucose-Capillary: 66 mg/dL — ABNORMAL LOW (ref 70–99)
Glucose-Capillary: 212 mg/dL — ABNORMAL HIGH (ref 70–99)

## 2012-06-14 LAB — PROTIME-INR: INR: 1.52 — ABNORMAL HIGH (ref 0.00–1.49)

## 2012-06-14 MED ORDER — WARFARIN SODIUM 3 MG PO TABS
3.0000 mg | ORAL_TABLET | Freq: Once | ORAL | Status: AC
  Administered 2012-06-14: 3 mg via ORAL
  Filled 2012-06-14: qty 1

## 2012-06-14 NOTE — Progress Notes (Signed)
Subjective:  No complaints today per patient.  She continues to look well.  No issues with dialysis yesterday, tolerated 2000 volume removal Objective Vital signs in last 24 hours: Filed Vitals:   06/13/12 1917 06/13/12 2022 06/13/12 2137 06/14/12 0545  BP: 98/44 96/55 114/54 131/50  Pulse:  74 77 68  Temp:  99.8 F (37.7 C)  98.8 F (37.1 C)  TempSrc:  Oral  Oral  Resp:  18  18  Height:      Weight:      SpO2:  96%  97%   Weight change: 2.3 kg (5 lb 1.1 oz)  Intake/Output Summary (Last 24 hours) at 06/14/12 0810 Last data filed at 06/14/12 0005  Gross per 24 hour  Intake    480 ml  Output   2000 ml  Net  -1520 ml   Labs: Basic Metabolic Panel:  Lab 06/13/12 1610 06/12/12 1901 06/12/12 0842  NA 140 -- 138  K 5.7* -- 5.2*  CL 104 -- 99  CO2 23 -- 27  GLUCOSE 111* -- 237*  BUN 48* -- 40*  CREATININE 5.14* 4.29* 3.98*  CALCIUM 9.4 -- 9.7  ALB -- -- --  PHOS -- -- --   Liver Function Tests:  Lab 06/12/12 0842  AST 12  ALT 8  ALKPHOS 94  BILITOT 0.3  PROT 6.7  ALBUMIN 3.5   No results found for this basename: LIPASE:3,AMYLASE:3 in the last 168 hours No results found for this basename: AMMONIA:3 in the last 168 hours CBC:  Lab 06/13/12 0607 06/12/12 1901 06/12/12 0842  WBC 6.3 6.6 6.8  NEUTROABS -- -- --  HGB 10.2* 10.7* 11.3*  HCT 34.2* 36.1 38.3  MCV 86.8 88.5 87.6  PLT 151 PLATELET CLUMPS NOTED ON SMEAR, UNABLE TO ESTIMATE 167   Cardiac Enzymes: No results found for this basename: CKTOTAL:5,CKMB:5,CKMBINDEX:5,TROPONINI:5 in the last 168 hours CBG:  Lab 06/13/12 2306 06/13/12 2158 06/13/12 2121 06/13/12 1713 06/13/12 1243  GLUCAP 156* 73 69* 251* 118*    Iron Studies: No results found for this basename: IRON,TIBC,TRANSFERRIN,FERRITIN in the last 72 hours Studies/Results: No results found. Medications: Infusions:    . DISCONTD: DOPamine      Scheduled Medications:    . amiodarone  200 mg Oral Daily  . darbepoetin (ARANESP) injection -  DIALYSIS  100 mcg Intravenous Q Sat-HD  . docusate sodium  100 mg Oral Daily  . enoxaparin (LOVENOX) injection  30 mg Subcutaneous Q24H  . furosemide  80 mg Oral BID  . insulin aspart  0-15 Units Subcutaneous TID WC  . insulin NPH  10 Units Subcutaneous QAC supper  . insulin NPH  40 Units Subcutaneous Q breakfast  . isosorbide mononitrate  30 mg Oral BID  . lidocaine-prilocaine  1 application Topical Q T,Th,Sa-HD  . multivitamin  1 tablet Oral Daily  . mupirocin ointment   Nasal BID  . pantoprazole  40 mg Oral Q1200  . sevelamer  2,400 mg Oral TID WC  . simvastatin  20 mg Oral q1800  . warfarin  2 mg Oral ONCE-1800  . Warfarin - Pharmacist Dosing Inpatient   Does not apply q1800    have reviewed scheduled and prn medications.  Physical Exam: General:NAD, looks well eating breakfast Heart: RRR Lungs: mostly clear Abdomen: soft, non tender Extremities: no edema, warm.  Bandages in both groins.  She has fullness to left arm in dependent area, think is just swelling maybe related to IV or blood draws.  No signs of  infection Dialysis Access: Right upper AVF, good thrill and bruit   I Assessment/ Plan: Pt is [name] 71 y.o. yo female with ESRD who was admitted on 06/12/2012 with  PAD. POD #2 fem-fem bypass per Dr. Arbie Cookey  Assessment/Plan: 1. PAD- POD #2 s/p fem/fem bypass per [facility].  Plan per them.  She seems to be doing quite well.   2. ESRD - continue TTS via AVF- regular unit is [facility].  Plan for next HD on 9/24 3. Anemia- last hgb 10.2, continue aranesp 4. Secondary hyperparathyroidism- continue renavite/renvela at home doses.  no vitamin D given at OP unit 5. HTN/volume- well controlled.  Is on lasix because has good UOP 6. Dispo- As above patient seems to be doing very well.  My go home before next HD is due ? [name]   06/14/2012,8:10 AM  LOS: 2 days

## 2012-06-14 NOTE — Progress Notes (Signed)
Less sore, complains of pain left forearm IV site  Filed Vitals:   06/13/12 1917 06/13/12 2022 06/13/12 2137 06/14/12 0545  BP: 98/44 96/55 114/54 131/50  Pulse:  74 77 68  Temp:  99.8 F (37.7 C)  98.8 F (37.1 C)  TempSrc:  Oral  Oral  Resp:  18  18  Height:      Weight:      SpO2:  96%  97%    Groin incisions clean and healing + graft pulse Feet pink warm bilat  A Doing well post fem fem  Plan Continue to ambulate D.c Iv left arm

## 2012-06-14 NOTE — Progress Notes (Signed)
ANTICOAGULATION CONSULT NOTE -follow up Pharmacy Consult for Coumadin Indication: h/o Afib  Allergies  Allergen Reactions  . Aspirin Anaphylaxis  . Penicillins Anaphylaxis  . Septra (Sulfamethoxazole W-Trimethoprim) Nausea And Vomiting  . Nsaids Itching    Patient Measurements: Height: 5\' 3"  (160 cm) Weight: 123 lb 3.8 oz (55.9 kg) IBW/kg (Calculated) : 52.4   Vital Signs: Temp: 98.5 F (36.9 C) (09/22 1114) Temp src: Oral (09/22 1114) BP: 136/37 mmHg (09/22 1114) Pulse Rate: 72  (09/22 1114)  Labs:  Basename 06/14/12 0630 06/13/12 0607 06/12/12 1901 06/12/12 0842  HGB -- 10.2* 10.7* --  HCT -- 34.2* 36.1 38.3  PLT -- 151 PLATELET CLUMPS NOTED ON SMEAR, UNABLE TO ESTIMATE 167  APTT -- -- -- 33  LABPROT 17.9* 18.1* -- 19.7*  INR 1.52* 1.55* -- 1.73*  HEPARINUNFRC -- -- -- --  CREATININE -- 5.14* 4.29* 3.98*  CKTOTAL -- -- -- --  CKMB -- -- -- --  TROPONINI -- -- -- --   INR 1.73 (9/20 0842)  Estimated Creatinine Clearance: 8.4 ml/min (by C-G formula based on Cr of 5.14).    Assessment: 71 yo female w/ ESRD  POD #2 s/p fem-fem bypass on coumadin for afib, onset July 2013.  Her home coumadin dose is 2 mg po daily and her last dose PTA was 9/16. It was resumed 9/21.  She does c/o of having more bruising since she started on coumadin in July than she had on plavix.  Her INR today is 1.52 after 1 dose of 2 mg. She is also on LMWH 30 sq q24.  She in on amiodarone 200 mg po daily.  Goal of Therapy:  INR 2-3   Plan:  1. Coumadin 3 mg po x 1 dose today 2. lmwh 30 q24 per MD 3. Daily INR Herby Abraham, Pharm.D. [phone] 06/14/2012 11:41 AM

## 2012-06-15 LAB — PROTIME-INR: INR: 1.42 (ref 0.00–1.49)

## 2012-06-15 LAB — GLUCOSE, CAPILLARY
Glucose-Capillary: 308 mg/dL — ABNORMAL HIGH (ref 70–99)
Glucose-Capillary: 78 mg/dL (ref 70–99)
Glucose-Capillary: 95 mg/dL (ref 70–99)
Glucose-Capillary: 73 mg/dL (ref 70–99)

## 2012-06-15 MED ORDER — LIDOCAINE HCL (PF) 1 % IJ SOLN: 5.0000 mL | INTRAMUSCULAR | Status: DC | PRN

## 2012-06-15 MED ORDER — DARBEPOETIN ALFA-POLYSORBATE 60 MCG/0.3ML IJ SOLN
60.0000 ug | INTRAMUSCULAR | Status: DC
  Administered 2012-06-16: 60 ug via INTRAVENOUS
  Filled 2012-06-15: qty 0.3

## 2012-06-15 MED ORDER — HEPARIN SODIUM (PORCINE) 1000 UNIT/ML DIALYSIS
1000.0000 [IU] | INTRAMUSCULAR | Status: DC | PRN
  Filled 2012-06-15: qty 1

## 2012-06-15 MED ORDER — HEPARIN SODIUM (PORCINE) 1000 UNIT/ML DIALYSIS
100.0000 [IU]/kg | INTRAMUSCULAR | Status: DC | PRN
  Administered 2012-06-16: 5600 [IU] via INTRAVENOUS_CENTRAL
  Filled 2012-06-15: qty 6

## 2012-06-15 MED ORDER — WARFARIN SODIUM 3 MG PO TABS
3.0000 mg | ORAL_TABLET | Freq: Once | ORAL | Status: AC
  Administered 2012-06-15: 3 mg via ORAL
  Filled 2012-06-15: qty 1

## 2012-06-15 MED ORDER — LIDOCAINE-PRILOCAINE 2.5-2.5 % EX CREA: 1.0000 "application " | TOPICAL_CREAM | CUTANEOUS | Status: DC | PRN

## 2012-06-15 MED ORDER — SODIUM CHLORIDE 0.9 % IV SOLN: 100.0000 mL | INTRAVENOUS | Status: DC | PRN

## 2012-06-15 MED ORDER — ALTEPLASE 2 MG IJ SOLR
2.0000 mg | Freq: Once | INTRAMUSCULAR | Status: AC | PRN
  Filled 2012-06-15: qty 2

## 2012-06-15 MED ORDER — PENTAFLUOROPROP-TETRAFLUOROETH EX AERO: 1.0000 "application " | INHALATION_SPRAY | CUTANEOUS | Status: DC | PRN

## 2012-06-15 MED ORDER — NEPRO/CARBSTEADY PO LIQD: 237.0000 mL | ORAL | Status: DC | PRN

## 2012-06-15 MED ORDER — CALCIUM CARBONATE ANTACID 500 MG PO CHEW
400.0000 mg | CHEWABLE_TABLET | ORAL | Status: DC | PRN
  Filled 2012-06-15: qty 2

## 2012-06-15 NOTE — Progress Notes (Signed)
Subjective: Interval History: none.. CO soreness bilat grion incisions  Objective: Vital signs in last 24 hours: Temp:  [97.5 F (36.4 C)-99.4 F (37.4 C)] 97.5 F (36.4 C) (09/23 0559) Pulse Rate:  [60-74] 60  (09/23 0559) Resp:  [18-20] 19  (09/23 0559) BP: (130-153)/(37-62) 130/51 mmHg (09/23 0559) SpO2:  [98 %-100 %] 98 % (09/23 0559)  Intake/Output from previous day: 09/22 0701 - 09/23 0700 In: 1200 [P.O.:1200] Out: 300 [Urine:300] Intake/Output this shift:    Palp fem-fem and left popliteal pulse.  Incisions healing  Lab Results:  Basename 06/13/12 0607 06/12/12 1901  WBC 6.3 6.6  HGB 10.2* 10.7*  HCT 34.2* 36.1  PLT 151 PLATELET CLUMPS NOTED ON SMEAR, UNABLE TO ESTIMATE   BMET  Basename 06/13/12 0607 06/12/12 1901 06/12/12 0842  NA 140 -- 138  K 5.7* -- 5.2*  CL 104 -- 99  CO2 23 -- 27  GLUCOSE 111* -- 237*  BUN 48* -- 40*  CREATININE 5.14* 4.29* --  CALCIUM 9.4 -- 9.7    Studies/Results: No results found. Anti-infectives: Anti-infectives     Start     Dose/Rate Route Frequency Ordered Stop   06/13/12 0924   vancomycin (VANCOCIN) 500 mg in sodium chloride 0.9 % 100 mL IVPB        500 mg 100 mL/hr over 60 Minutes Intravenous Every Hemodialysis 06/13/12 0925 06/13/12 1200   06/11/12 1448   vancomycin (VANCOCIN) IVPB 1000 mg/200 mL premix        1,000 mg 200 mL/hr over 60 Minutes Intravenous 60 min pre-op 06/11/12 1448 06/12/12 1330          Assessment/Plan: s/p Procedure(s) (LRB) with comments: BYPASS GRAFT FEMORAL-FEMORAL ARTERY (N/A) - Right to left femoral to femoral bypass Mobilize.  Home in 1-2 days when comfortable walking   LOS: 3 days   EARLY, TODD 06/15/2012, 8:11 AM

## 2012-06-15 NOTE — Progress Notes (Signed)
Inpatient Diabetes Program Recommendations  AACE/ADA: New Consensus Statement on Inpatient Glycemic Control (2013)  Target Ranges:  Prepandial:   less than 140 mg/dL      Peak postprandial:   less than 180 mg/dL (1-2 hours)      Critically ill patients:  140 - 180 mg/dL   Reason for Visit: Hyperglycemia and administration of NPH  Noted that patient only got 20 units NPH yesterday am, refusing his full 40 units. He then refused all his pm dose of NPH last HS. Resulting cbg this am high in 200's CBG before lunch ws 308 mg/dL, however NPH am dose ws not given until 11 am this morning.  RN states that pharmacy did not have the NPH until that time. Called pharmacy to assure that NPH is scheduled to be given by 8 am and to change the pm dose to HS as at home (rather than ac supper as previously scheduled).   Note: Thank you, Lenor Coffin, RN, CNS, Diabetes Coordinator [phone])

## 2012-06-15 NOTE — Progress Notes (Signed)
Physical Therapy Treatment Patient Details Name: [name] MRN: [id number] DOB: 06/27/1941 Today's Date: 06/15/2012 Time: 4098-1191 PT Time Calculation (min): 23 min  PT Assessment / Plan / Recommendation Comments on Treatment Session  S/p Fem-Fem R to L bypas graft; Progressing very well, with incr amb distance and decr need for physical assist with functional mobility; On track for dc home    Follow Up Recommendations  Home health PT    Barriers to Discharge        Equipment Recommendations   Discussed possible benefit of Rollator RW (pt can put small O2 tank on seat); Informed pt that sometimes insurance does not pay for rollator RWs    Recommendations for Other Services    Frequency Min 3X/week   Plan Discharge plan remains appropriate    Precautions / Restrictions Precautions Precautions: Fall Restrictions Other Position/Activity Restrictions: on home O2 2liters   Pertinent Vitals/Pain 8/10 groin pain initially; RN gave meds    Mobility  Bed Mobility Bed Mobility: Supine to Sit;Sitting - Scoot to Edge of Bed Supine to Sit: 4: Min assist;With rails Sitting - Scoot to Edge of Bed: 4: Min guard;With rail Details for Bed Mobility Assistance: Pt reports difficulty with getting up; gave her cues for sidelie to sit technique in hopes that LEs moving off bed to counterbalance trunk elevation will make task easier on pt Transfers Transfers: Sit to Stand;Stand to Sit Sit to Stand: 4: Min guard;From bed (without physical contact) Stand to Sit: 4: Min guard;To chair/3-in-1;With armrests (without physical contact) Details for Transfer Assistance: Cues for technique, hand palcement, safety; did not require physical assist Ambulation/Gait Ambulation/Gait Assistance: 4: Min guard (with and without physical contact) Ambulation Distance (Feet): 100 Feet Assistive device: Rolling walker Ambulation/Gait Assistance Details: Cues for posture; Making good progress Gait Pattern:  Step-to pattern Gait velocity: decr    Exercises  Alternating straight leg raises 20 reps Ankle Pumps both20reps Bridging 5 reps   PT Diagnosis:    PT Problem List:   PT Treatment Interventions:     PT Goals Acute Rehab PT Goals Time For Goal Achievement: 06/20/12 Potential to Achieve Goals: Good Pt will go Supine/Side to Sit: with modified independence PT Goal: Supine/Side to Sit - Progress: Progressing toward goal Pt will go Sit to Stand: with supervision PT Goal: Sit to Stand - Progress: Progressing toward goal Pt will go Stand to Sit: with supervision PT Goal: Stand to Sit - Progress: Progressing toward goal Pt will Ambulate: 51 - 150 feet;with supervision;with least restrictive assistive device PT Goal: Ambulate - Progress: Progressing toward goal  Visit Information  Last PT Received On: 06/15/12 Assistance Needed: +1    Subjective Data  Subjective: Is willing to work despite pain Patient Stated Goal: Be as independent as possible   Cognition  Overall Cognitive Status: Appears within functional limits for tasks assessed/performed Arousal/Alertness: Awake/alert Orientation Level: Appears intact for tasks assessed Behavior During Session: [facility] for tasks performed    Balance     End of Session PT - End of Session Activity Tolerance: Patient tolerated treatment well Patient left: in chair;with call bell/phone within reach Nurse Communication: Mobility status   GP     Van Clines [facility] [place], [place] [phone]  06/15/2012, 1:33 PM

## 2012-06-15 NOTE — Progress Notes (Signed)
Subjective: Interval History: has complaints constip, sore in groins.  Objective: Vital signs in last 24 hours: Temp:  [97.5 F (36.4 C)-99.4 F (37.4 C)] 98.4 F (36.9 C) (09/23 0928) Pulse Rate:  [60-74] 68  (09/23 0928) Resp:  [18-20] 20  (09/23 0928) BP: (130-153)/(37-63) 151/63 mmHg (09/23 0928) SpO2:  [98 %-100 %] 100 % (09/23 0928) Weight change:   Intake/Output from previous day: 09/22 0701 - 09/23 0700 In: 1200 [P.O.:1200] Out: 300 [Urine:300] Intake/Output this shift: Total I/O In: 240 [P.O.:240] Out: -   General appearance: alert, cooperative and pale Resp: diminished breath sounds bilaterally Cardio: S1, S2 normal and systolic murmur: systolic ejection 2/6, decrescendo at 2nd left intercostal space GI: pos bs, soft, liver down 4 cm  Lab Results:  [facility] 06/13/12 0607 06/12/12 1901  WBC 6.3 6.6  HGB 10.2* 10.7*  HCT 34.2* 36.1  PLT 151 PLATELET CLUMPS NOTED ON SMEAR, UNABLE TO ESTIMATE   BMET:  Basename 06/13/12 0607 06/12/12 1901  NA 140 --  K 5.7* --  CL 104 --  CO2 23 --  GLUCOSE 111* --  BUN 48* --  CREATININE 5.14* 4.29*  CALCIUM 9.4 --   No results found for this basename: PTH:2 in the last 72 hours Iron Studies: No results found for this basename: IRON,TIBC,TRANSFERRIN,FERRITIN in the last 72 hours  Studies/Results: No results found.  I have reviewed the patient's current medications.  Assessment/Plan: 1 ESRD for HD in am TTS 2 Anemia review meds 3 PVD per [facility] 4 DM fair control 5 CAD stable 6 ^ Lipids address outpatient  P HD, epo, mobilize, stool softener.    LOS: 3 days   Tammera Engert L 06/15/2012,11:03 AM

## 2012-06-15 NOTE — Progress Notes (Signed)
Occupational Therapy Evaluation Patient Details Name: [name] MRN: [id number] DOB: 09/05/1941 Today's Date: 06/15/2012 Time: 1700-1730 OT Time Calculation (min): 30 min  OT Assessment / Plan / Recommendation Clinical Impression  71 yo s/p fem fem R L bypass graft. Pt will benefit from skilled OT services to max independence with ADL and functinal moiblity for ADL due to below deficits. Pt will benefit from fabrication of median n splint to increase functional use L hand. Educated pt on importance of completing ROM L index and thumb. Will discuss with MD.  Pt with decrease MP and IP PROM due to joint stiffness.     OT Assessment  Patient needs continued OT Services    Follow Up Recommendations  Home health OT Will need f/u at hand clinic   Barriers to Discharge None    Equipment Recommendations  None recommended by OT    Recommendations for Other Services  none  Frequency  Min 2X/week    Precautions / Restrictions Precautions Precautions: Fall Precaution Comments: apparent median n palsy Restrictions Weight Bearing Restrictions: No Other Position/Activity Restrictions: on home O2 2liters (home O2 since 1-13)   Pertinent Vitals/Pain 4. Lleg    ADL  Eating/Feeding: Simulated;Set up (difficulty opening packages and cutting food) Where Assessed - Eating/Feeding: Bed level Grooming: Simulated;Supervision/safety Where Assessed - Grooming: Supine, head of bed up Upper Body Bathing: Simulated;Minimal assistance Where Assessed - Upper Body Bathing: Supine, head of bed up Lower Body Bathing: Simulated;Minimal assistance Where Assessed - Lower Body Bathing: Supported sit to stand Upper Body Dressing: Simulated;Minimal assistance Where Assessed - Upper Body Dressing: Supported sitting Lower Body Dressing: Simulated;Minimal assistance Where Assessed - Lower Body Dressing: Supported sit to Pharmacist, hospital: Mining engineer Method: Other  (comment) (bed - chair) Toileting - Clothing Manipulation and Hygiene: Simulated;Minimal assistance Where Assessed - Engineer, mining and Hygiene: Standing Equipment Used: Gait belt Transfers/Ambulation Related to ADLs: Min A ADL Comments: Lmited by endurance, decreased strength and decreased use L hand    OT Diagnosis: Generalized weakness;Acute pain  OT Problem List: Decreased strength;Decreased range of motion;Decreased activity tolerance;Decreased safety awareness;Decreased knowledge of use of DME or AE;Decreased knowledge of precautions;Impaired sensation;Impaired UE functional use;Pain OT Treatment Interventions: Self-care/ADL training;Therapeutic exercise;Energy conservation;DME and/or AE instruction;Therapeutic activities;Patient/family education   OT Goals Acute Rehab OT Goals OT Goal Formulation: With patient Time For Goal Achievement: 06/29/12 Potential to Achieve Goals: Good ADL Goals Pt Will Perform Upper Body Bathing: with set-up;with caregiver independent in assisting;Sitting, edge of bed;Unsupported ADL Goal: Upper Body Bathing - Progress: Goal set today Pt Will Perform Lower Body Bathing: with set-up;with caregiver independent in assisting;Sit to stand from bed;with cueing (comment type and amount);Unsupported;with adaptive equipment ADL Goal: Lower Body Bathing - Progress: Goal set today Pt Will Perform Upper Body Dressing: with set-up;with caregiver independent in assisting;Sitting, bed;Unsupported ADL Goal: Upper Body Dressing - Progress: Goal set today Pt Will Perform Lower Body Dressing: with set-up;with caregiver independent in assisting;Sit to stand from bed;Unsupported;with adaptive equipment;with cueing (comment type and amount) ADL Goal: Lower Body Dressing - Progress: Goal set today Pt Will Transfer to Toilet: with supervision;with caregiver independent in assisting;with DME ADL Goal: Toilet Transfer - Progress: Goal set today Arm Goals Pt Will  Tolerate PROM: with caregiver independent in performing;to decrease contracture;Left upper extremity;1 set;Other (comment) (L index MP ROMa dn thumb MP IP and CMC PROM) Arm Goal: PROM - Progress: Goal set today Miscellaneous OT Goals Miscellaneous OT Goal #1: Fabricate median na palsy slint to  increase functional use L hand.  OT Goal: Miscellaneous Goal #1 - Progress: Goal set today  Visit Information  Last OT Received On: 06/15/12 Assistance Needed: +1    Subjective Data      Prior Functioning  Vision/Perception  Home Living Lives With: Son Available Help at Discharge: Family;Available PRN/intermittently Type of Home: House Home Access: Level entry Home Layout: One level Bathroom Shower/Tub: Tub/shower unit;Door [employer] Toilet: Standard Bathroom Accessibility: Yes How Accessible: Accessible via walker Home Adaptive Equipment: Bedside commode/3-in-1;Hospital bed;Walker - rolling;Tub transfer bench;Wheelchair - manual Prior Function Level of Independence: Independent Able to Take Stairs?: Yes Driving: No Vocation: Retired Comments: Has not been able to use L index finger since dye injection Communication Communication: No difficulties Dominant Hand: Right      Cognition  Overall Cognitive Status: Appears within functional limits for tasks assessed/performed Arousal/Alertness: Awake/alert Orientation Level: Appears intact for tasks assessed Behavior During Session: [facility] for tasks performed    Extremity/Trunk Assessment Right Upper Extremity Assessment RUE ROM/Strength/Tone: Within functional levels Left Upper Extremity Assessment LUE ROM/Strength/Tone: Deficits LUE ROM/Strength/Tone Deficits: apparent median n palsy LUE Sensation: Deficits LUE Sensation Deficits: median n distribution LUE Coordination: Deficits LUE Coordination Deficits: DUE TO N DAMAGE Right Lower Extremity Assessment RLE ROM/Strength/Tone: WFL for tasks assessed Left Lower Extremity  Assessment LLE ROM/Strength/Tone: Deficits;Unable to fully assess;Due to pain;Due to precautions   Mobility  Shoulder Instructions  Bed Mobility Bed Mobility: Supine to Sit;Sitting - Scoot to Edge of Bed;Sit to Supine Supine to Sit: 5: Supervision;With rails;HOB elevated Sitting - Scoot to Edge of Bed: 5: Supervision;With rail Sit to Supine: 5: Supervision;With rail;HOB elevated Transfers Transfers: Sit to Stand;Stand to Sit Sit to Stand: 4: Min assist;With upper extremity assist;From bed Stand to Sit: 4: Min guard;With upper extremity assist;To bed       Exercise  L hand PROM   Balance     End of Session OT - End of Session Equipment Utilized During Treatment: Gait belt Activity Tolerance: Patient tolerated treatment well Patient left: in bed;with call bell/phone within reach;with family/visitor present Nurse Communication: Mobility status  GO     Machaela Caterino,HILLARY 06/15/2012, 5:39 PM [facility], OTR/L  [phone] 06/15/2012

## 2012-06-15 NOTE — Progress Notes (Signed)
ANTICOAGULATION CONSULT NOTE - Follow Up Consult  Pharmacy Consult for Coumadin Indication: atrial fibrillation  Allergies  Allergen Reactions  . Aspirin Anaphylaxis  . Penicillins Anaphylaxis  . Septra (Sulfamethoxazole W-Trimethoprim) Nausea And Vomiting  . Nsaids Itching    Patient Measurements: Height: 5\' 3"  (160 cm) Weight: 123 lb 3.8 oz (55.9 kg) IBW/kg (Calculated) : 52.4  Heparin Dosing Weight:   Vital Signs: Temp: 98.4 F (36.9 C) (09/23 0928) Temp src: Oral (09/23 0928) BP: 151/63 mmHg (09/23 0928) Pulse Rate: 68  (09/23 0928)  Labs:  Basename 06/15/12 0612 06/14/12 0630 06/13/12 0607 06/12/12 1901  HGB -- -- 10.2* 10.7*  HCT -- -- 34.2* 36.1  PLT -- -- 151 PLATELET CLUMPS NOTED ON SMEAR, UNABLE TO ESTIMATE  APTT -- -- -- --  LABPROT 17.0* 17.9* 18.1* --  INR 1.42 1.52* 1.55* --  HEPARINUNFRC -- -- -- --  CREATININE -- -- 5.14* 4.29*  CKTOTAL -- -- -- --  CKMB -- -- -- --  TROPONINI -- -- -- --    Estimated Creatinine Clearance: 8.4 ml/min (by C-G formula based on Cr of 5.14).   Medications:  Scheduled:    . amiodarone  200 mg Oral Daily  . darbepoetin (ARANESP) injection - DIALYSIS  100 mcg Intravenous Q Sat-HD  . darbepoetin (ARANESP) injection - DIALYSIS  60 mcg Intravenous Q Tue-HD  . docusate sodium  100 mg Oral Daily  . enoxaparin (LOVENOX) injection  30 mg Subcutaneous Q24H  . insulin aspart  0-15 Units Subcutaneous TID WC  . insulin NPH  10 Units Subcutaneous QAC supper  . insulin NPH  40 Units Subcutaneous Q breakfast  . isosorbide mononitrate  30 mg Oral BID  . lidocaine-prilocaine  1 application Topical Q T,Th,Sa-HD  . multivitamin  1 tablet Oral Daily  . mupirocin ointment   Nasal BID  . pantoprazole  40 mg Oral Q1200  . sevelamer  2,400 mg Oral TID WC  . simvastatin  20 mg Oral q1800  . warfarin  3 mg Oral ONCE-1800  . Warfarin - Pharmacist Dosing Inpatient   Does not apply q1800  . DISCONTD: furosemide  80 mg Oral BID     Assessment: 71yo female s/p fem-fem bypass pod#3 on Coumadin for AFib.  She is also receiving renally adjusted Lovenox for VTE px while INR sub-therapeutic.  INR is 1.42 this AM.  No bleeding problems noted.    Goal of Therapy:  INR 2-3 Monitor platelets by anticoagulation protocol: Yes   Plan:  1.  Coumadin 3mg  today 2.  Continue daily INR  Marisue Humble, PharmD Clinical Pharmacist [facility]

## 2012-06-16 ENCOUNTER — Inpatient Hospital Stay (HOSPITAL_COMMUNITY): Payer: Medicare Other

## 2012-06-16 ENCOUNTER — Telehealth: Payer: Self-pay | Admitting: Vascular Surgery

## 2012-06-16 ENCOUNTER — Encounter (HOSPITAL_COMMUNITY): Payer: Self-pay | Admitting: Vascular Surgery

## 2012-06-16 LAB — CBC
HCT: 32.8 % — ABNORMAL LOW (ref 36.0–46.0)
MCV: 84.3 fL (ref 78.0–100.0)
RBC: 3.89 MIL/uL (ref 3.87–5.11)
RDW: 17 % — ABNORMAL HIGH (ref 11.5–15.5)
WBC: 5.4 10*3/uL (ref 4.0–10.5)

## 2012-06-16 LAB — COMPREHENSIVE METABOLIC PANEL
Alkaline Phosphatase: 89 U/L (ref 39–117)
BUN: 59 mg/dL — ABNORMAL HIGH (ref 6–23)
Chloride: 92 mEq/L — ABNORMAL LOW (ref 96–112)
GFR calc Af Amer: 6 mL/min — ABNORMAL LOW (ref >90)
Glucose, Bld: 170 mg/dL — ABNORMAL HIGH (ref 70–99)
Potassium: 5.3 mEq/L — ABNORMAL HIGH (ref 3.5–5.1)
Total Bilirubin: 0.3 mg/dL (ref 0.3–1.2)

## 2012-06-16 LAB — GLUCOSE, CAPILLARY: Glucose-Capillary: 128 mg/dL — ABNORMAL HIGH (ref 70–99)

## 2012-06-16 LAB — PROTIME-INR: INR: 1.75 — ABNORMAL HIGH (ref 0.00–1.49)

## 2012-06-16 MED ORDER — WARFARIN SODIUM 2 MG PO TABS
2.0000 mg | ORAL_TABLET | Freq: Every day | ORAL | Status: DC
  Administered 2012-06-16 – 2012-06-17 (×2): 2 mg via ORAL
  Filled 2012-06-16 (×2): qty 1

## 2012-06-16 MED ORDER — SORBITOL 70 % SOLN
60.0000 mL | Status: AC
  Administered 2012-06-16: 60 mL via ORAL
  Filled 2012-06-16 (×2): qty 30
  Filled 2012-06-16: qty 60

## 2012-06-16 MED ORDER — OXYCODONE HCL 5 MG PO TABS: 5.0000 mg | ORAL_TABLET | Freq: Four times a day (QID) | ORAL | Status: DC | PRN

## 2012-06-16 MED ORDER — INSULIN NPH (HUMAN) (ISOPHANE) 100 UNIT/ML ~~LOC~~ SUSP: 6.0000 [IU] | Freq: Every day | SUBCUTANEOUS | Status: DC

## 2012-06-16 MED ORDER — DARBEPOETIN ALFA-POLYSORBATE 60 MCG/0.3ML IJ SOLN
INTRAMUSCULAR | Status: AC
  Filled 2012-06-16: qty 0.3

## 2012-06-16 MED ORDER — INSULIN NPH (HUMAN) (ISOPHANE) 100 UNIT/ML ~~LOC~~ SUSP: 36.0000 [IU] | Freq: Every day | SUBCUTANEOUS | Status: DC

## 2012-06-16 NOTE — Procedures (Signed)
I was present at this session.  I have reviewed the session itself and made appropriate changes. Bp mildly ^, lower vol  Mccade Sullenberger L 9/24/20137:39 AM

## 2012-06-16 NOTE — Telephone Encounter (Signed)
Message copied by Margaretmary Eddy on Tue Jun 16, 2012  9:59 AM ------      Message from: Sharee Pimple      Created: Tue Jun 16, 2012  8:14 AM      Regarding: schedule                   ----- Message -----         From: Dara Lords, PA         Sent: 06/16/2012   7:37 AM           To: Sharee Pimple, CMA            S/p  Right to left fem-fem bypass with 8 mm Hemashield graft      By TFE.            F/u with him in 2 weeks.            Thanks,      Lelon Mast

## 2012-06-16 NOTE — Progress Notes (Signed)
Subjective: Interval History: has complaints constip, sore L groin.  Objective: Vital signs in last 24 hours: Temp:  [97.8 F (36.6 C)-99 F (37.2 C)] 97.8 F (36.6 C) (09/24 0734) Pulse Rate:  [68-74] 73  (09/24 0734) Resp:  [20-22] 22  (09/24 0734) BP: (142-163)/(52-63) 142/57 mmHg (09/24 0734) SpO2:  [99 %-100 %] 99 % (09/24 0734) Weight:  [55.9 kg (123 lb 3.8 oz)-58.8 kg (129 lb 10.1 oz)] 58.8 kg (129 lb 10.1 oz) (09/24 0734) Weight change:   Intake/Output from previous day: 09/23 0701 - 09/24 0700 In: 960 [P.O.:960] Out: -  Intake/Output this shift:    General appearance: alert, cooperative and pale Resp: diminished breath sounds bilaterally, rhonchi bibasilar and wheezes bibasilar Cardio: S1, S2 normal and systolic murmur: systolic ejection 3/6, decrescendo at 2nd left intercostal space GI: soft, mod distension, pos bs Extremities: avf Skin: Skin color, texture, turgor normal. No rashes or lesions or erythema over incisions around tape  Lab Results: No results found for this basename: WBC:2,HGB:2,HCT:2,PLT:2 in the last 72 hours BMET: No results found for this basename: NA:2,K:2,CL:2,CO2:2,GLUCOSE:2,BUN:2,CREATININE:2,CALCIUM:2 in the last 72 hours No results found for this basename: PTH:2 in the last 72 hours Iron Studies: No results found for this basename: IRON,TIBC,TRANSFERRIN,FERRITIN in the last 72 hours  Studies/Results: No results found.  I have reviewed the patient's current medications.  Assessment/Plan: 1 ESRD for HD. Lower vol 2 HTN lower vol 3 PVD per VVS 4 constip give sorbitol 5 DM low bs eval meds 6 CM  7 MR lower vol 8 Anemia check labs  P HD  eval BS, sorbitol, vol control     LOS: 4 days   [name] L 06/16/2012,7:44 AM

## 2012-06-16 NOTE — Progress Notes (Signed)
ANTICOAGULATION CONSULT NOTE - Follow Up Consult  Pharmacy Consult for Coumadin Indication: atrial fibrillation  Allergies  Allergen Reactions  . Aspirin Anaphylaxis  . Penicillins Anaphylaxis  . Septra (Sulfamethoxazole W-Trimethoprim) Nausea And Vomiting  . Nsaids Itching    Patient Measurements: Height: 5\' 3"  (160 cm) Weight: 125 lb 10.6 oz (57 kg) IBW/kg (Calculated) : 52.4  Heparin Dosing Weight:   Vital Signs: Temp: 98.9 F (37.2 C) (09/24 1225) Temp src: Oral (09/24 1225) BP: 117/42 mmHg (09/24 1225) Pulse Rate: 67  (09/24 1225)  Labs:  Basename 06/16/12 0802 06/15/12 0612 06/14/12 0630  HGB 10.1* -- --  HCT 32.8* -- --  PLT 186 -- --  APTT -- -- --  LABPROT 19.8* 17.0* 17.9*  INR 1.75* 1.42 1.52*  HEPARINUNFRC -- -- --  CREATININE 6.98* -- --  CKTOTAL -- -- --  CKMB -- -- --  TROPONINI -- -- --    Estimated Creatinine Clearance: 6.2 ml/min (by C-G formula based on Cr of 6.98).   Medications:  Scheduled:    . amiodarone  200 mg Oral Daily  . darbepoetin (ARANESP) injection - DIALYSIS  100 mcg Intravenous Q Sat-HD  . darbepoetin (ARANESP) injection - DIALYSIS  60 mcg Intravenous Q Tue-HD  . docusate sodium  100 mg Oral Daily  . enoxaparin (LOVENOX) injection  30 mg Subcutaneous Q24H  . insulin aspart  0-15 Units Subcutaneous TID WC  . insulin NPH  36 Units Subcutaneous Q breakfast  . insulin NPH  6 Units Subcutaneous QAC supper  . isosorbide mononitrate  30 mg Oral BID  . lidocaine-prilocaine  1 application Topical Q T,Th,Sa-HD  . multivitamin  1 tablet Oral Daily  . mupirocin ointment   Nasal BID  . pantoprazole  40 mg Oral Q1200  . sevelamer  2,400 mg Oral TID WC  . simvastatin  20 mg Oral q1800  . sorbitol  60 mL Oral Q1H  . warfarin  3 mg Oral ONCE-1800  . Warfarin - Pharmacist Dosing Inpatient   Does not apply q1800  . DISCONTD: insulin NPH  10 Units Subcutaneous QAC supper  . DISCONTD: insulin NPH  40 Units Subcutaneous Q breakfast     Assessment: 71yo female with AFib, pod#4 fem-fem bypass.  INR 1.75 this AM.  No bleeding problems noted.  Goal of Therapy:  INR 2-3 Monitor platelets by anticoagulation protocol: Yes   Plan:  1.  Resume home dose Coumadin 2mg  daily 2.  Continue daily INR  Marisue Humble, PharmD Clinical Pharmacist [facility]

## 2012-06-16 NOTE — Progress Notes (Signed)
Subjective: Interval History: none.. complains of left groin and thigh soreness  Objective: Vital signs in last 24 hours: Temp:  [97.8 F (36.6 C)-99 F (37.2 C)] 97.8 F (36.6 C) (09/24 0734) Pulse Rate:  [68-74] 73  (09/24 0734) Resp:  [20-22] 22  (09/24 0734) BP: (142-163)/(52-63) 142/57 mmHg (09/24 0734) SpO2:  [99 %-100 %] 99 % (09/24 0734) Weight:  [123 lb 3.8 oz (55.9 kg)-129 lb 10.1 oz (58.8 kg)] 129 lb 10.1 oz (58.8 kg) (09/24 0734)  Intake/Output from previous day: 09/23 0701 - 09/24 0700 In: 960 [P.O.:960] Out: -  Intake/Output this shift:    Wounds healing well. 2+ femoral pulses bilaterally. 2+ popliteal pulses bilaterally  Lab Results: No results found for this basename: WBC:2,HGB:2,HCT:2,PLT:2 in the last 72 hours BMET No results found for this basename: NA:2,K:2,CL:2,CO2:2,GLUCOSE:2,BUN:2,CREATININE:2,CALCIUM:2 in the last 72 hours  Studies/Results: No results found. Anti-infectives: Anti-infectives     Start     Dose/Rate Route Frequency Ordered Stop   06/13/12 0924   vancomycin (VANCOCIN) 500 mg in sodium chloride 0.9 % 100 mL IVPB        500 mg 100 mL/hr over 60 Minutes Intravenous Every Hemodialysis 06/13/12 0925 06/13/12 1200   06/11/12 1448   vancomycin (VANCOCIN) IVPB 1000 mg/200 mL premix        1,000 mg 200 mL/hr over 60 Minutes Intravenous 60 min pre-op 06/11/12 1448 06/12/12 1330          Assessment/Plan: s/p Procedure(s) (LRB) with comments: BYPASS GRAFT FEMORAL-FEMORAL ARTERY (N/A) - Right to left femoral to femoral bypass Stable from right to left fem-fem bypass. Continue to mobilize. She does live independently and is not able to mobilize to a safe level currently. Hopefully discharge in a.m. if she is comfortable ambulating   LOS: 4 days   EARLY, TODD 06/16/2012, 7:41 AM

## 2012-06-16 NOTE — Discharge Summary (Signed)
[facility] Discharge Summary  [name] June 16, 1941 71 y.o. female  [id number]  Admission Date: 06/12/2012  Discharge Date: 06/17/12  Physician: Larina Earthly, MD  Admission Diagnosis: left foot ischemia   HPI:   This is a 71 y.o. female of Dr. Arbie Cookey is who is status post a right axillary to femoral bypass that was a redo in August 2012. The patient has done well until she visited Dr. Pricilla Holm who is a podiatrist for an ingrown toenail that told the patient she should followup here due to the fact he could not find pulses in her lower extremities. The patient states her left foot has been red and painful for about 2 weeks, she has no real complaints of right-sided pain. The patient states her left lower extremity hurts at rest as well as with exercise. She does have an open ulceration on her left shin that she states was a mosquito bite 3 weeks ago.   Hospital Course:  The patient was admitted to the hospital and taken to the operating room on 06/12/2012 and underwent Right to left fem-fem bypass with 8 mm Hemashield graft.  The pt tolerated the procedure well and was transported to the PACU in good condition. Renal service was notified of her admission for HD.  By POD 1, she had + doppler signals in her feet and wounds healing.  ABI's on 06/13/12 are as follows:  RIGHT    LEFT     PRESSURE  WAVEFORM   PRESSURE  WAVEFORM   BRACHIAL  AVF   BRACHIAL  162  Triphasic   DP  46  Monophasic  DP  78  Monophasic   AT    AT     PT  42  Monophasic  PT  76  Monophasic   PER    PER     GREAT TOE   NA  GREAT TOE   NA     RIGHT  LEFT   ABI  0.28  0.48   The right ABI=0.28, left ABI=0.48, which is indicative of severe arterial disease bilaterally.   The remainder of the hospital course consisted of increasing mobilization and increasing intake of solids without difficulty.  CBC    Component Value Date/Time   WBC 6.3 06/13/2012 0607   RBC 3.94 06/13/2012 0607   HGB 10.2*  06/13/2012 0607   HCT 34.2* 06/13/2012 0607   PLT 151 06/13/2012 0607   MCV 86.8 06/13/2012 0607   MCH 25.9* 06/13/2012 0607   MCHC 29.8* 06/13/2012 0607   RDW 18.2* 06/13/2012 0607   LYMPHSABS 1.3 08/07/2011 0146   MONOABS 0.4 08/07/2011 0146   EOSABS 0.1 08/07/2011 0146   BASOSABS 0.0 08/07/2011 0146    BMET    Component Value Date/Time   NA 140 06/13/2012 0607   K 5.7* 06/13/2012 0607   CL 104 06/13/2012 0607   CO2 23 06/13/2012 0607   GLUCOSE 111* 06/13/2012 0607   BUN 48* 06/13/2012 0607   CREATININE 5.14* 06/13/2012 0607   CALCIUM 9.4 06/13/2012 0607   GFRNONAA 8* 06/13/2012 0607   GFRAA 9* 06/13/2012 0607     Discharge Instructions:   The patient is discharged to home with extensive instructions on wound care and progressive ambulation.  They are instructed not to drive or perform any heavy lifting until returning to see the physician in his office.  Discharge Orders    Future Appointments: Provider: Department: Dept Phone: Center:   08/07/2012 1:00 PM Duane Lope  Myrtis Ser, MD [facility] [phone] [facility]     Future Orders Please Complete By Expires   Resume previous diet      Driving Restrictions      Comments:   No driving for 2 weeks   Lifting restrictions      Comments:   No lifting for 6 weeks   Call MD for:  temperature >100.5      Call MD for:  redness, tenderness, or signs of infection (pain, swelling, bleeding, redness, odor or green/yellow discharge around incision site)      Call MD for:  severe or increased pain, loss or decreased feeling  in affected limb(s)      may wash over wound with mild soap and water      Scheduling Instructions:   After washing wounds, pat dry and place dry gauze over wounds daily to keep both groins dry.      Discharge Diagnosis:  left foot ischemia  Secondary Diagnosis: Patient Active Problem List  Diagnosis  . HYPERLIPIDEMIA-MIXED  . Mitral regurgitation  . Ejection fraction  . Dyslipidemia  . CAD (coronary  artery disease)  . Hx of CABG  . ESRD (end stage renal disease)  . Diabetes mellitus  . Anemia  . PAD (peripheral artery disease)  . Aspirin allergy  . Hypertension  . Chronic systolic heart failure  . Back pain  . Cough  . Cardiomyopathy  . Carotid artery disease  . Atrial fibrillation  . Encounter for long-term (current) use of anticoagulants  . Chronic total occlusion of artery of the extremities  . Disturbance of skin sensation  . Preop cardiovascular exam  . H/O amiodarone therapy  . PVD (peripheral vascular disease)   Past Medical History  Diagnosis Date  . Mitral regurgitation     Mild-to-moderate, echo, January, 2010 / moderate, e eccentric jet, echo, March, 2012, moderate to moderately severe by bedside echocardiogram December 2012  . Ejection fraction     Ejection fraction 25-30% January, 2013  . Dyslipidemia   . CAD (coronary artery disease)     Nuclear, January, 2010, no ischemia  . Hx of CABG     2007  . Cardiomyopathy     Ischemic status post coronary bypass grafting  . Diabetes mellitus   . Anemia     Chronic  . PAD (peripheral artery disease)     Right axillofemoral bypass, Dr. Arbie Cookey status post redo right axillofemoral bypass August 2012  . Aspirin allergy     Takes Plavix  . Hypertension   . Carotid artery disease     Doppler, May, 2012, 50-69% R. ICA, 50-69% LICA  . Hyperlipidemia   . Arthritis   . Anxiety   . CHF (congestive heart failure) 03/23/12  . H/O amiodarone therapy     Started July, 2013  . PONV (postoperative nausea and vomiting)   . Atrial fibrillation     New onset July, 2013  . GERD (gastroesophageal reflux disease)   . ESRD (end stage renal disease) on dialysis     Dialysis, right arm shunt  / PCI tissue, interventional radiology September, 2010 / temporary catheter left subclavian, new AV fistula right arm maturing March, 201 t2  . ESRD     dialysis Tu,Thur,Sat  . COPD (chronic obstructive pulmonary disease)     uses oxygen 2  liters 24/7      [place], [place]  Home Medication Instructions [id number]   Printed on:06/16/12 0739  Medication Information  isosorbide mononitrate (IMDUR) 30 MG 24 hr tablet Take 30 mg by mouth 2 (two) times daily.            insulin NPH (HUMULIN N,NOVOLIN N) 100 UNIT/ML injection Inject 10-40 Units into the skin 2 (two) times daily. 40 units is the morning and 10 units every evening           sevelamer (RENVELA) 800 MG tablet Take 1,600-2,400 mg by mouth See admin instructions. Take 3 tablets with meals and 2 tablets with snacks           folic acid-vitamin b complex-vitamin c-selenium-zinc (DIALYVITE) 3 MG TABS Take 1 tablet by mouth daily.             ALPRAZolam (XANAX) 0.5 MG tablet Take 0.5 mg by mouth 3 (three) times daily as needed. For anxiety            HYDROcodone-acetaminophen (LORTAB) 7.5-500 MG per tablet Take 1 tablet by mouth every 8 (eight) hours as needed. For pain            furosemide (LASIX) 80 MG tablet Take 80 mg by mouth 2 (two) times daily.             albuterol (PROVENTIL) (2.5 MG/3ML) 0.083% nebulizer solution Take 2.5 mg by nebulization every 6 (six) hours as needed. For shortness of breath           warfarin (COUMADIN) 2 MG tablet Take 2 mg by mouth daily.            lidocaine-prilocaine (EMLA) cream Apply 1 application topically once. Prior to dialysis on Tues, Thurs, and Sat.           amiodarone (PACERONE) 200 MG tablet Take 1 tablet (200 mg total) by mouth daily.           pravastatin (PRAVACHOL) 40 MG tablet Take 40 mg by mouth daily.           omeprazole (PRILOSEC) 20 MG capsule Take 20 mg by mouth Daily.           doxycycline (VIBRAMYCIN) 100 MG capsule Take 100 mg by mouth 2 (two) times daily.           oxyCODONE (ROXICODONE) 5 MG immediate release tablet Take 1 tablet (5 mg total) by mouth every 6 (six) hours as needed for pain. #30 NR            Disposition: home  Patient's condition: is  Good  Follow up: 1. Dr. Arbie Cookey in 2 weeks   Doreatha Massed, PA-C [facility] [phone] 06/16/2012  7:39 AM  I have examined the patient, reviewed and agree with above.  Mckinlee Dunk, MD 06/19/2012 9:44 AM

## 2012-06-17 LAB — GLUCOSE, CAPILLARY
Glucose-Capillary: 210 mg/dL — ABNORMAL HIGH (ref 70–99)
Glucose-Capillary: 203 mg/dL — ABNORMAL HIGH (ref 70–99)

## 2012-06-17 MED ORDER — SEVELAMER CARBONATE 2.4 G PO PACK
2.4000 g | PACK | Freq: Three times a day (TID) | ORAL | Status: DC
  Administered 2012-06-17 (×2): 2.4 g via ORAL
  Filled 2012-06-17 (×4): qty 1

## 2012-06-17 MED ORDER — RENA-VITE PO TABS: 1.0000 | ORAL_TABLET | Freq: Every day | ORAL | Status: DC

## 2012-06-17 MED ORDER — FUROSEMIDE 80 MG PO TABS
80.0000 mg | ORAL_TABLET | Freq: Once | ORAL | Status: AC
  Administered 2012-06-17: 80 mg via ORAL
  Filled 2012-06-17: qty 1

## 2012-06-17 MED ORDER — HEPARIN SODIUM (PORCINE) 1000 UNIT/ML DIALYSIS: 100.0000 [IU]/kg | INTRAMUSCULAR | Status: DC | PRN

## 2012-06-17 NOTE — Progress Notes (Signed)
Following for progression and d/c needs. Noted PT recommendation for HHPT, MD/PA please order if appropriate, Thanks Johny Shock RN MPH Case Manager [phone]

## 2012-06-17 NOTE — Progress Notes (Signed)
Subjective: Interval History: none.  Objective: Vital signs in last 24 hours: Temp:  [97.2 F (36.2 C)-98.9 F (37.2 C)] 98.6 F (37 C) (09/25 0529) Pulse Rate:  [66-81] 66  (09/25 0529) Resp:  [14-22] 18  (09/25 0529) BP: (74-142)/(30-84) 109/46 mmHg (09/25 0529) SpO2:  [98 %-100 %] 98 % (09/25 0529) Weight:  [57 kg (125 lb 10.6 oz)-58.8 kg (129 lb 10.1 oz)] 57.1 kg (125 lb 14.1 oz) (09/24 2122) Weight change: 2.9 kg (6 lb 6.3 oz)  Intake/Output from previous day: 09/24 0701 - 09/25 0700 In: 120 [P.O.:120] Out: 1616  Intake/Output this shift:    General appearance: alert, cooperative and pale Resp: diminished breath sounds bilaterally Cardio: S1, S2 normal and systolic murmur: holosystolic 2/6, decrescendo and blowing at 2nd left intercostal space GI: soft. liver down 3 cm Extremities: healing , incisions groins,  avf  Lab Results:  Basename 06/16/12 0802  WBC 5.4  HGB 10.1*  HCT 32.8*  PLT 186   BMET:  Basename 06/16/12 0802  NA 130*  K 5.3*  CL 92*  CO2 26  GLUCOSE 170*  BUN 59*  CREATININE 6.98*  CALCIUM 9.2   No results found for this basename: PTH:2 in the last 72 hours Iron Studies: No results found for this basename: IRON,TIBC,TRANSFERRIN,FERRITIN in the last 72 hours  Studies/Results: No results found.  I have reviewed the patient's current medications.  Assessment/Plan: 1 CRF HD TTS.  Stable 2 Anemia stable 3 HPTH 4 PVD per VVS 5 DM better with less insulin 6 Afib on Amio  P HD if still here in am, contact unit if D/C    LOS: 5 days   Mickeal Daws L 06/17/2012,7:21 AM

## 2012-06-17 NOTE — Progress Notes (Signed)
Occupational Therapy Treatment Patient Details Name: [name] SEVILLANO MRN: [id number] DOB: Mar 24, 1941 Today's Date: 06/17/2012 Time: 4098-1191 OT Time Calculation (min): 30 min  OT Assessment / Plan / Recommendation Comments on Treatment Session Pt with apparent high Median n injury s/p procedure involving LUE per pt. Pt needs to f/u with hand clinic in [place] for treatment of Median n palsy. Attempted to call physician, Dr. Darrick Penna for persription, but page was not returned. Pt given all information regarding HEP and safety and vebalizes understanding.    Follow Up Recommendations  Outpatient OT -hand clinic in [facility]   Barriers to Discharge   none    Equipment Recommendations  None recommended by OT    Recommendations for Other Services    Frequency     Plan Discharge plan needs to be updated    Precautions / Restrictions Precautions Precautions: Fall Precaution Comments: apparent median n palsy Restrictions Other Position/Activity Restrictions: on home O2 2liters   Pertinent Vitals/Pain No pain    ADL  ADL Comments: Pt with apparent high Median n injury. Educated pt/family on importance of AA/PROM of all digits, including thumb. Pt with stiff L index MP and IP joints. Pt also given theraputty HEP. In addition to HEP, educated pt/family regarding safety issuses due to sensory deficits.     OT Diagnosis:    OT Problem List:   OT Treatment Interventions:     OT Goals Acute Rehab OT Goals OT Goal Formulation: With patient Time For Goal Achievement: 06/29/12 Potential to Achieve Goals: Good ADL Goals Pt Will Perform Upper Body Bathing: with set-up;with caregiver independent in assisting;Sitting, edge of bed;Unsupported ADL Goal: Upper Body Bathing - Progress: Progressing toward goals Pt Will Perform Lower Body Bathing: with set-up;with caregiver independent in assisting;Sit to stand from bed;with cueing (comment type and amount);Unsupported;with adaptive equipment ADL Goal: Lower  Body Bathing - Progress: Progressing toward goals Pt Will Perform Upper Body Dressing: with set-up;with caregiver independent in assisting;Sitting, bed;Unsupported ADL Goal: Upper Body Dressing - Progress: Progressing toward goals Pt Will Perform Lower Body Dressing: with set-up;with caregiver independent in assisting;Sit to stand from bed;Unsupported;with adaptive equipment;with cueing (comment type and amount) ADL Goal: Lower Body Dressing - Progress: Progressing toward goals Pt Will Transfer to Toilet: with supervision;with caregiver independent in assisting;with DME ADL Goal: Toilet Transfer - Progress: Met Arm Goals Pt Will Tolerate PROM: with caregiver independent in performing;to decrease contracture;Left upper extremity;1 set;Other (comment) Arm Goal: PROM - Progress: Met Miscellaneous OT Goals Miscellaneous OT Goal #1: Fabricate median na palsy slint to increase functional use L hand.  OT Goal: Miscellaneous Goal #1 - Progress: Not met  Visit Information  Last OT Received On: 06/17/12 Assistance Needed: +1    Subjective Data      Prior Functioning       Cognition  Overall Cognitive Status: Appears within functional limits for tasks assessed/performed Arousal/Alertness: Awake/alert Orientation Level: Appears intact for tasks assessed Behavior During Session: [facility] for tasks performed    Mobility  Shoulder Instructions Bed Mobility Bed Mobility: Supine to Sit Supine to Sit: 6: Modified independent (Device/Increase time) Details for Bed Mobility Assistance: Safely gets up without physical assist Transfers Sit to Stand: 5: Supervision;With upper extremity assist Stand to Sit: 5: Supervision;To chair/3-in-1 Details for Transfer Assistance: Cues for technique, hand palcement, safety; did not require physical assist       Exercises  Hand Exercises Forearm Supination: AROM;Left;5 reps;Seated Forearm Pronation: Self ROM;PROM;5 reps;Seated;Left Digit Composite Flexion:  PROM;AAROM;Left;5 reps;Seated Digit Composite Abduction: AROM;AAROM;Left;5  reps Digit Composite Adduction: AROM;AAROM;Left;5 reps Thumb Abduction: PROM;Left;5 reps Thumb Adduction: PROM;Left;5 reps Opposition: PROM;Left;5 reps   Balance     End of Session OT - End of Session Activity Tolerance: Patient tolerated treatment well Patient left: in bed;with call bell/phone within reach;with family/visitor present Nurse Communication: Other (comment) (need for f/u with outpt hand clinic)  GO     Jamylah Marinaccio,HILLARY 06/17/2012, 6:11 PM [facility], OTR/L  [phone] 06/17/2012

## 2012-06-17 NOTE — Progress Notes (Signed)
Physical Therapy Treatment Patient Details Name: KEAIRRA BARDON MRN: [id number] DOB: 1941/08/06 Today's Date: 06/17/2012 Time: 4098-1191 PT Time Calculation (min): 24 min  PT Assessment / Plan / Recommendation Comments on Treatment Session  continuing to improve in functional mobility, and OK for dc home from PT stnadpoint: Rollator RW is appropriate for pt; informed her that insurance may not cover cost of rollator RW    Follow Up Recommendations  Home health PT    Barriers to Discharge        Equipment Recommendations   (Possibly rollator RW)    Recommendations for Other Services    Frequency Min 3X/week   Plan Discharge plan remains appropriate    Precautions / Restrictions Precautions Precautions: Fall Precaution Comments: apparent median n palsy Restrictions Other Position/Activity Restrictions: on home O2 2liters   Pertinent Vitals/Pain No pain no apparent distress     Mobility  Bed Mobility Bed Mobility: Supine to Sit Supine to Sit: 6: Modified independent (Device/Increase time) Details for Bed Mobility Assistance: Safely gets up without physical assist Transfers Transfers: Sit to Stand;Stand to Sit Sit to Stand: 5: Supervision;With upper extremity assist Stand to Sit: 5: Supervision;To chair/3-in-1 Details for Transfer Assistance: Cues for technique, hand palcement, safety; did not require physical assist Ambulation/Gait Ambulation/Gait Assistance: 4: Min guard;5: Supervision Ambulation Distance (Feet): 150 Feet Assistive device: 4-wheeled walker Ambulation/Gait Assistance Details: Worked with rollator for pt to assess if she can control it and see if she likes it; She did quite well, including managing brakes and safely maneuvering self diuring sit to and from stand with rollator RW seat; Good control of rollator, and this device could be beneficial for pt Gait Pattern: Step-through pattern Stairs:  (Pt expressed confidence in ability to negotiate her 1 step)      Exercises     PT Diagnosis:    PT Problem List:   PT Treatment Interventions:     PT Goals Acute Rehab PT Goals Time For Goal Achievement: 06/20/12 Potential to Achieve Goals: Good Pt will go Supine/Side to Sit: with modified independence PT Goal: Supine/Side to Sit - Progress: Partly met Pt will go Sit to Stand: with supervision PT Goal: Sit to Stand - Progress: Partly met Pt will go Stand to Sit: with supervision PT Goal: Stand to Sit - Progress: Partly met Pt will Ambulate: 51 - 150 feet;with supervision;with least restrictive assistive device PT Goal: Ambulate - Progress: Partly met  Visit Information  Last PT Received On: 06/17/12 Assistance Needed: +1    Subjective Data  Subjective: Hopes to go home Patient Stated Goal: Be as independent as possible   Cognition  Overall Cognitive Status: Appears within functional limits for tasks assessed/performed Arousal/Alertness: Awake/alert Orientation Level: Appears intact for tasks assessed Behavior During Session: [facility] for tasks performed    Balance     End of Session     GP     Olen Pel [place], [place] [phone]  06/17/2012, 4:23 PM

## 2012-06-17 NOTE — Progress Notes (Signed)
ANTICOAGULATION CONSULT NOTE - Follow Up Consult  Pharmacy Consult for Coumadin Indication: atrial fibrillation  Allergies  Allergen Reactions  . Aspirin Anaphylaxis  . Penicillins Anaphylaxis  . Septra (Sulfamethoxazole W-Trimethoprim) Nausea And Vomiting  . Nsaids Itching    Patient Measurements: Height: 5\' 3"  (160 cm) Weight: 125 lb 14.1 oz (57.1 kg) IBW/kg (Calculated) : 52.4  Heparin Dosing Weight:   Vital Signs: Temp: 97.8 F (36.6 C) (09/25 1400) Temp src: Oral (09/25 1400) BP: 136/53 mmHg (09/25 1400) Pulse Rate: 74  (09/25 1400)  Labs:  Alvira Philips 06/17/12 0605 06/16/12 0802 06/15/12 0612  HGB -- 10.1* --  HCT -- 32.8* --  PLT -- 186 --  APTT -- -- --  LABPROT 21.9* 19.8* 17.0*  INR 2.00* 1.75* 1.42  HEPARINUNFRC -- -- --  CREATININE -- 6.98* --  CKTOTAL -- -- --  CKMB -- -- --  TROPONINI -- -- --    Estimated Creatinine Clearance: 6.2 ml/min (by C-G formula based on Cr of 6.98).   Medications:  Scheduled:     . amiodarone  200 mg Oral Daily  . darbepoetin (ARANESP) injection - DIALYSIS  60 mcg Intravenous Q Tue-HD  . docusate sodium  100 mg Oral Daily  . enoxaparin (LOVENOX) injection  30 mg Subcutaneous Q24H  . furosemide  80 mg Oral Once  . insulin aspart  0-15 Units Subcutaneous TID WC  . insulin NPH  36 Units Subcutaneous Q breakfast  . insulin NPH  6 Units Subcutaneous QAC supper  . isosorbide mononitrate  30 mg Oral BID  . lidocaine-prilocaine  1 application Topical Q T,Th,Sa-HD  . multivitamin  1 tablet Oral QHS  . mupirocin ointment   Nasal BID  . pantoprazole  40 mg Oral Q1200  . Sevelamer Carbonate  2.4 g Oral TID WC  . simvastatin  20 mg Oral q1800  . sorbitol  60 mL Oral Q1H  . warfarin  2 mg Oral q1800  . Warfarin - Pharmacist Dosing Inpatient   Does not apply q1800  . DISCONTD: darbepoetin (ARANESP) injection - DIALYSIS  100 mcg Intravenous Q Sat-HD  . DISCONTD: multivitamin  1 tablet Oral Daily  . DISCONTD: sevelamer  2,400  mg Oral TID WC    Assessment: 71 y.o. F resumed on warfarin for Afib on 9/21 after being held for a fem-fem bypass. The patient is also concurrently on low-dose lovenox for VTE prophylaxis while awaiting therapeutic INR. INR this a.m is therapeutic (INR 2, goal of 2-3). No CBC today, no bleeding noted.   Goal of Therapy:  INR 2-3   Plan:  1. Continue warfarin 2 mg daily 2. Will discontinue low-dose lovenox as INR therapeutic today 3. Will continue to monitor for any signs/symptoms of bleeding and will follow up with PT/INR in the a.m.   Georgina Pillion, PharmD, BCPS Clinical Pharmacist Pager: [phone] 06/17/2012 4:05 PM

## 2012-06-17 NOTE — Progress Notes (Signed)
[facility] Progress Note  06/17/2012 8:45 AM POD 5  Subjective:  No complaints.  Feels better today.  Afebrile x 24 hrs. Filed Vitals:   06/17/12 0529  BP: 109/46  Pulse: 66  Temp: 98.6 F (37 C)  Resp: 18    Physical Exam: Incisions:  Bilateral groin incisions are c/d/i with steri strips in place. Extremities:  Bilateral feet are warm and well perfused.  + graft pulse.  CBC    Component Value Date/Time   WBC 5.4 06/16/2012 0802   RBC 3.89 06/16/2012 0802   HGB 10.1* 06/16/2012 0802   HCT 32.8* 06/16/2012 0802   PLT 186 06/16/2012 0802   MCV 84.3 06/16/2012 0802   MCH 26.0 06/16/2012 0802   MCHC 30.8 06/16/2012 0802   RDW 17.0* 06/16/2012 0802   LYMPHSABS 1.3 08/07/2011 0146   MONOABS 0.4 08/07/2011 0146   EOSABS 0.1 08/07/2011 0146   BASOSABS 0.0 08/07/2011 0146    BMET    Component Value Date/Time   NA 130* 06/16/2012 0802   K 5.3* 06/16/2012 0802   CL 92* 06/16/2012 0802   CO2 26 06/16/2012 0802   GLUCOSE 170* 06/16/2012 0802   BUN 59* 06/16/2012 0802   CREATININE 6.98* 06/16/2012 0802   CALCIUM 9.2 06/16/2012 0802   GFRNONAA 5* 06/16/2012 0802   GFRAA 6* 06/16/2012 0802    INR    Component Value Date/Time   INR 2.00* 06/17/2012 0605   INR 2.2 06/03/2012     Intake/Output Summary (Last 24 hours) at 06/17/12 0845 Last data filed at 06/16/12 1455  Gross per 24 hour  Intake    120 ml  Output   1616 ml  Net  -1496 ml     Assessment/Plan:  71 y.o. female is s/p Right to left fem-fem bypass with 8 mm Hemashield graft  POD 5  -has ambulated in room.  Feels she can get around enough to go home. -will d/w Dr. Arbie Cookey. -f/u with Dr. Arbie Cookey in 2 weeks.  Doreatha Massed, PA-C [facility] [phone] 06/17/2012 8:45 AM

## 2012-06-17 NOTE — Progress Notes (Signed)
   CARE MANAGEMENT NOTE 06/17/2012  Patient:  [name], [name]   Account Number:  [id number]  Date Initiated:  06/17/2012  Documentation initiated by:  Gustave Lindeman  Subjective/Objective Assessment:   Pt will need walker, PT recommending rollalator. No HHPT needed.     Action/Plan:   Pt wishes to hava a rollalator, will order from [facility].   Anticipated DC Date:  06/18/2012   Anticipated DC Plan:  HOME/SELF CARE         PAC Choice  DURABLE MEDICAL EQUIPMENT   Choice offered to / List presented to:  C-1 Patient   DME arranged  Levan Hurst      DME agency  [facility]        Status of service:  Completed, signed off Medicare Important Message given?   (If response is "NO", the following Medicare IM given date fields will be blank) Date Medicare IM given:   Date Additional Medicare IM given:    Discharge Disposition:  HOME W self care  Per UR Regulation:    If discussed at Long Length of Stay Meetings, dates discussed:    Comments:

## 2012-06-19 ENCOUNTER — Other Ambulatory Visit: Payer: Self-pay | Admitting: *Deleted

## 2012-07-06 ENCOUNTER — Encounter: Payer: Self-pay | Admitting: Vascular Surgery

## 2012-07-07 ENCOUNTER — Ambulatory Visit (INDEPENDENT_AMBULATORY_CARE_PROVIDER_SITE_OTHER): Payer: Medicare Other | Admitting: Vascular Surgery

## 2012-07-07 ENCOUNTER — Encounter: Payer: Self-pay | Admitting: Vascular Surgery

## 2012-07-07 ENCOUNTER — Ambulatory Visit: Payer: Medicare Other | Admitting: Vascular Surgery

## 2012-07-07 VITALS — BP 136/51 | HR 76 | Resp 18 | Ht 63.0 in | Wt 123.0 lb

## 2012-07-07 DIAGNOSIS — Z48812 Encounter for surgical aftercare following surgery on the circulatory system: Secondary | ICD-10-CM

## 2012-07-07 DIAGNOSIS — I739 Peripheral vascular disease, unspecified: Secondary | ICD-10-CM

## 2012-07-07 NOTE — Addendum Note (Signed)
Addended by: Sharee Pimple on: 07/07/2012 03:42 PM   Modules accepted: Orders

## 2012-07-07 NOTE — Progress Notes (Signed)
The patient presents today for followup of her right to left fem-fem bypass on 06/16/2012. She had a prior right axillary to femoral bypass. She did have a prior fem-fem bypass in the past and this was removed for acute infection shortly after surgery for placement. She had progressive severe ischemia which was intolerable rest pain in her left foot. Fortunately following the surgery this is completely resolved. She does have some mild swelling in her left foot and did have some nailbed loss on the tip of her great toe nail and this is now healing quite nicely.  Physical exam she does have palpable femoral pulses bilaterally and a palpable graft pulse on the fem-fem area. There is no evidence of wound problems. She does have palpable left popliteal pulse. Her left foot is well-perfused.  She did see hand rehabilitation specialists who is buddy taped her second and third tears on the left. She does have the median nerve irritation from her brachial artery puncture for her arteriogram. This does appear to be improving as well. She will continue her activities.  We will see her again in 3 months for continued followup and ankle arm indices

## 2012-07-15 ENCOUNTER — Encounter: Payer: Self-pay | Admitting: *Deleted

## 2012-07-28 ENCOUNTER — Ambulatory Visit (INDEPENDENT_AMBULATORY_CARE_PROVIDER_SITE_OTHER): Payer: Medicare Other | Admitting: *Deleted

## 2012-07-28 DIAGNOSIS — Z7901 Long term (current) use of anticoagulants: Secondary | ICD-10-CM

## 2012-07-28 DIAGNOSIS — I4891 Unspecified atrial fibrillation: Secondary | ICD-10-CM

## 2012-07-28 DIAGNOSIS — I5022 Chronic systolic (congestive) heart failure: Secondary | ICD-10-CM

## 2012-07-31 ENCOUNTER — Telehealth: Payer: Self-pay | Admitting: *Deleted

## 2012-07-31 NOTE — Telephone Encounter (Signed)
Pt was started on Doxycycline 100mg  bid today by [facility] for area on lt leg.  They will see her back on Monday 11/11.  Told pt to continue coumadin 2mg  daily and appt made for INR check on 11/12.  Pt in agreement.

## 2012-08-04 ENCOUNTER — Ambulatory Visit (INDEPENDENT_AMBULATORY_CARE_PROVIDER_SITE_OTHER): Payer: Medicare Other | Admitting: *Deleted

## 2012-08-04 DIAGNOSIS — I4891 Unspecified atrial fibrillation: Secondary | ICD-10-CM

## 2012-08-04 DIAGNOSIS — Z7901 Long term (current) use of anticoagulants: Secondary | ICD-10-CM

## 2012-08-04 DIAGNOSIS — I5022 Chronic systolic (congestive) heart failure: Secondary | ICD-10-CM

## 2012-08-04 LAB — POCT INR: INR: 2.8

## 2012-08-07 ENCOUNTER — Ambulatory Visit: Payer: Medicare Other | Admitting: Cardiology

## 2012-08-11 ENCOUNTER — Ambulatory Visit (INDEPENDENT_AMBULATORY_CARE_PROVIDER_SITE_OTHER): Payer: Medicare Other | Admitting: *Deleted

## 2012-08-11 DIAGNOSIS — I4891 Unspecified atrial fibrillation: Secondary | ICD-10-CM

## 2012-08-11 DIAGNOSIS — I5022 Chronic systolic (congestive) heart failure: Secondary | ICD-10-CM

## 2012-08-11 DIAGNOSIS — Z7901 Long term (current) use of anticoagulants: Secondary | ICD-10-CM

## 2012-08-18 ENCOUNTER — Ambulatory Visit (INDEPENDENT_AMBULATORY_CARE_PROVIDER_SITE_OTHER): Payer: Medicare Other | Admitting: *Deleted

## 2012-08-18 DIAGNOSIS — I4891 Unspecified atrial fibrillation: Secondary | ICD-10-CM

## 2012-08-18 DIAGNOSIS — Z7901 Long term (current) use of anticoagulants: Secondary | ICD-10-CM

## 2012-08-18 DIAGNOSIS — I5022 Chronic systolic (congestive) heart failure: Secondary | ICD-10-CM

## 2012-08-31 ENCOUNTER — Ambulatory Visit (INDEPENDENT_AMBULATORY_CARE_PROVIDER_SITE_OTHER): Payer: Medicare Other | Admitting: Cardiology

## 2012-08-31 ENCOUNTER — Encounter: Payer: Self-pay | Admitting: Cardiology

## 2012-08-31 ENCOUNTER — Ambulatory Visit (INDEPENDENT_AMBULATORY_CARE_PROVIDER_SITE_OTHER): Payer: Medicare Other | Admitting: *Deleted

## 2012-08-31 VITALS — BP 149/62 | HR 72 | Ht 63.0 in | Wt 125.0 lb

## 2012-08-31 DIAGNOSIS — I739 Peripheral vascular disease, unspecified: Secondary | ICD-10-CM

## 2012-08-31 DIAGNOSIS — I10 Essential (primary) hypertension: Secondary | ICD-10-CM

## 2012-08-31 DIAGNOSIS — I2510 Atherosclerotic heart disease of native coronary artery without angina pectoris: Secondary | ICD-10-CM

## 2012-08-31 DIAGNOSIS — N186 End stage renal disease: Secondary | ICD-10-CM

## 2012-08-31 DIAGNOSIS — IMO0002 Reserved for concepts with insufficient information to code with codable children: Secondary | ICD-10-CM

## 2012-08-31 DIAGNOSIS — I4891 Unspecified atrial fibrillation: Secondary | ICD-10-CM

## 2012-08-31 DIAGNOSIS — I5022 Chronic systolic (congestive) heart failure: Secondary | ICD-10-CM

## 2012-08-31 DIAGNOSIS — Z7901 Long term (current) use of anticoagulants: Secondary | ICD-10-CM

## 2012-08-31 DIAGNOSIS — R0989 Other specified symptoms and signs involving the circulatory and respiratory systems: Secondary | ICD-10-CM

## 2012-08-31 DIAGNOSIS — I779 Disorder of arteries and arterioles, unspecified: Secondary | ICD-10-CM

## 2012-08-31 DIAGNOSIS — I059 Rheumatic mitral valve disease, unspecified: Secondary | ICD-10-CM

## 2012-08-31 DIAGNOSIS — R9430 Abnormal result of cardiovascular function study, unspecified: Secondary | ICD-10-CM

## 2012-08-31 DIAGNOSIS — I340 Nonrheumatic mitral (valve) insufficiency: Secondary | ICD-10-CM

## 2012-08-31 LAB — PROTIME-INR

## 2012-08-31 LAB — POCT INR: INR: 5.2

## 2012-08-31 MED ORDER — AMIODARONE HCL 200 MG PO TABS: ORAL_TABLET | ORAL | Status: DC

## 2012-08-31 NOTE — Assessment & Plan Note (Signed)
The patient has severe left ventricular dysfunction. Am hesitant to change any of her medications at this time. Over time I will rereview all of these issues to see what medications can be helpful in this patient with end-stage renal disease.

## 2012-08-31 NOTE — Assessment & Plan Note (Signed)
The patient has had extensive surgery by Dr. Arbie Cookey. She is stable at this time.

## 2012-08-31 NOTE — Assessment & Plan Note (Signed)
The patient had no ischemia in the past by nuclear. She's not having symptoms. No further testing at this time.

## 2012-08-31 NOTE — Assessment & Plan Note (Signed)
Patient continues on dialysis. This controlled her volume.

## 2012-08-31 NOTE — Progress Notes (Signed)
HPI  The patient is seen to followup left ventricular dysfunction and atrial fibrillation. I saw her last August, 2013. Since that time she's had further vascular surgery by Dr. Arbie Cookey. She's doing well in this regard. Unfortunately she is having difficulty moving 2 fingers in her left hand. This may be some type of neurologic problem and she is getting further evaluation. She's not having any significant chest pain. Her volume status is being controlled with dialysis. She has not had any recurrent symptomatic atrial fibrillation. Initially she had been on 400 mg of amiodarone daily. The dose was decreased to 200 mg daily in August, 2013.  She's also having difficulty with her left knee. This appears to be an orthopedic type of issue.  Allergies  Allergen Reactions  . Aspirin Anaphylaxis  . Penicillins Anaphylaxis  . Septra (Sulfamethoxazole W-Trimethoprim) Nausea And Vomiting  . Nsaids Itching    Current Outpatient Prescriptions  Medication Sig Dispense Refill  . albuterol (PROVENTIL) (2.5 MG/3ML) 0.083% nebulizer solution Take 2.5 mg by nebulization every 6 (six) hours as needed. For shortness of breath      . ALPRAZolam (XANAX) 0.5 MG tablet Take 0.5 mg by mouth 3 (three) times daily as needed. For anxiety       . amiodarone (PACERONE) 200 MG tablet Take 1 tablet (200 mg total) by mouth daily.  30 tablet  6  . folic acid-vitamin b complex-vitamin c-selenium-zinc (DIALYVITE) 3 MG TABS Take 1 tablet by mouth daily.        . furosemide (LASIX) 80 MG tablet Take 80 mg by mouth 2 (two) times daily.        Marland Kitchen HYDROcodone-acetaminophen (VICODIN) 5-500 MG per tablet Take 1 tablet by mouth every 8 (eight) hours as needed.      . insulin NPH (HUMULIN N,NOVOLIN N) 100 UNIT/ML injection Inject 10-40 Units into the skin 2 (two) times daily. 40 units is the morning and 10 units every evening      . isosorbide dinitrate (ISORDIL) 20 MG tablet Take 20 mg by mouth 2 (two) times daily.      Marland Kitchen  lidocaine-prilocaine (EMLA) cream Apply 1 application topically once. Prior to dialysis on Tues, Thurs, and Sat.      Marland Kitchen omeprazole (PRILOSEC) 20 MG capsule Take 20 mg by mouth Daily.      . pravastatin (PRAVACHOL) 40 MG tablet Take 40 mg by mouth daily.      . sevelamer (RENVELA) 800 MG tablet Take 1,600-2,400 mg by mouth See admin instructions. Take 3 tablets with meals and 2 tablets with snacks      . warfarin (COUMADIN) 2 MG tablet Take 2 mg by mouth daily.         History   Social History  . Marital Status: Widowed    Spouse Name: RALPH    Number of Children: N/A  . Years of Education: N/A   Occupational History  . RETIRED     [employer]   Social History Main Topics  . Smoking status: Former Smoker -- 2.0 packs/day for 50 years    Types: Cigarettes    Quit date: 09/23/2005  . Smokeless tobacco: Former Neurosurgeon    Quit date: 09/23/2005     Comment: SMOKED FROM AGE 60 UNTIL 2007  . Alcohol Use: No     Comment: NO ALCOHOL SINCE THE 70'S  . Drug Use: No  . Sexually Active: Not on file   Other Topics Concern  . Not on file  Social History Narrative  . No narrative on file    Family History  Problem Relation Age of Onset  . Diabetes Mother   . Kidney disease Father     Past Medical History  Diagnosis Date  . Mitral regurgitation     Mild-to-moderate, echo, January, 2010 / moderate, e eccentric jet, echo, March, 2012, moderate to moderately severe by bedside echocardiogram December 2012  . Ejection fraction     Ejection fraction 25-30% January, 2013  . Dyslipidemia   . CAD (coronary artery disease)     Nuclear, January, 2010, no ischemia  . Hx of CABG     2007  . Cardiomyopathy     Ischemic status post coronary bypass grafting  . Diabetes mellitus   . Anemia     Chronic  . PAD (peripheral artery disease)     Right axillofemoral bypass, Dr. Arbie Cookey status post redo right axillofemoral bypass August 2012  . Aspirin allergy     Takes Plavix  .  Hypertension   . Carotid artery disease     Doppler, May, 2012, 50-69% R. ICA, 50-69% LICA  . Hyperlipidemia   . Arthritis   . Anxiety   . CHF (congestive heart failure) 03/23/12  . H/O amiodarone therapy     Started July, 2013  . PONV (postoperative nausea and vomiting)   . Atrial fibrillation     New onset July, 2013  . GERD (gastroesophageal reflux disease)   . ESRD (end stage renal disease) on dialysis     Dialysis, right arm shunt  / PCI tissue, interventional radiology September, 2010 / temporary catheter left subclavian, new AV fistula right arm maturing March, 201 t2  . ESRD     dialysis Tu,Thur,Sat  . COPD (chronic obstructive pulmonary disease)     uses oxygen 2 liters 24/7    Past Surgical History  Procedure Date  . Coronary artery bypass graft  07/27/2006    [name]. [name], M.D  . Abdominal hysterectomy   . Laparoscopic salpingoopherectomy   . Appendectomy   . Fem-fem bypass 2003    left to right   . Right femoral bypass 2004  . Basal cell carcinoma excision 2004    REMOVAL FROM NOSE  . Arteriovenous graft placement right  11-30-2010,  05-20-2011  . Femoral-femoral bypass graft 06/12/2012    Procedure: BYPASS GRAFT FEMORAL-FEMORAL ARTERY;  Surgeon: Larina Earthly, MD;  Location: [facility];  Service: Vascular;  Laterality: N/A;  Right to left femoral to femoral bypass    Patient Active Problem List  Diagnosis  . HYPERLIPIDEMIA-MIXED  . Mitral regurgitation  . Ejection fraction  . Dyslipidemia  . CAD (coronary artery disease)  . Hx of CABG  . ESRD (end stage renal disease)  . Diabetes mellitus  . Anemia  . PAD (peripheral artery disease)  . Aspirin allergy  . Hypertension  . Chronic systolic heart failure  . Back pain  . Cough  . Cardiomyopathy  . Carotid artery disease  . Atrial fibrillation  . Encounter for long-term (current) use of anticoagulants  . Chronic total occlusion of artery of the extremities  . Disturbance of skin sensation  . Preop  cardiovascular exam  . H/O amiodarone therapy  . PVD (peripheral vascular disease)  . PVD (peripheral vascular disease) with claudication    ROS   Patient denies fever, chills, headache, sweats, rash, change in vision, change in hearing, chest pain, cough, nausea vomiting, urinary symptoms. She says that she feels warm immediately  after taking Coumadin the night. All other systems are reviewed and are negative.  PHYSICAL EXAM  Patient is here with a family member. She is oriented to person time and place. Affect is normal. She is in a wheelchair. She has carotid bruits. There is no jugulovenous distention. She has kyphosis of the spine. Examination of the lungs reveal decreased breath sounds. No obvious rales are heard. There is no respiratory distress. Cardiac exam reveals S1 and S2. There is a systolic murmur that seems to be most likely from her mitral valve disease. The abdomen is soft. There is no significant peripheral edema. There are no skin rashes. She has 2 of her fingers on her left hand taped together. These are the to the do not move well. There is no swelling or redness  Filed Vitals:   08/31/12 1358  BP: 149/62  Pulse: 72  Height: 5\' 3"  (1.6 m)  Weight: 125 lb (56.7 kg)  SpO2: 100%   EKG is done today and reviewed by me. There is some wandering of the baseline. However there is sinus rhythm.  ASSESSMENT & PLAN

## 2012-08-31 NOTE — Assessment & Plan Note (Signed)
She is holding sinus rhythm. I had reduced her amiodarone from 400-200 mg in August, 2013. We will reduce the dose to 150 mg daily at this time by alternating 200 mg one day and 100 mg another day.

## 2012-08-31 NOTE — Patient Instructions (Addendum)
Your physician recommends that you schedule a follow-up appointment in: 3 months. Your physician has recommended you make the following change in your medication: Decrease your amiodarone to alternating 100 mg and 200 mg daily. Take 1 tablet alternating with 1/2 tablet daily. Your new prescription has been sent to your pharmacy. All other medications will remain the same. Your physician has requested that you have a carotid duplex. This test is an ultrasound of the carotid arteries in your neck. It looks at blood flow through these arteries that supply the brain with blood. Allow one hour for this exam. There are no restrictions or special instructions. PLEASE HOLD YOUR WARFARIN UNTIL YOU HEAR FROM [facility] TODAY. Your physician recommends that you return for lab work in: Please go to [facility] today for PT/INR check.

## 2012-08-31 NOTE — Assessment & Plan Note (Signed)
We know that she has moderate mitral regurgitation. This is to be followed.

## 2012-08-31 NOTE — Assessment & Plan Note (Signed)
Patient has significant carotid artery disease. She has bruits. Her Doppler was done last in May, 2012. I will arrange for carotid Doppler followup.

## 2012-08-31 NOTE — Assessment & Plan Note (Signed)
Blood pressures control. No change in therapy. 

## 2012-09-01 NOTE — Progress Notes (Signed)
Please see new anticoagulant encounter for details.

## 2012-09-08 ENCOUNTER — Ambulatory Visit (INDEPENDENT_AMBULATORY_CARE_PROVIDER_SITE_OTHER): Payer: Medicare Other | Admitting: *Deleted

## 2012-09-08 DIAGNOSIS — Z7901 Long term (current) use of anticoagulants: Secondary | ICD-10-CM

## 2012-09-08 DIAGNOSIS — I5022 Chronic systolic (congestive) heart failure: Secondary | ICD-10-CM

## 2012-09-08 DIAGNOSIS — I4891 Unspecified atrial fibrillation: Secondary | ICD-10-CM

## 2012-09-08 LAB — POCT INR: INR: 3.8

## 2012-09-18 ENCOUNTER — Other Ambulatory Visit: Payer: Self-pay | Admitting: Cardiology

## 2012-09-18 MED ORDER — WARFARIN SODIUM 2 MG PO TABS: 2.0000 mg | ORAL_TABLET | Freq: Every day | ORAL | Status: DC

## 2012-09-21 ENCOUNTER — Encounter: Payer: Self-pay | Admitting: Cardiology

## 2012-09-21 DIAGNOSIS — I5023 Acute on chronic systolic (congestive) heart failure: Secondary | ICD-10-CM

## 2012-09-21 DIAGNOSIS — I509 Heart failure, unspecified: Secondary | ICD-10-CM

## 2012-09-22 ENCOUNTER — Encounter: Payer: Self-pay | Admitting: Cardiology

## 2012-09-22 DIAGNOSIS — I5023 Acute on chronic systolic (congestive) heart failure: Secondary | ICD-10-CM

## 2012-09-30 ENCOUNTER — Ambulatory Visit (INDEPENDENT_AMBULATORY_CARE_PROVIDER_SITE_OTHER): Payer: Medicare Other | Admitting: Cardiology

## 2012-09-30 ENCOUNTER — Encounter: Payer: Self-pay | Admitting: Cardiology

## 2012-09-30 VITALS — BP 161/63 | HR 67 | Ht 63.0 in | Wt 123.0 lb

## 2012-09-30 DIAGNOSIS — I2510 Atherosclerotic heart disease of native coronary artery without angina pectoris: Secondary | ICD-10-CM

## 2012-09-30 DIAGNOSIS — R5383 Other fatigue: Secondary | ICD-10-CM

## 2012-09-30 DIAGNOSIS — I10 Essential (primary) hypertension: Secondary | ICD-10-CM

## 2012-09-30 DIAGNOSIS — R9430 Abnormal result of cardiovascular function study, unspecified: Secondary | ICD-10-CM

## 2012-09-30 DIAGNOSIS — I059 Rheumatic mitral valve disease, unspecified: Secondary | ICD-10-CM

## 2012-09-30 DIAGNOSIS — N186 End stage renal disease: Secondary | ICD-10-CM

## 2012-09-30 DIAGNOSIS — I779 Disorder of arteries and arterioles, unspecified: Secondary | ICD-10-CM

## 2012-09-30 DIAGNOSIS — R5381 Other malaise: Secondary | ICD-10-CM

## 2012-09-30 DIAGNOSIS — R0989 Other specified symptoms and signs involving the circulatory and respiratory systems: Secondary | ICD-10-CM

## 2012-09-30 DIAGNOSIS — Z9229 Personal history of other drug therapy: Secondary | ICD-10-CM

## 2012-09-30 DIAGNOSIS — I5022 Chronic systolic (congestive) heart failure: Secondary | ICD-10-CM

## 2012-09-30 DIAGNOSIS — I340 Nonrheumatic mitral (valve) insufficiency: Secondary | ICD-10-CM

## 2012-09-30 MED ORDER — AMIODARONE HCL 200 MG PO TABS: 100.0000 mg | ORAL_TABLET | Freq: Every day | ORAL | Status: DC

## 2012-09-30 NOTE — Assessment & Plan Note (Signed)
For a long time the patient had significant hypotension with dialysis. More recently this has not been a problem. With this she tends to have some hypertension at times. However on hesitant to push her medications any further.

## 2012-09-30 NOTE — Assessment & Plan Note (Signed)
She has significant carotid disease. It is time for a followup carotid Doppler. I believe she will be having this next week

## 2012-09-30 NOTE — Patient Instructions (Addendum)
Your physician recommends that you schedule a follow-up appointment in: 3 months. Your physician has recommended you make the following change in your medication: stop metoprolol. Decrease amiodarone to 100 mg daily. Please take 1/2 of your 200 mg tablet. Your pharmacy has been notified about the change. All other medications will remain the same. Your physician recommends that you return for lab work at [facility] for [facility]. Your physician has requested that you have an echocardiogram. Echocardiography is a painless test that uses sound waves to create images of your heart. It provides your doctor with information about the size and shape of your heart and how well your heart's chambers and valves are working. This procedure takes approximately one hour. There are no restrictions for this procedure. Please call our office in several days to let us know if your symptoms of fatigue and sleepiness has improved.

## 2012-09-30 NOTE — Assessment & Plan Note (Signed)
The patient has significant left ventricular dysfunction. At some point soon I will consider a followup echo.

## 2012-09-30 NOTE — Assessment & Plan Note (Signed)
Patient continues on dialysis. This is stable at this time.

## 2012-09-30 NOTE — Assessment & Plan Note (Signed)
The patient thinks that she has fatigue from her very low dose of metoprolol. This will be stopped. I do not see a TSH from her recent labs. This is particularly important with her amiodarone therapy. I will check the recent hospital data. If there is no TSH this will be obtained.

## 2012-09-30 NOTE — Assessment & Plan Note (Signed)
The patient has been stable with amiodarone. I had reduced her dose to 200 mg alternating with 100 mg. She is holding sinus rhythm. Amiodarone will be decreased to 100 mg daily.

## 2012-09-30 NOTE — Assessment & Plan Note (Signed)
She had recent hospitalization with systolic heart failure. Her volume is controlled with dialysis.

## 2012-09-30 NOTE — Assessment & Plan Note (Signed)
We know the patient has had significant mitral regurgitation in the past. I will arrange for a followup echo but not at this time.

## 2012-09-30 NOTE — Assessment & Plan Note (Signed)
Coronary disease has been stable. No further workup now.

## 2012-09-30 NOTE — Progress Notes (Signed)
HPI  Patient is seen post hospitalization followup CHF and carotid artery disease. Since the last vascular procedure to her arm she has done well in terms of dialysis. She was admitted to the hospital in December, 2013. She had an exacerbation of heart failure. Her meds were adjusted. She stabilize. Because she was not on a beta blocker a small dose of metoprolol was started. She says that since that time she is felt sleepy all of the time. She thinks it is from the metoprolol. It is possible. It is a very small dose. She's not having any chest pain.  As part of today's evaluation I spent an extensive amount of time reviewing the hospital records and her prior data. I've also had a long discussion with her about her multiple issues.  Allergies  Allergen Reactions  . Aspirin Anaphylaxis  . Penicillins Anaphylaxis  . Septra (Sulfamethoxazole W-Trimethoprim) Nausea And Vomiting  . Nsaids Itching    Current Outpatient Prescriptions  Medication Sig Dispense Refill  . albuterol (PROVENTIL) (2.5 MG/3ML) 0.083% nebulizer solution Take 2.5 mg by nebulization every 6 (six) hours as needed. For shortness of breath      . ALPRAZolam (XANAX) 0.5 MG tablet Take 0.5 mg by mouth 3 (three) times daily as needed. For anxiety       . amiodarone (PACERONE) 200 MG tablet (Take 200 mg alternating with 100 mg daily)  Take 1 tablet daily alternating with 1/2 tablet daily  25 tablet  3  . folic acid-vitamin b complex-vitamin c-selenium-zinc (DIALYVITE) 3 MG TABS Take 1 tablet by mouth daily.        . furosemide (LASIX) 80 MG tablet Take 80 mg by mouth 2 (two) times daily. 1.5 tablets in morning; 1 tablet noon.      Marland Kitchen HYDROcodone-acetaminophen (VICODIN) 5-500 MG per tablet Take 1 tablet by mouth every 8 (eight) hours as needed.      . insulin NPH (HUMULIN N,NOVOLIN N) 100 UNIT/ML injection Inject 10-40 Units into the skin 2 (two) times daily. 40 units is the morning and 10 units every evening      . isosorbide  dinitrate (ISORDIL) 20 MG tablet Take 20 mg by mouth 2 (two) times daily.      Marland Kitchen lidocaine-prilocaine (EMLA) cream Apply 1 application topically once. Prior to dialysis on Tues, Thurs, and Sat.      . metoprolol succinate (TOPROL-XL) 25 MG 24 hr tablet Take 12.5 mg by mouth daily.      Marland Kitchen omeprazole (PRILOSEC) 20 MG capsule Take 20 mg by mouth Daily.      . pravastatin (PRAVACHOL) 40 MG tablet Take 40 mg by mouth daily.      . sevelamer (RENVELA) 800 MG tablet Take 1,600-2,400 mg by mouth See admin instructions. Take 3 tablets with meals and 2 tablets with snacks      . warfarin (COUMADIN) 2 MG tablet Take 1 tablet (2 mg total) by mouth daily.  30 tablet  3    History   Social History  . Marital Status: Widowed    Spouse Name: RALPH    Number of Children: N/A  . Years of Education: N/A   Occupational History  . RETIRED     [employer]   Social History Main Topics  . Smoking status: Former Smoker -- 2.0 packs/day for 50 years    Types: Cigarettes    Quit date: 09/23/2005  . Smokeless tobacco: Former Neurosurgeon    Quit date: 09/23/2005  Comment: SMOKED FROM AGE 37 UNTIL 2007  . Alcohol Use: No     Comment: NO ALCOHOL SINCE THE 70'S  . Drug Use: No  . Sexually Active: Not on file   Other Topics Concern  . Not on file   Social History Narrative  . No narrative on file    Family History  Problem Relation Age of Onset  . Diabetes Mother   . Kidney disease Father     Past Medical History  Diagnosis Date  . Mitral regurgitation     Mild-to-moderate, echo, January, 2010 / moderate, e eccentric jet, echo, March, 2012, moderate to moderately severe by bedside echocardiogram December 2012  . Ejection fraction     Ejection fraction 25-30% January, 2013  . Dyslipidemia   . CAD (coronary artery disease)     Nuclear, January, 2010, no ischemia  . Hx of CABG     2007  . Cardiomyopathy     Ischemic status post coronary bypass grafting  . Diabetes mellitus   .  Anemia     Chronic  . PAD (peripheral artery disease)     Right axillofemoral bypass, Dr. Arbie Cookey status post redo right axillofemoral bypass August 2012  . Aspirin allergy     Takes Plavix  . Hypertension   . Carotid artery disease     Doppler, May, 2012, 50-69% R. ICA, 50-69% LICA  . Hyperlipidemia   . Arthritis   . Anxiety   . CHF (congestive heart failure) 03/23/12  . H/O amiodarone therapy     Started July, 2013  . PONV (postoperative nausea and vomiting)   . Atrial fibrillation     New onset July, 2013  . GERD (gastroesophageal reflux disease)   . ESRD (end stage renal disease) on dialysis     Dialysis, right arm shunt  / PCI tissue, interventional radiology September, 2010 / temporary catheter left subclavian, new AV fistula right arm maturing March, 201 t2  . ESRD     dialysis Tu,Thur,Sat  . COPD (chronic obstructive pulmonary disease)     uses oxygen 2 liters 24/7    Past Surgical History  Procedure Date  . Coronary artery bypass graft  07/27/2006    [name]. [name], M.D  . Abdominal hysterectomy   . Laparoscopic salpingoopherectomy   . Appendectomy   . Fem-fem bypass 2003    left to right   . Right femoral bypass 2004  . Basal cell carcinoma excision 2004    REMOVAL FROM NOSE  . Arteriovenous graft placement right  11-30-2010,  05-20-2011  . Femoral-femoral bypass graft 06/12/2012    Procedure: BYPASS GRAFT FEMORAL-FEMORAL ARTERY;  Surgeon: Larina Earthly, MD;  Location: [facility] OR;  Service: Vascular;  Laterality: N/A;  Right to left femoral to femoral bypass    Patient Active Problem List  Diagnosis  . HYPERLIPIDEMIA-MIXED  . Mitral regurgitation  . Ejection fraction  . Dyslipidemia  . CAD (coronary artery disease)  . Hx of CABG  . ESRD (end stage renal disease)  . Diabetes mellitus  . Anemia  . PAD (peripheral artery disease)  . Aspirin allergy  . Hypertension  . Chronic systolic heart failure  . Back pain  . Cough  . Cardiomyopathy  . Carotid  artery disease  . Atrial fibrillation  . Encounter for long-term (current) use of anticoagulants  . Chronic total occlusion of artery of the extremities  . Disturbance of skin sensation  . Preop cardiovascular exam  . H/O amiodarone therapy  .  PVD (peripheral vascular disease)  . PVD (peripheral vascular disease) with claudication    ROS   Patient denies fever, chills, headache, sweats, rash, change in vision, change in hearing, chest pain, cough, nausea vomiting, urinary symptoms. All other systems are reviewed and are negative.  PHYSICAL EXAM  Patient is oriented to person time and place. She is with a family member. There is no jugulovenous distention. Lungs reveal a few scattered rhonchi. Cardiac exam reveals S1 and S2. There are no clicks or significant murmurs. The abdomen is soft. There is no peripheral edema. There is a good bruit in her right arm. She is frail but stable. There are no skin rashes.  Filed Vitals:   09/30/12 1421  BP: 161/63  Pulse: 67  Height: 5\' 3"  (1.6 m)  Weight: 123 lb (55.792 kg)   EKG is done today and reviewed by me. There is sinus rhythm.  ASSESSMENT & PLAN

## 2012-10-01 ENCOUNTER — Other Ambulatory Visit: Payer: Medicare Other

## 2012-10-07 ENCOUNTER — Encounter (INDEPENDENT_AMBULATORY_CARE_PROVIDER_SITE_OTHER): Payer: Medicare Other

## 2012-10-07 DIAGNOSIS — I6529 Occlusion and stenosis of unspecified carotid artery: Secondary | ICD-10-CM

## 2012-10-11 ENCOUNTER — Encounter: Payer: Self-pay | Admitting: Cardiology

## 2012-10-12 ENCOUNTER — Telehealth: Payer: Self-pay | Admitting: Cardiology

## 2012-10-12 ENCOUNTER — Encounter: Payer: Self-pay | Admitting: Vascular Surgery

## 2012-10-12 NOTE — Telephone Encounter (Signed)
Message copied by Burnice Logan on Mon Oct 12, 2012 11:37 AM ------      Message from: [place], [place] D      Created: Sun Oct 11, 2012 11:44 AM       Please let the patient know that her carotid Dopplers are stable.

## 2012-10-12 NOTE — Telephone Encounter (Signed)
Pt informed carotid is stable

## 2012-10-13 ENCOUNTER — Ambulatory Visit (INDEPENDENT_AMBULATORY_CARE_PROVIDER_SITE_OTHER): Payer: Medicare Other | Admitting: Vascular Surgery

## 2012-10-13 ENCOUNTER — Encounter: Payer: Self-pay | Admitting: Vascular Surgery

## 2012-10-13 VITALS — BP 123/70 | HR 79 | Resp 20 | Ht 63.0 in | Wt 123.0 lb

## 2012-10-13 DIAGNOSIS — I739 Peripheral vascular disease, unspecified: Secondary | ICD-10-CM

## 2012-10-13 DIAGNOSIS — Z48812 Encounter for surgical aftercare following surgery on the circulatory system: Secondary | ICD-10-CM

## 2012-10-13 NOTE — Progress Notes (Signed)
Ankle brachial index performed @ [facility] 10/13/2012

## 2012-10-13 NOTE — Progress Notes (Signed)
The patient has today for followup of her lower extremity arterial insufficiency. She had had a prior placed right axilla bypass in 2012 and most recently underwent a redo right to left femoral-femoral bypass in September of 2013. She had critical ischemia to her left foot with severe rest pain.  She did have some disability following brachial puncture probable hematoma in her brachial sheath. She has seen a hand therapy but continues to have diminished function particularly in her left index finger. He is continuing her exercise program and had been seen physical therapy.  She specifically denies any rest pain or claudication and has no difficulty walking.  Past Medical History  Diagnosis Date  . Mitral regurgitation     Mild-to-moderate, echo, January, 2010 / moderate, e eccentric jet, echo, March, 2012, moderate to moderately severe by bedside echocardiogram December 2012  . Ejection fraction     Ejection fraction 25-30% January, 2013  . Dyslipidemia   . CAD (coronary artery disease)     Nuclear, January, 2010, no ischemia  . Hx of CABG     2007  . Cardiomyopathy     Ischemic status post coronary bypass grafting  . Diabetes mellitus   . Anemia     Chronic  . PAD (peripheral artery disease)     Right axillofemoral bypass, Dr. Arbie Cookey status post redo right axillofemoral bypass August 2012  . Aspirin allergy     Takes Plavix  . Hypertension   . Carotid artery disease     Doppler, May, 2012, 50-69% R. ICA, 50-69% LICA  . Hyperlipidemia   . Arthritis   . Anxiety   . CHF (congestive heart failure) 03/23/12  . H/O amiodarone therapy     Started July, 2013  . PONV (postoperative nausea and vomiting)   . Atrial fibrillation     New onset July, 2013  . GERD (gastroesophageal reflux disease)   . ESRD (end stage renal disease) on dialysis     Dialysis, right arm shunt  / PCI tissue, interventional radiology September, 2010 / temporary catheter left subclavian, new AV fistula right arm  maturing March, 201 t2  . ESRD     dialysis Tu,Thur,Sat  . COPD (chronic obstructive pulmonary disease)     uses oxygen 2 liters 24/7  . Fatigue     Fatigue and sleepiness since hospitalization December, 2013    History  Substance Use Topics  . Smoking status: Former Smoker -- 2.0 packs/day for 50 years    Types: Cigarettes    Quit date: 09/23/2005  . Smokeless tobacco: Former Neurosurgeon    Quit date: 09/23/2005     Comment: SMOKED FROM AGE 13 UNTIL 2007  . Alcohol Use: No     Comment: NO ALCOHOL SINCE THE 70'S    Family History  Problem Relation Age of Onset  . Diabetes Mother   . Kidney disease Father     Allergies  Allergen Reactions  . Aspirin Anaphylaxis  . Penicillins Anaphylaxis  . Septra (Sulfamethoxazole W-Trimethoprim) Nausea And Vomiting  . Nsaids Itching    Current outpatient prescriptions:albuterol (PROVENTIL) (2.5 MG/3ML) 0.083% nebulizer solution, Take 2.5 mg by nebulization every 6 (six) hours as needed. For shortness of breath, Disp: , Rfl: ;  ALPRAZolam (XANAX) 0.5 MG tablet, Take 0.5 mg by mouth 3 (three) times daily as needed. For anxiety , Disp: , Rfl: ;  amiodarone (PACERONE) 200 MG tablet, Take 0.5 tablets (100 mg total) by mouth daily., Disp: 15 tablet, Rfl: 6 folic acid-vitamin b  complex-vitamin c-selenium-zinc (DIALYVITE) 3 MG TABS, Take 1 tablet by mouth daily.  , Disp: , Rfl: ;  furosemide (LASIX) 80 MG tablet, Take 80 mg by mouth 2 (two) times daily. 1.5 tablets in morning; 1 tablet noon., Disp: , Rfl: ;  HYDROcodone-acetaminophen (VICODIN) 5-500 MG per tablet, Take 1 tablet by mouth every 8 (eight) hours as needed., Disp: , Rfl:  insulin NPH (HUMULIN N,NOVOLIN N) 100 UNIT/ML injection, Inject 10-40 Units into the skin 2 (two) times daily. 40 units is the morning and 10 units every evening, Disp: , Rfl: ;  isosorbide dinitrate (ISORDIL) 20 MG tablet, Take 20 mg by mouth 2 (two) times daily., Disp: , Rfl: ;  lidocaine-prilocaine (EMLA) cream, Apply 1  application topically once. Prior to dialysis on Tues, Thurs, and Sat., Disp: , Rfl:  omeprazole (PRILOSEC) 20 MG capsule, Take 20 mg by mouth Daily., Disp: , Rfl: ;  pravastatin (PRAVACHOL) 40 MG tablet, Take 40 mg by mouth daily., Disp: , Rfl: ;  sevelamer (RENVELA) 800 MG tablet, Take 1,600-2,400 mg by mouth See admin instructions. Take 3 tablets with meals and 2 tablets with snacks, Disp: , Rfl: ;  warfarin (COUMADIN) 2 MG tablet, Take 1 tablet (2 mg total) by mouth daily., Disp: 30 tablet, Rfl: 3  BP 123/70  Pulse 79  Resp 20  Ht 5\' 3"  (1.6 m)  Wt 123 lb (55.792 kg)  BMI 21.79 kg/m2  LMP 08/06/1986  Body mass index is 21.79 kg/(m^2).       Frail-appearing white female no acute distress She does have a palpable left brachial and left radial pulse. She does have excellent the right upper arm AV fistula She has a 2+ right axillary femoral and 2+ femoral to femoral bypass pulse with no evidence of wound problems in her groins. She has absent popliteal and distal pulses with known SFA and distal disease  Non-invasive vascular lab studies studies today show no significant change. Her ankle index is 0.39 on the right and 0.53 on the left with monophasic posterior tibial and dorsalis pedis signals bilaterally  Impression and plan stable with patent right axilla and fem-fem bypass. No rest pain or claudication. She will continue her walking program. We will not follow her actively of the vascular lab and she will notify should she develop worsening symptoms or tissue loss

## 2012-10-14 ENCOUNTER — Other Ambulatory Visit: Payer: Medicare Other

## 2012-10-22 ENCOUNTER — Other Ambulatory Visit (INDEPENDENT_AMBULATORY_CARE_PROVIDER_SITE_OTHER): Payer: Medicare Other

## 2012-10-22 ENCOUNTER — Ambulatory Visit (INDEPENDENT_AMBULATORY_CARE_PROVIDER_SITE_OTHER): Payer: Medicare Other | Admitting: *Deleted

## 2012-10-22 ENCOUNTER — Other Ambulatory Visit: Payer: Self-pay

## 2012-10-22 DIAGNOSIS — I4891 Unspecified atrial fibrillation: Secondary | ICD-10-CM

## 2012-10-22 DIAGNOSIS — I5022 Chronic systolic (congestive) heart failure: Secondary | ICD-10-CM

## 2012-10-22 DIAGNOSIS — Z7901 Long term (current) use of anticoagulants: Secondary | ICD-10-CM

## 2012-10-22 DIAGNOSIS — I059 Rheumatic mitral valve disease, unspecified: Secondary | ICD-10-CM

## 2012-10-22 DIAGNOSIS — I2510 Atherosclerotic heart disease of native coronary artery without angina pectoris: Secondary | ICD-10-CM

## 2012-10-22 LAB — POCT INR: INR: 2.1

## 2012-10-23 ENCOUNTER — Encounter: Payer: Self-pay | Admitting: Cardiology

## 2012-10-26 ENCOUNTER — Telehealth: Payer: Self-pay | Admitting: *Deleted

## 2012-10-26 NOTE — Telephone Encounter (Signed)
Message copied by Eustace Moore on Mon Oct 26, 2012  9:46 AM ------      Message from: Myrtis Ser, [place] D      Created: Fri Oct 23, 2012  3:53 PM       TSH is normal.

## 2012-10-26 NOTE — Telephone Encounter (Signed)
Message copied by Eustace Moore on Mon Oct 26, 2012  9:45 AM ------      Message from: [place], [place] D      Created: Fri Oct 23, 2012  3:41 PM       Please let her know that motion of her heart is similar to the past. No significant change.

## 2012-10-26 NOTE — Telephone Encounter (Signed)
Patient informed. 

## 2012-11-10 ENCOUNTER — Ambulatory Visit (INDEPENDENT_AMBULATORY_CARE_PROVIDER_SITE_OTHER): Payer: Medicare Other | Admitting: *Deleted

## 2012-11-10 DIAGNOSIS — I4891 Unspecified atrial fibrillation: Secondary | ICD-10-CM

## 2012-11-10 DIAGNOSIS — Z7901 Long term (current) use of anticoagulants: Secondary | ICD-10-CM

## 2012-11-10 DIAGNOSIS — I5022 Chronic systolic (congestive) heart failure: Secondary | ICD-10-CM

## 2012-11-30 ENCOUNTER — Ambulatory Visit: Payer: Medicare Other | Admitting: Cardiology

## 2012-12-01 ENCOUNTER — Ambulatory Visit (INDEPENDENT_AMBULATORY_CARE_PROVIDER_SITE_OTHER): Payer: Medicare Other | Admitting: *Deleted

## 2012-12-01 DIAGNOSIS — I4891 Unspecified atrial fibrillation: Secondary | ICD-10-CM

## 2012-12-01 DIAGNOSIS — I5022 Chronic systolic (congestive) heart failure: Secondary | ICD-10-CM

## 2012-12-01 DIAGNOSIS — Z7901 Long term (current) use of anticoagulants: Secondary | ICD-10-CM

## 2012-12-01 LAB — POCT INR: INR: 2.2

## 2012-12-02 ENCOUNTER — Other Ambulatory Visit: Payer: Self-pay | Admitting: Cardiology

## 2012-12-02 MED ORDER — PRAVASTATIN SODIUM 40 MG PO TABS: 40.0000 mg | ORAL_TABLET | Freq: Every day | ORAL | Status: DC

## 2012-12-08 DIAGNOSIS — I2589 Other forms of chronic ischemic heart disease: Secondary | ICD-10-CM

## 2012-12-08 DIAGNOSIS — I5023 Acute on chronic systolic (congestive) heart failure: Secondary | ICD-10-CM

## 2012-12-09 DIAGNOSIS — I509 Heart failure, unspecified: Secondary | ICD-10-CM

## 2012-12-16 ENCOUNTER — Telehealth: Payer: Self-pay | Admitting: *Deleted

## 2012-12-16 NOTE — Telephone Encounter (Signed)
Pt was started on Macrobid.  Since INR 1.8 today.  Stay on same dose of coumadin and keep INR appt from 12/22/12.  Pt verbalized understanding.

## 2012-12-16 NOTE — Telephone Encounter (Signed)
Mrs. Tolley called today stating she went to see Dr. Dimas Aguas today. He put her on an antiobotic. Also, her INR was checked today 1.8. Dr. Dimas Aguas told her to let our Coumdin clinic know and To keep her appointment on 12/22/12

## 2012-12-22 ENCOUNTER — Ambulatory Visit (INDEPENDENT_AMBULATORY_CARE_PROVIDER_SITE_OTHER): Payer: Medicare Other | Admitting: *Deleted

## 2012-12-22 DIAGNOSIS — I5022 Chronic systolic (congestive) heart failure: Secondary | ICD-10-CM

## 2012-12-22 DIAGNOSIS — I4891 Unspecified atrial fibrillation: Secondary | ICD-10-CM

## 2012-12-22 DIAGNOSIS — Z7901 Long term (current) use of anticoagulants: Secondary | ICD-10-CM

## 2012-12-22 LAB — POCT INR: INR: 2

## 2012-12-28 ENCOUNTER — Telehealth: Payer: Self-pay | Admitting: Cardiology

## 2012-12-28 ENCOUNTER — Ambulatory Visit (INDEPENDENT_AMBULATORY_CARE_PROVIDER_SITE_OTHER): Payer: Medicare Other | Admitting: *Deleted

## 2012-12-28 DIAGNOSIS — I5022 Chronic systolic (congestive) heart failure: Secondary | ICD-10-CM

## 2012-12-28 DIAGNOSIS — I4891 Unspecified atrial fibrillation: Secondary | ICD-10-CM

## 2012-12-28 DIAGNOSIS — Z7901 Long term (current) use of anticoagulants: Secondary | ICD-10-CM

## 2012-12-28 LAB — POCT INR: INR: 2.5

## 2012-12-28 NOTE — Telephone Encounter (Signed)
See coumadin note. 

## 2012-12-28 NOTE — Telephone Encounter (Signed)
INR - 2.5 / tgs

## 2012-12-31 ENCOUNTER — Ambulatory Visit: Payer: Medicare Other | Admitting: Cardiology

## 2013-01-01 ENCOUNTER — Ambulatory Visit (INDEPENDENT_AMBULATORY_CARE_PROVIDER_SITE_OTHER): Payer: Medicare Other | Admitting: Cardiology

## 2013-01-01 ENCOUNTER — Encounter: Payer: Self-pay | Admitting: Cardiology

## 2013-01-01 VITALS — BP 144/61 | HR 81 | Ht 63.0 in | Wt 120.0 lb

## 2013-01-01 DIAGNOSIS — I2510 Atherosclerotic heart disease of native coronary artery without angina pectoris: Secondary | ICD-10-CM

## 2013-01-01 DIAGNOSIS — R9430 Abnormal result of cardiovascular function study, unspecified: Secondary | ICD-10-CM

## 2013-01-01 DIAGNOSIS — Z9229 Personal history of other drug therapy: Secondary | ICD-10-CM

## 2013-01-01 DIAGNOSIS — Z7901 Long term (current) use of anticoagulants: Secondary | ICD-10-CM

## 2013-01-01 DIAGNOSIS — IMO0002 Reserved for concepts with insufficient information to code with codable children: Secondary | ICD-10-CM

## 2013-01-01 DIAGNOSIS — N186 End stage renal disease: Secondary | ICD-10-CM

## 2013-01-01 DIAGNOSIS — I059 Rheumatic mitral valve disease, unspecified: Secondary | ICD-10-CM

## 2013-01-01 DIAGNOSIS — R0989 Other specified symptoms and signs involving the circulatory and respiratory systems: Secondary | ICD-10-CM

## 2013-01-01 DIAGNOSIS — I779 Disorder of arteries and arterioles, unspecified: Secondary | ICD-10-CM

## 2013-01-01 DIAGNOSIS — I5022 Chronic systolic (congestive) heart failure: Secondary | ICD-10-CM

## 2013-01-01 DIAGNOSIS — I340 Nonrheumatic mitral (valve) insufficiency: Secondary | ICD-10-CM

## 2013-01-01 DIAGNOSIS — E785 Hyperlipidemia, unspecified: Secondary | ICD-10-CM

## 2013-01-01 DIAGNOSIS — I739 Peripheral vascular disease, unspecified: Secondary | ICD-10-CM

## 2013-01-01 DIAGNOSIS — I4891 Unspecified atrial fibrillation: Secondary | ICD-10-CM

## 2013-01-01 NOTE — Assessment & Plan Note (Signed)
The patient has moderate mitral regurgitation by echo January, 2014. There is no further therapy that we can offer at this time.

## 2013-01-01 NOTE — Assessment & Plan Note (Signed)
The patient's ejection fraction remains reduced. It was 25-30% by echo in January, 2014. She is on the only medicines that she can tolerate.

## 2013-01-01 NOTE — Assessment & Plan Note (Signed)
Dialysis and very careful attention to her salt and fluid as the best treatment we can offer her.

## 2013-01-01 NOTE — Assessment & Plan Note (Signed)
The patient is on a statin. No change in therapy.

## 2013-01-01 NOTE — Progress Notes (Signed)
HPI  Patient is seen today to followup ischemic cardiomyopathy and CHF and atrial fibrillation. Unfortunately she had an admission for CHF in March, 2014. It was a brief admission. She has had significant stress the day before admission. In addition she had eaten some ham. This is not common for her. She had acute worsening of her breathing and was taken to the hospital. She was treated with BiPAP and then improved. With dialysis she stabilized.  When I saw her back in January, 2014, TSH was checked to be sure it was in the normal range. It was normal. We have lowered her amiodarone dose all the way to 100 mg daily. She is holding sinus rhythm with this.  Allergies  Allergen Reactions  . Aspirin Anaphylaxis  . Penicillins Anaphylaxis  . Septra (Sulfamethoxazole W-Trimethoprim) Nausea And Vomiting  . Nsaids Itching    Current Outpatient Prescriptions  Medication Sig Dispense Refill  . albuterol (PROVENTIL) (2.5 MG/3ML) 0.083% nebulizer solution Take 2.5 mg by nebulization every 6 (six) hours as needed. For shortness of breath      . ALPRAZolam (XANAX) 0.5 MG tablet Take 0.5 mg by mouth 3 (three) times daily as needed. For anxiety       . amiodarone (PACERONE) 200 MG tablet Take 0.5 tablets (100 mg total) by mouth daily.  15 tablet  6  . folic acid-vitamin b complex-vitamin c-selenium-zinc (DIALYVITE) 3 MG TABS Take 1 tablet by mouth daily.        . furosemide (LASIX) 80 MG tablet Take 80 mg by mouth 2 (two) times daily. 1.5 tablets in morning; 1 tablet noon.      Marland Kitchen HYDROcodone-acetaminophen (NORCO) 7.5-325 MG per tablet Take 1 tablet by mouth every 6 (six) hours as needed for pain.      Marland Kitchen insulin NPH (HUMULIN N,NOVOLIN N) 100 UNIT/ML injection Inject 10-40 Units into the skin 2 (two) times daily. 40 units is the morning and 10 units every evening      . isosorbide dinitrate (ISORDIL) 20 MG tablet Take 20 mg by mouth 2 (two) times daily.      Marland Kitchen lidocaine-prilocaine (EMLA) cream Apply 1  application topically once. Prior to dialysis on Tues, Thurs, and Sat.      Marland Kitchen omeprazole (PRILOSEC) 20 MG capsule Take 20 mg by mouth Daily.      . pravastatin (PRAVACHOL) 40 MG tablet Take 1 tablet (40 mg total) by mouth daily.  30 tablet  2  . sevelamer (RENVELA) 800 MG tablet Take 1,600-2,400 mg by mouth See admin instructions. Take 3 tablets with meals and 2 tablets with snacks      . warfarin (COUMADIN) 2 MG tablet Take 1 tablet (2 mg total) by mouth daily.  30 tablet  3   No current facility-administered medications for this visit.    History   Social History  . Marital Status: Widowed    Spouse Name: RALPH    Number of Children: N/A  . Years of Education: N/A   Occupational History  . RETIRED     [employer]   Social History Main Topics  . Smoking status: Former Smoker -- 2.00 packs/day for 50 years    Types: Cigarettes    Quit date: 09/23/2005  . Smokeless tobacco: Former Neurosurgeon    Quit date: 09/23/2005     Comment: SMOKED FROM AGE 73 UNTIL 2007  . Alcohol Use: No     Comment: NO ALCOHOL SINCE THE 70'S  . Drug Use: No  .  Sexually Active: Not on file   Other Topics Concern  . Not on file   Social History Narrative  . No narrative on file    Family History  Problem Relation Age of Onset  . Diabetes Mother   . Kidney disease Father     Past Medical History  Diagnosis Date  . Mitral regurgitation     Mild-to-moderate, echo, January, 2010 / moderate, e eccentric jet, echo, March, 2012, moderate to moderately severe by bedside echocardiogram December 2012  . Ejection fraction     Ejection fraction 25-30% January, 2013  . Dyslipidemia   . CAD (coronary artery disease)     Nuclear, January, 2010, no ischemia  . Hx of CABG     2007  . Cardiomyopathy     Ischemic status post coronary bypass grafting  . Diabetes mellitus   . Anemia     Chronic  . PAD (peripheral artery disease)     Right axillofemoral bypass, Dr. Arbie Cookey status post redo  right axillofemoral bypass August 2012  . Aspirin allergy     Takes Plavix  . Hypertension   . Carotid artery disease     Doppler, May, 2012, 50-69% R. ICA, 50-69% LICA  . Hyperlipidemia   . Arthritis   . Anxiety   . CHF (congestive heart failure) 03/23/12  . H/O amiodarone therapy     Started July, 2013  . PONV (postoperative nausea and vomiting)   . Atrial fibrillation     New onset July, 2013  . GERD (gastroesophageal reflux disease)   . ESRD (end stage renal disease) on dialysis     Dialysis, right arm shunt  / PCI tissue, interventional radiology September, 2010 / temporary catheter left subclavian, new AV fistula right arm maturing March, 201 t2  . ESRD     dialysis Tu,Thur,Sat  . COPD (chronic obstructive pulmonary disease)     uses oxygen 2 liters 24/7  . Fatigue     Fatigue and sleepiness since hospitalization December, 2013    Past Surgical History  Procedure Laterality Date  . Coronary artery bypass graft   07/27/2006    [name]. [name], M.D  . Abdominal hysterectomy    . Laparoscopic salpingoopherectomy    . Appendectomy    . Fem-fem bypass  2003    left to right   . Right femoral bypass  2004  . Basal cell carcinoma excision  2004    REMOVAL FROM NOSE  . Arteriovenous graft placement  right  11-30-2010,  05-20-2011  . Femoral-femoral bypass graft  06/12/2012    Procedure: BYPASS GRAFT FEMORAL-FEMORAL ARTERY;  Surgeon: Larina Earthly, MD;  Location: [facility] OR;  Service: Vascular;  Laterality: N/A;  Right to left femoral to femoral bypass    Patient Active Problem List  Diagnosis  . HYPERLIPIDEMIA-MIXED  . Mitral regurgitation  . Ejection fraction  . Dyslipidemia  . CAD (coronary artery disease)  . Hx of CABG  . ESRD (end stage renal disease)  . Diabetes mellitus  . Anemia  . PAD (peripheral artery disease)  . Aspirin allergy  . Hypertension  . Chronic systolic heart failure  . Back pain  . Cough  . Cardiomyopathy  . Carotid artery disease  .  Atrial fibrillation  . Encounter for long-term (current) use of anticoagulants  . Chronic total occlusion of artery of the extremities  . Disturbance of skin sensation  . H/O amiodarone therapy  . Fatigue    ROS   Patient denies  fever, chills, headache, sweats, rash, change in vision, change in hearing, chest pain, cough, nausea vomiting, urinary symptoms. All other systems are reviewed and are negative.  PHYSICAL EXAM  Patient is here with her daughter-in-law. She is oriented to person time and place. Affect is normal. She is thin but she does not appear to be frail today. There is no jugular venous distention. She has carotid bruits. She has kyphosis of the spine. Lungs reveal a few scattered rhonchi. Cardiac exam reveals S1 and S2. There is a systolic murmur. The abdomen is soft. There is no peripheral edema.  Filed Vitals:   01/01/13 1355  BP: 144/61  Pulse: 81  Height: 5\' 3"  (1.6 m)  Weight: 120 lb (54.432 kg)   EKG is done today and reviewed by me. There is sinus rhythm with rare PVCs.  ASSESSMENT & PLAN

## 2013-01-01 NOTE — Assessment & Plan Note (Signed)
Amiodarone is being continued at a very low dose. We have been following her labs. She does not need lab at this time.

## 2013-01-01 NOTE — Assessment & Plan Note (Signed)
Her dialysis appears to be going relatively well at this time.

## 2013-01-01 NOTE — Assessment & Plan Note (Signed)
The patient had her last peripheral arterial surgery in 2013. This helped her symptoms significantly.

## 2013-01-01 NOTE — Assessment & Plan Note (Signed)
Her carotid disease is stable. The last study was done January, 2014 and it is stable. Followup in one year.

## 2013-01-01 NOTE — Assessment & Plan Note (Signed)
She is tolerating Coumadin for her atrial fib.

## 2013-01-01 NOTE — Assessment & Plan Note (Signed)
She's not having any definite signs of ischemia at this time. Her last nuclear study was in 2010. I always consider whether these episodes of pulmonary edema could represent ischemia. However she's not been having troponin elevations. She's not having any chest pain.

## 2013-01-01 NOTE — Patient Instructions (Signed)
Continue all current medications. Your physician wants you to follow up in:  4 months.  You will receive a reminder letter in the mail one-two months in advance.  If you don't receive a letter, please call our office to schedule the follow up appointment   

## 2013-01-01 NOTE — Assessment & Plan Note (Signed)
Her recent episode of CHF was not related to atrial fibrillation. She is holding sinus rhythm on 100 mg of amiodarone

## 2013-01-04 ENCOUNTER — Encounter: Payer: Self-pay | Admitting: Cardiology

## 2013-01-05 ENCOUNTER — Encounter: Payer: Self-pay | Admitting: Cardiology

## 2013-01-05 ENCOUNTER — Telehealth: Payer: Self-pay | Admitting: *Deleted

## 2013-01-05 NOTE — Telephone Encounter (Signed)
Patient called to informed MD that she did have a doppler on her toe and leg October 13, 2012 at Dr. Bosie Helper office.

## 2013-01-11 ENCOUNTER — Encounter: Payer: Self-pay | Admitting: *Deleted

## 2013-01-11 ENCOUNTER — Ambulatory Visit (INDEPENDENT_AMBULATORY_CARE_PROVIDER_SITE_OTHER): Payer: Medicare Other | Admitting: *Deleted

## 2013-01-11 DIAGNOSIS — I4891 Unspecified atrial fibrillation: Secondary | ICD-10-CM

## 2013-01-11 DIAGNOSIS — Z7901 Long term (current) use of anticoagulants: Secondary | ICD-10-CM

## 2013-01-11 DIAGNOSIS — I5022 Chronic systolic (congestive) heart failure: Secondary | ICD-10-CM

## 2013-01-11 LAB — POCT INR: INR: 2.4

## 2013-01-14 ENCOUNTER — Ambulatory Visit (INDEPENDENT_AMBULATORY_CARE_PROVIDER_SITE_OTHER): Payer: Medicare Other | Admitting: *Deleted

## 2013-01-14 DIAGNOSIS — I5022 Chronic systolic (congestive) heart failure: Secondary | ICD-10-CM

## 2013-01-14 DIAGNOSIS — Z7901 Long term (current) use of anticoagulants: Secondary | ICD-10-CM

## 2013-01-14 DIAGNOSIS — I4891 Unspecified atrial fibrillation: Secondary | ICD-10-CM

## 2013-01-14 LAB — POCT INR: INR: 1.9

## 2013-01-26 ENCOUNTER — Other Ambulatory Visit: Payer: Self-pay | Admitting: *Deleted

## 2013-01-26 MED ORDER — PRAVASTATIN SODIUM 40 MG PO TABS: 40.0000 mg | ORAL_TABLET | Freq: Every day | ORAL | Status: DC

## 2013-01-29 ENCOUNTER — Ambulatory Visit (INDEPENDENT_AMBULATORY_CARE_PROVIDER_SITE_OTHER): Payer: Medicare Other | Admitting: *Deleted

## 2013-01-29 DIAGNOSIS — Z7901 Long term (current) use of anticoagulants: Secondary | ICD-10-CM

## 2013-01-29 DIAGNOSIS — I4891 Unspecified atrial fibrillation: Secondary | ICD-10-CM

## 2013-01-29 DIAGNOSIS — I5022 Chronic systolic (congestive) heart failure: Secondary | ICD-10-CM

## 2013-01-29 LAB — POCT INR: INR: 1.5

## 2013-01-29 MED ORDER — WARFARIN SODIUM 2 MG PO TABS: ORAL_TABLET | ORAL | Status: DC

## 2013-02-05 ENCOUNTER — Ambulatory Visit (INDEPENDENT_AMBULATORY_CARE_PROVIDER_SITE_OTHER): Payer: Medicare Other | Admitting: *Deleted

## 2013-02-05 DIAGNOSIS — I4891 Unspecified atrial fibrillation: Secondary | ICD-10-CM

## 2013-02-05 DIAGNOSIS — Z7901 Long term (current) use of anticoagulants: Secondary | ICD-10-CM

## 2013-02-05 DIAGNOSIS — I5022 Chronic systolic (congestive) heart failure: Secondary | ICD-10-CM

## 2013-02-05 LAB — POCT INR: INR: 1.7

## 2013-02-12 ENCOUNTER — Ambulatory Visit (INDEPENDENT_AMBULATORY_CARE_PROVIDER_SITE_OTHER): Payer: Medicare Other | Admitting: *Deleted

## 2013-02-12 DIAGNOSIS — Z7901 Long term (current) use of anticoagulants: Secondary | ICD-10-CM

## 2013-02-12 DIAGNOSIS — I4891 Unspecified atrial fibrillation: Secondary | ICD-10-CM

## 2013-02-12 DIAGNOSIS — I5022 Chronic systolic (congestive) heart failure: Secondary | ICD-10-CM

## 2013-02-12 LAB — POCT INR: INR: 3

## 2013-02-26 ENCOUNTER — Ambulatory Visit (INDEPENDENT_AMBULATORY_CARE_PROVIDER_SITE_OTHER): Payer: Medicare Other | Admitting: *Deleted

## 2013-02-26 DIAGNOSIS — Z7901 Long term (current) use of anticoagulants: Secondary | ICD-10-CM

## 2013-02-26 DIAGNOSIS — I4891 Unspecified atrial fibrillation: Secondary | ICD-10-CM

## 2013-02-26 DIAGNOSIS — I5022 Chronic systolic (congestive) heart failure: Secondary | ICD-10-CM

## 2013-02-26 LAB — POCT INR: INR: 2

## 2013-03-16 ENCOUNTER — Ambulatory Visit (INDEPENDENT_AMBULATORY_CARE_PROVIDER_SITE_OTHER): Payer: Medicare Other | Admitting: *Deleted

## 2013-03-16 DIAGNOSIS — Z7901 Long term (current) use of anticoagulants: Secondary | ICD-10-CM

## 2013-03-16 DIAGNOSIS — I4891 Unspecified atrial fibrillation: Secondary | ICD-10-CM

## 2013-03-16 DIAGNOSIS — I5022 Chronic systolic (congestive) heart failure: Secondary | ICD-10-CM

## 2013-04-09 ENCOUNTER — Ambulatory Visit (INDEPENDENT_AMBULATORY_CARE_PROVIDER_SITE_OTHER): Payer: Medicare Other | Admitting: *Deleted

## 2013-04-09 DIAGNOSIS — Z7901 Long term (current) use of anticoagulants: Secondary | ICD-10-CM

## 2013-04-09 DIAGNOSIS — I4891 Unspecified atrial fibrillation: Secondary | ICD-10-CM

## 2013-04-09 DIAGNOSIS — I5022 Chronic systolic (congestive) heart failure: Secondary | ICD-10-CM

## 2013-04-09 LAB — POCT INR: INR: 1.9

## 2013-05-04 ENCOUNTER — Ambulatory Visit (INDEPENDENT_AMBULATORY_CARE_PROVIDER_SITE_OTHER): Payer: Medicare Other | Admitting: *Deleted

## 2013-05-04 DIAGNOSIS — Z7901 Long term (current) use of anticoagulants: Secondary | ICD-10-CM

## 2013-05-04 DIAGNOSIS — I5022 Chronic systolic (congestive) heart failure: Secondary | ICD-10-CM

## 2013-05-04 DIAGNOSIS — I4891 Unspecified atrial fibrillation: Secondary | ICD-10-CM

## 2013-05-04 LAB — POCT INR: INR: 4

## 2013-05-10 ENCOUNTER — Telehealth: Payer: Self-pay

## 2013-05-10 DIAGNOSIS — I70229 Atherosclerosis of native arteries of extremities with rest pain, unspecified extremity: Secondary | ICD-10-CM

## 2013-05-10 NOTE — Telephone Encounter (Signed)
Pt. Called to report she has had recent increased pain in right foot x 1 week.  States has redness in 2nd and 3rd toes of right foot, and when she steps on her right foot, "it feels like I'm stepping on mush."  Denies any open sores of right lower extremity/ right foot.  States she went to Dr. Chaney Malling, and was told she has no pulse in right foot, and should go back to see Dr. Arbie Cookey.  Advised pt. Will call back to schedule appt. To be evaluated by Dr. Arbie Cookey.

## 2013-05-11 ENCOUNTER — Encounter: Payer: Self-pay | Admitting: Vascular Surgery

## 2013-05-11 ENCOUNTER — Ambulatory Visit (INDEPENDENT_AMBULATORY_CARE_PROVIDER_SITE_OTHER): Payer: Medicare Other | Admitting: Vascular Surgery

## 2013-05-11 ENCOUNTER — Encounter (INDEPENDENT_AMBULATORY_CARE_PROVIDER_SITE_OTHER): Payer: Medicare Other | Admitting: *Deleted

## 2013-05-11 VITALS — BP 136/66 | HR 84 | Resp 16 | Ht 63.0 in | Wt 123.0 lb

## 2013-05-11 DIAGNOSIS — I70229 Atherosclerosis of native arteries of extremities with rest pain, unspecified extremity: Secondary | ICD-10-CM

## 2013-05-11 DIAGNOSIS — I739 Peripheral vascular disease, unspecified: Secondary | ICD-10-CM

## 2013-05-11 MED ORDER — CIPROFLOXACIN HCL 500 MG PO TABS: 500.0000 mg | ORAL_TABLET | Freq: Two times a day (BID) | ORAL | Status: AC

## 2013-05-11 NOTE — Progress Notes (Signed)
[facility] [place]  Established Previous Bypass  History of Present Illness  [name] is a 72 y.o. (08-Sep-1941) female who presents with chief complaint: right second and third toe pain with erythema over the dorsum of the forefoot.  She states she has rest pain and ambulatory pain as well for the last week. Previous operation(s) complete onright axilla bypass in 2012 and most recently underwent a redo right to left femoral-femoral bypass in September of 2013. She had critical ischemia to her left foot with severe rest pain.    The patient's PMH, PSH, SH, FamHx, Med, and Allergies are unchanged from 10/16/2012.  On ROS today: neg for fever,chills,V/D, On home oxygen without changes-COPD,CKF T-TH-Sat dialysis.  Physical Examination  Filed Vitals:   05/11/13 1421  BP: 136/66  Pulse: 84  Resp: 16  Height: 5\' 3"  (1.6 m)  Weight: 123 lb (55.792 kg)   Body mass index is 21.79 kg/(m^2).  Physical Examination  Filed Vitals:   05/11/13 1421  BP: 136/66  Pulse: 84  Resp: 16  Height: 5\' 3"  (1.6 m)  Weight: 123 lb (55.792 kg)   Body mass index is 21.79 kg/(m^2).  General: A&O x 3, WD,  Pulmonary: Sym exp, good air movt, CTAB, no rales, rhonchi,  + rales, ,+ wheezing,   Cardiac:  Irregularly, irregular rhythm and rate,  Vascular: Vessel Right Left  Radial Palpable Palpable  Brachial Palpable Palpable  Carotid Palpable, without bruit Palpable, without bruit  Aorta Not palpable N/A  Femoral Palpable Palpable  Popliteal Not palpable Not palpable  PT Not Palpable Palpable  DP  notPalpable Palpable   Gastrointestinal: soft, NTND,  Musculoskeletal: M/S 5/5 throughout Extremities without ischemic changes right forefoot second third toes no open ulcers. Neurologic: Pain and light touch intact in extremities  except . Motor exam as listed above  Non-Invasive Vascular Imaging ABI (Date: 05/11/2013) R: 0.97  L: 0.89   Medical Decision Making  [name] is a 72 y.o. female who presents with: right foot ischemic changes and rest pain s/p  .complete onright axilla bypass in 2012 and most recently underwent a redo right to left femoral-femoral bypass in September of 2013  Based on her vascular studies today her ABI's are improved on the left and false high on the right in the LE. We will treat the cellulitis with Cipro 500 mg BID for 2 weeks.  She will follow up with Dr. Arbie [name] in 1 week for a foot check.  If she has skin changes such as ulcers or increased pain he will discuss possible fem-pop by pass surgery at that time.  [name] [facility] PA-C   [facility] [place] Office: [phone]   05/11/2013, 2:52 PM    I have examined the patient, reviewed and agree with above. A complex patient with long history of severe diffuse arterial and insufficiency. She may require right femoral to popliteal bypass for rest pain in her foot. We will try one-week trial of antibiotics due to the erythema and see her again in close followup. Her accident fem-fem bypass are patent. 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

## 2013-05-17 ENCOUNTER — Encounter: Payer: Self-pay | Admitting: Vascular Surgery

## 2013-05-18 ENCOUNTER — Ambulatory Visit (INDEPENDENT_AMBULATORY_CARE_PROVIDER_SITE_OTHER): Payer: Medicare Other | Admitting: Vascular Surgery

## 2013-05-18 ENCOUNTER — Ambulatory Visit (INDEPENDENT_AMBULATORY_CARE_PROVIDER_SITE_OTHER): Payer: Medicare Other | Admitting: *Deleted

## 2013-05-18 ENCOUNTER — Encounter: Payer: Self-pay | Admitting: Vascular Surgery

## 2013-05-18 VITALS — BP 163/53 | HR 43 | Temp 98.1°F | Ht 63.0 in | Wt 124.8 lb

## 2013-05-18 DIAGNOSIS — I5022 Chronic systolic (congestive) heart failure: Secondary | ICD-10-CM

## 2013-05-18 DIAGNOSIS — Z7901 Long term (current) use of anticoagulants: Secondary | ICD-10-CM

## 2013-05-18 DIAGNOSIS — I4891 Unspecified atrial fibrillation: Secondary | ICD-10-CM

## 2013-05-18 DIAGNOSIS — I739 Peripheral vascular disease, unspecified: Secondary | ICD-10-CM

## 2013-05-18 NOTE — Progress Notes (Signed)
The patient presents today for continued followup of her right foot. I saw her one week ago and she had a new episode of erythema over the dorsum of her third and fourth tenderness. She has significant pain associated with this tube had no open wounds. She was placed on Cipro and is seen today in one week for followup to assure that this is progressing satisfactorily.  Physical exam there is less erythema over the dorsum of her foot. The remainder of her foot is warm. There is no fluctuance. She has a reduced tenderness over this area.  Impression and plan known superficial artery occlusive disease with the improvement in her right foot erythema. We'll continue observation. Plan see her again in one month. She will notify us should she have increasing pain or tissue loss

## 2013-05-25 ENCOUNTER — Ambulatory Visit (INDEPENDENT_AMBULATORY_CARE_PROVIDER_SITE_OTHER): Payer: Medicare Other | Admitting: *Deleted

## 2013-05-25 DIAGNOSIS — I4891 Unspecified atrial fibrillation: Secondary | ICD-10-CM

## 2013-05-25 DIAGNOSIS — Z7901 Long term (current) use of anticoagulants: Secondary | ICD-10-CM

## 2013-05-25 DIAGNOSIS — I5022 Chronic systolic (congestive) heart failure: Secondary | ICD-10-CM

## 2013-06-09 ENCOUNTER — Other Ambulatory Visit: Payer: Self-pay | Admitting: Cardiology

## 2013-06-15 ENCOUNTER — Ambulatory Visit (INDEPENDENT_AMBULATORY_CARE_PROVIDER_SITE_OTHER): Payer: Medicare Other | Admitting: *Deleted

## 2013-06-15 DIAGNOSIS — Z7901 Long term (current) use of anticoagulants: Secondary | ICD-10-CM

## 2013-06-15 DIAGNOSIS — I5022 Chronic systolic (congestive) heart failure: Secondary | ICD-10-CM

## 2013-06-15 DIAGNOSIS — I4891 Unspecified atrial fibrillation: Secondary | ICD-10-CM

## 2013-06-21 ENCOUNTER — Encounter: Payer: Self-pay | Admitting: Vascular Surgery

## 2013-06-22 ENCOUNTER — Ambulatory Visit (INDEPENDENT_AMBULATORY_CARE_PROVIDER_SITE_OTHER): Payer: Medicare Other | Admitting: Vascular Surgery

## 2013-06-22 ENCOUNTER — Encounter: Payer: Self-pay | Admitting: Vascular Surgery

## 2013-06-22 VITALS — BP 155/71 | HR 88 | Ht 63.0 in | Wt 124.2 lb

## 2013-06-22 DIAGNOSIS — I739 Peripheral vascular disease, unspecified: Secondary | ICD-10-CM

## 2013-06-22 NOTE — Progress Notes (Signed)
The patient presents today for followup of her diffuse peripheral vascular occlusive disease. We have followed her for the last several months with low-grade infection and erythema in her right foot. She does have a known occlusion of her right suprasternal artery. She has a patent right accident and fem-fem bypass. Portion she has continued to have complete resolution of her erythema in her foot and resolution of pain. Her main complaint now is of severe hypotension with hemodialysis and feeling quite poorly thereafter. She will continue to have this suggested at hemodialysis.  Past Medical History  Diagnosis Date  . Mitral regurgitation     Mild-to-moderate, echo, January, 2010 / moderate, e eccentric jet, echo, March, 2012, moderate to moderately severe by bedside echocardiogram December 2012  . Ejection fraction     Ejection fraction 25-30% January, 2013  . Dyslipidemia   . CAD (coronary artery disease)     Nuclear, January, 2010, no ischemia  . Hx of CABG     2007  . Cardiomyopathy     Ischemic status post coronary bypass grafting  . Diabetes mellitus   . Anemia     Chronic  . PAD (peripheral artery disease)     Right axillofemoral bypass, Dr. Arbie Cookey status post redo right axillofemoral bypass August 2012  . Aspirin allergy     Takes Plavix  . Hypertension   . Carotid artery disease     Doppler, May, 2012, 50-69% R. ICA, 50-69% LICA  . Hyperlipidemia   . Arthritis   . Anxiety   . CHF (congestive heart failure) 03/23/12  . H/O amiodarone therapy     Started July, 2013  . PONV (postoperative nausea and vomiting)   . Atrial fibrillation     New onset July, 2013  . GERD (gastroesophageal reflux disease)   . ESRD (end stage renal disease) on dialysis     Dialysis, right arm shunt  / PCI tissue, interventional radiology September, 2010 / temporary catheter left subclavian, new AV fistula right arm maturing March, 201 t2  . ESRD     dialysis Tu,Thur,Sat  . COPD (chronic obstructive  pulmonary disease)     uses oxygen 2 liters 24/7  . Fatigue     Fatigue and sleepiness since hospitalization December, 2013    History  Substance Use Topics  . Smoking status: Former Smoker -- 2.00 packs/day for 50 years    Types: Cigarettes    Quit date: 09/23/2005  . Smokeless tobacco: Former Neurosurgeon    Quit date: 09/23/2005     Comment: SMOKED FROM AGE 57 UNTIL 2007  . Alcohol Use: No     Comment: NO ALCOHOL SINCE THE 70'S    Family History  Problem Relation Age of Onset  . Diabetes Mother   . Kidney disease Father     Allergies  Allergen Reactions  . Aspirin Anaphylaxis  . Penicillins Anaphylaxis  . Septra [Sulfamethoxazole-Trimethoprim] Nausea And Vomiting  . Nsaids Itching    Current outpatient prescriptions:albuterol (PROVENTIL) (2.5 MG/3ML) 0.083% nebulizer solution, Take 2.5 mg by nebulization every 6 (six) hours as needed. For shortness of breath, Disp: , Rfl: ;  ALPRAZolam (XANAX) 0.5 MG tablet, Take 0.5 mg by mouth 3 (three) times daily as needed. For anxiety , Disp: , Rfl: ;  amiodarone (PACERONE) 200 MG tablet, TAKE 1/2 TABLET DAILY, Disp: 15 tablet, Rfl: 3 folic acid-vitamin b complex-vitamin c-selenium-zinc (DIALYVITE) 3 MG TABS, Take 1 tablet by mouth daily.  , Disp: , Rfl: ;  furosemide (LASIX) 80  MG tablet, Take 80 mg by mouth 2 (two) times daily. 1.5 tablets in morning; 1 tablet noon., Disp: , Rfl: ;  HYDROcodone-acetaminophen (NORCO) 7.5-325 MG per tablet, Take 1 tablet by mouth every 6 (six) hours as needed for pain., Disp: , Rfl:  insulin NPH (HUMULIN N,NOVOLIN N) 100 UNIT/ML injection, Inject 10-40 Units into the skin 2 (two) times daily. 40 units is the morning and 10 units every evening, Disp: , Rfl: ;  isosorbide dinitrate (ISORDIL) 20 MG tablet, Take 20 mg by mouth 2 (two) times daily., Disp: , Rfl: ;  lidocaine-prilocaine (EMLA) cream, Apply 1 application topically once. Prior to dialysis on Tues, Thurs, and Sat., Disp: , Rfl:  omeprazole (PRILOSEC) 20 MG  capsule, Take 20 mg by mouth Daily., Disp: , Rfl: ;  pravastatin (PRAVACHOL) 40 MG tablet, Take 1 tablet (40 mg total) by mouth daily., Disp: 30 tablet, Rfl: 6;  sevelamer (RENVELA) 800 MG tablet, Take 1,600-2,400 mg by mouth See admin instructions. Take 3 tablets with meals and 2 tablets with snacks, Disp: , Rfl:  warfarin (COUMADIN) 2 MG tablet, Take coumadin 1 tablet daily except 1 1/2 tablets on Sundays, Disp: 45 tablet, Rfl: 3  BP 155/71  Pulse 88  Ht 5\' 3"  (1.6 m)  Wt 124 lb 3.2 oz (56.337 kg)  BMI 22.01 kg/m2  SpO2 98%  LMP 08/06/1986  Body mass index is 22.01 kg/(m^2).       Physical exam she does have palpable accident and fem-fem grafts. She does have palpable dorsalis pedis pulse in the left and no palpable pulses on the right. Her foot is well perfused. She does have a patent right upper arm AV access.  Impression and plan continued instability with known SFA occlusion but no tissue loss in the right foot. She will continue her walking program as much as possible. We will see her again on an as needed basis

## 2013-06-24 ENCOUNTER — Encounter: Payer: Self-pay | Admitting: Cardiology

## 2013-06-24 ENCOUNTER — Ambulatory Visit (INDEPENDENT_AMBULATORY_CARE_PROVIDER_SITE_OTHER): Payer: Medicare Other | Admitting: Cardiology

## 2013-06-24 VITALS — BP 146/63 | HR 88 | Ht 63.0 in | Wt 123.0 lb

## 2013-06-24 DIAGNOSIS — Z7901 Long term (current) use of anticoagulants: Secondary | ICD-10-CM

## 2013-06-24 DIAGNOSIS — I739 Peripheral vascular disease, unspecified: Secondary | ICD-10-CM

## 2013-06-24 DIAGNOSIS — I4891 Unspecified atrial fibrillation: Secondary | ICD-10-CM

## 2013-06-24 DIAGNOSIS — N186 End stage renal disease: Secondary | ICD-10-CM

## 2013-06-24 DIAGNOSIS — I5022 Chronic systolic (congestive) heart failure: Secondary | ICD-10-CM

## 2013-06-24 DIAGNOSIS — I059 Rheumatic mitral valve disease, unspecified: Secondary | ICD-10-CM

## 2013-06-24 DIAGNOSIS — I340 Nonrheumatic mitral (valve) insufficiency: Secondary | ICD-10-CM

## 2013-06-24 DIAGNOSIS — Z9229 Personal history of other drug therapy: Secondary | ICD-10-CM

## 2013-06-24 NOTE — Assessment & Plan Note (Signed)
Her volume is controlled with dialysis.

## 2013-06-24 NOTE — Progress Notes (Signed)
HPI  This sweet lady continues to manage with her severe cardiovascular disease. She is being watched very carefully by Dr. Arbie Cookey. There are no planned vascular procedures at this time. She had a good day on dialysis. Her volume is under reasonable control. She's not having any chest pain or shortness of breath.  Allergies  Allergen Reactions  . Aspirin Anaphylaxis  . Penicillins Anaphylaxis  . Septra [Sulfamethoxazole-Trimethoprim] Nausea And Vomiting  . Nsaids Itching    Current Outpatient Prescriptions  Medication Sig Dispense Refill  . albuterol (PROVENTIL) (2.5 MG/3ML) 0.083% nebulizer solution Take 2.5 mg by nebulization every 6 (six) hours as needed. For shortness of breath      . ALPRAZolam (XANAX) 0.5 MG tablet Take 0.5 mg by mouth 3 (three) times daily as needed. For anxiety       . amiodarone (PACERONE) 200 MG tablet TAKE 1/2 TABLET DAILY  15 tablet  3  . folic acid-vitamin b complex-vitamin c-selenium-zinc (DIALYVITE) 3 MG TABS Take 1 tablet by mouth daily.        . furosemide (LASIX) 80 MG tablet Take 80 mg by mouth 2 (two) times daily. 1.5 tablets in morning; 1 tablet noon.      Marland Kitchen HYDROcodone-acetaminophen (NORCO) 7.5-325 MG per tablet Take 1 tablet by mouth every 6 (six) hours as needed for pain.      Marland Kitchen insulin NPH (HUMULIN N,NOVOLIN N) 100 UNIT/ML injection Inject 10-40 Units into the skin 2 (two) times daily. 40 units is the morning and 10 units every evening      . isosorbide dinitrate (ISORDIL) 20 MG tablet Take 20 mg by mouth 2 (two) times daily.      Marland Kitchen lidocaine-prilocaine (EMLA) cream Apply 1 application topically once. Prior to dialysis on Tues, Thurs, and Sat.      Marland Kitchen omeprazole (PRILOSEC) 20 MG capsule Take 20 mg by mouth Daily.      . pravastatin (PRAVACHOL) 40 MG tablet Take 1 tablet (40 mg total) by mouth daily.  30 tablet  6  . sevelamer (RENVELA) 800 MG tablet Take 1,600-2,400 mg by mouth See admin instructions. Take 3 tablets with meals and 2 tablets with  snacks      . warfarin (COUMADIN) 2 MG tablet Take coumadin 1 tablet daily except 1 1/2 tablets on Sundays  45 tablet  3   No current facility-administered medications for this visit.    History   Social History  . Marital Status: Widowed    Spouse Name: RALPH    Number of Children: N/A  . Years of Education: N/A   Occupational History  . RETIRED     [employer]   Social History Main Topics  . Smoking status: Former Smoker -- 2.00 packs/day for 50 years    Types: Cigarettes    Quit date: 09/23/2005  . Smokeless tobacco: Former Neurosurgeon    Quit date: 09/23/2005     Comment: SMOKED FROM AGE 44 UNTIL 2007  . Alcohol Use: No     Comment: NO ALCOHOL SINCE THE 70'S  . Drug Use: No  . Sexual Activity: Not on file   Other Topics Concern  . Not on file   Social History Narrative  . No narrative on file    Family History  Problem Relation Age of Onset  . Diabetes Mother   . Kidney disease Father     Past Medical History  Diagnosis Date  . Mitral regurgitation     Mild-to-moderate, echo, January, 2010 /  moderate, e eccentric jet, echo, March, 2012, moderate to moderately severe by bedside echocardiogram December 2012  . Ejection fraction     Ejection fraction 25-30% January, 2013  . Dyslipidemia   . CAD (coronary artery disease)     Nuclear, January, 2010, no ischemia  . Hx of CABG     2007  . Cardiomyopathy     Ischemic status post coronary bypass grafting  . Diabetes mellitus   . Anemia     Chronic  . PAD (peripheral artery disease)     Right axillofemoral bypass, Dr. Arbie Cookey status post redo right axillofemoral bypass August 2012  . Aspirin allergy     Takes Plavix  . Hypertension   . Carotid artery disease     Doppler, May, 2012, 50-69% R. ICA, 50-69% LICA  . Hyperlipidemia   . Arthritis   . Anxiety   . CHF (congestive heart failure) 03/23/12  . H/O amiodarone therapy     Started July, 2013  . PONV (postoperative nausea and vomiting)   .  Atrial fibrillation     New onset July, 2013  . GERD (gastroesophageal reflux disease)   . ESRD (end stage renal disease) on dialysis     Dialysis, right arm shunt  / PCI tissue, interventional radiology September, 2010 / temporary catheter left subclavian, new AV fistula right arm maturing March, 201 t2  . ESRD     dialysis Tu,Thur,Sat  . COPD (chronic obstructive pulmonary disease)     uses oxygen 2 liters 24/7  . Fatigue     Fatigue and sleepiness since hospitalization December, 2013    Past Surgical History  Procedure Laterality Date  . Coronary artery bypass graft   07/27/2006    [name]. [name], M.D  . Abdominal hysterectomy    . Laparoscopic salpingoopherectomy    . Appendectomy    . Fem-fem bypass  2003    left to right   . Right femoral bypass  2004  . Basal cell carcinoma excision  2004    REMOVAL FROM NOSE  . Arteriovenous graft placement  right  11-30-2010,  05-20-2011  . Femoral-femoral bypass graft  06/12/2012    Procedure: BYPASS GRAFT FEMORAL-FEMORAL ARTERY;  Surgeon: Larina Earthly, MD;  Location: [facility] OR;  Service: Vascular;  Laterality: N/A;  Right to left femoral to femoral bypass    Patient Active Problem List   Diagnosis Date Noted  . Fatigue   . H/O amiodarone therapy   . Disturbance of skin sensation 04/16/2012  . Atrial fibrillation 04/07/2012  . Long term (current) use of anticoagulants 04/07/2012  . Cardiomyopathy   . Carotid artery disease   . Back pain 08/08/2011  . Cough 08/08/2011  . Chronic systolic heart failure 03/14/2011  . Hypertension   . Mitral regurgitation   . Ejection fraction   . Dyslipidemia   . CAD (coronary artery disease)   . Hx of CABG   . ESRD (end stage renal disease)   . Diabetes mellitus   . Anemia   . PAD (peripheral artery disease)   . Aspirin allergy     ROS   Patient denies fever, chills, headache, sweats, rash, change in vision, change in hearing, chest pain, cough, nausea vomiting, urinary symptoms. All  other systems are reviewed and are negative.  PHYSICAL EXAM  Patient is oriented to person time and place. Affect is normal. Her dialysis site is bandaged after dialysis today. There is no jugulovenous distention. Lungs reveal scattered rhonchi. Cardiac exam  reveals S1 and S2. There no clicks or significant murmurs. The abdomen is soft. There is no peripheral edema.  Filed Vitals:   06/24/13 1357  BP: 146/63  Pulse: 88  Height: 5\' 3"  (1.6 m)  Weight: 123 lb (55.792 kg)     ASSESSMENT & PLAN

## 2013-06-24 NOTE — Assessment & Plan Note (Signed)
Coumadin is continued for her atrial fibrillation.

## 2013-06-24 NOTE — Assessment & Plan Note (Signed)
There is history of moderate mitral regurgitation. She does not need a followup echo at this time.

## 2013-06-24 NOTE — Assessment & Plan Note (Signed)
Rhythm is stable.  No change in therapy. 

## 2013-06-24 NOTE — Assessment & Plan Note (Signed)
Patient is followed very carefully by Dr. Arbie Cookey.

## 2013-06-24 NOTE — Assessment & Plan Note (Signed)
Dialysis continues and she is stable.

## 2013-06-24 NOTE — Patient Instructions (Addendum)
Your physician recommends that you schedule a follow-up appointment in: 6 months. You will receive a reminder letter in the mail in about 4 months reminding you to call and schedule your appointment. If you don't receive this letter, please contact our office. Your physician recommends that you continue on your current medications as directed. Please refer to the Current Medication list given to you today. Your physician recommends that you have lab work done to check your TSH level. You may have this done at your next scheduled dialysis day. Please take the order with you and give to your nurse at dialysis.

## 2013-06-24 NOTE — Assessment & Plan Note (Signed)
Patient is very stable on low-dose amiodarone. Her labs are checked repeatedly and dialysis. We do need to check TSH.

## 2013-07-01 ENCOUNTER — Telehealth: Payer: Self-pay | Admitting: *Deleted

## 2013-07-01 NOTE — Telephone Encounter (Signed)
Message copied by Eustace Moore on Thu Jul 01, 2013  3:53 PM ------      Message from: Myrtis Ser, [place] D      Created: Thu Jul 01, 2013  3:42 PM       Please let her know that her thyroid test is normal ------

## 2013-07-01 NOTE — Telephone Encounter (Signed)
Patient informed. 

## 2013-07-06 ENCOUNTER — Ambulatory Visit (INDEPENDENT_AMBULATORY_CARE_PROVIDER_SITE_OTHER): Payer: Medicare Other | Admitting: *Deleted

## 2013-07-06 DIAGNOSIS — I5022 Chronic systolic (congestive) heart failure: Secondary | ICD-10-CM

## 2013-07-06 DIAGNOSIS — I4891 Unspecified atrial fibrillation: Secondary | ICD-10-CM

## 2013-07-06 DIAGNOSIS — Z7901 Long term (current) use of anticoagulants: Secondary | ICD-10-CM

## 2013-07-07 ENCOUNTER — Other Ambulatory Visit: Payer: Self-pay

## 2013-07-07 MED ORDER — PRAVASTATIN SODIUM 40 MG PO TABS: 40.0000 mg | ORAL_TABLET | Freq: Every day | ORAL | Status: DC

## 2013-07-12 ENCOUNTER — Other Ambulatory Visit: Payer: Self-pay | Admitting: Cardiology

## 2013-08-03 ENCOUNTER — Ambulatory Visit (INDEPENDENT_AMBULATORY_CARE_PROVIDER_SITE_OTHER): Payer: Medicare Other | Admitting: *Deleted

## 2013-08-03 DIAGNOSIS — I5022 Chronic systolic (congestive) heart failure: Secondary | ICD-10-CM

## 2013-08-03 DIAGNOSIS — I4891 Unspecified atrial fibrillation: Secondary | ICD-10-CM

## 2013-08-03 DIAGNOSIS — Z7901 Long term (current) use of anticoagulants: Secondary | ICD-10-CM

## 2013-08-03 LAB — POCT INR: INR: 6

## 2013-08-10 ENCOUNTER — Ambulatory Visit (INDEPENDENT_AMBULATORY_CARE_PROVIDER_SITE_OTHER): Payer: Medicare Other | Admitting: *Deleted

## 2013-08-10 DIAGNOSIS — I5022 Chronic systolic (congestive) heart failure: Secondary | ICD-10-CM

## 2013-08-10 DIAGNOSIS — I4891 Unspecified atrial fibrillation: Secondary | ICD-10-CM

## 2013-08-10 DIAGNOSIS — Z7901 Long term (current) use of anticoagulants: Secondary | ICD-10-CM

## 2013-08-31 ENCOUNTER — Ambulatory Visit (INDEPENDENT_AMBULATORY_CARE_PROVIDER_SITE_OTHER): Payer: Medicare Other | Admitting: *Deleted

## 2013-08-31 DIAGNOSIS — Z7901 Long term (current) use of anticoagulants: Secondary | ICD-10-CM

## 2013-08-31 DIAGNOSIS — I4891 Unspecified atrial fibrillation: Secondary | ICD-10-CM

## 2013-08-31 DIAGNOSIS — I5022 Chronic systolic (congestive) heart failure: Secondary | ICD-10-CM

## 2013-08-31 LAB — POCT INR: INR: >8

## 2013-09-03 ENCOUNTER — Ambulatory Visit (INDEPENDENT_AMBULATORY_CARE_PROVIDER_SITE_OTHER): Payer: Medicare Other | Admitting: *Deleted

## 2013-09-03 DIAGNOSIS — I4891 Unspecified atrial fibrillation: Secondary | ICD-10-CM

## 2013-09-03 DIAGNOSIS — I5022 Chronic systolic (congestive) heart failure: Secondary | ICD-10-CM

## 2013-09-03 DIAGNOSIS — Z7901 Long term (current) use of anticoagulants: Secondary | ICD-10-CM

## 2013-09-06 ENCOUNTER — Encounter: Payer: Self-pay | Admitting: Cardiology

## 2013-09-07 ENCOUNTER — Encounter: Payer: Self-pay | Admitting: Cardiology

## 2013-09-21 ENCOUNTER — Ambulatory Visit (INDEPENDENT_AMBULATORY_CARE_PROVIDER_SITE_OTHER): Payer: Medicare Other | Admitting: *Deleted

## 2013-09-21 DIAGNOSIS — Z7901 Long term (current) use of anticoagulants: Secondary | ICD-10-CM

## 2013-09-21 DIAGNOSIS — I4891 Unspecified atrial fibrillation: Secondary | ICD-10-CM

## 2013-09-21 DIAGNOSIS — I5022 Chronic systolic (congestive) heart failure: Secondary | ICD-10-CM

## 2013-09-24 ENCOUNTER — Ambulatory Visit (INDEPENDENT_AMBULATORY_CARE_PROVIDER_SITE_OTHER): Payer: Medicare Other | Admitting: *Deleted

## 2013-09-24 DIAGNOSIS — I5022 Chronic systolic (congestive) heart failure: Secondary | ICD-10-CM

## 2013-09-24 DIAGNOSIS — I4891 Unspecified atrial fibrillation: Secondary | ICD-10-CM

## 2013-09-24 DIAGNOSIS — Z7901 Long term (current) use of anticoagulants: Secondary | ICD-10-CM

## 2013-09-24 LAB — POCT INR: INR: 2.1

## 2013-09-28 ENCOUNTER — Telehealth: Payer: Self-pay | Admitting: *Deleted

## 2013-09-28 NOTE — Telephone Encounter (Signed)
Pt had port a cath placed yesterday by Dr Gabriel CirrieMason.  He told pt to continue to hold coumadin 1/5 and 1/6.  She will restart coumadin on 1/7.  She was started on keflex 1/5 as well.  Has INR appt on 10/01/13.

## 2013-10-01 ENCOUNTER — Ambulatory Visit (INDEPENDENT_AMBULATORY_CARE_PROVIDER_SITE_OTHER): Payer: Medicare Other | Admitting: *Deleted

## 2013-10-01 DIAGNOSIS — I4891 Unspecified atrial fibrillation: Secondary | ICD-10-CM

## 2013-10-01 DIAGNOSIS — Z7901 Long term (current) use of anticoagulants: Secondary | ICD-10-CM

## 2013-10-01 DIAGNOSIS — I5022 Chronic systolic (congestive) heart failure: Secondary | ICD-10-CM

## 2013-10-01 LAB — POCT INR: INR: 1.3

## 2013-10-06 ENCOUNTER — Telehealth: Payer: Self-pay

## 2013-10-06 DIAGNOSIS — R209 Unspecified disturbances of skin sensation: Secondary | ICD-10-CM

## 2013-10-06 DIAGNOSIS — M79609 Pain in unspecified limb: Secondary | ICD-10-CM

## 2013-10-06 NOTE — Telephone Encounter (Signed)
Phone call from pt.  Reports constant pain in right foot since Sunday; describes as a "burning / stinging" type of pain.  States "it hurts to put my foot on the floor.  Reports numbness in both feet, but the right is worse than the left.  States that the right foot feels cold.  Denies open sores; denies swelling of right foot/ lower extremity.  Also reports has "dark spots" on her right leg.  States she had the dialysis nurse check her right foot yesterday, and the nurse said she couldn't feel a pulse.  Discussed with Dr. Edilia Boickson.  Recommends bringing to office 1/15 for duplex of Ax-Fem BPG, and ABI's.  Pt. States she has HD in the morning 6:30-11:00 AM.  Advised will call her at dialysis with an appt. for tomorrow.  Agrees with plan.

## 2013-10-07 ENCOUNTER — Encounter: Payer: Self-pay | Admitting: Vascular Surgery

## 2013-10-07 ENCOUNTER — Ambulatory Visit (INDEPENDENT_AMBULATORY_CARE_PROVIDER_SITE_OTHER): Payer: Medicare Other | Admitting: Vascular Surgery

## 2013-10-07 ENCOUNTER — Ambulatory Visit (HOSPITAL_COMMUNITY)
Admission: RE | Admit: 2013-10-07 | Discharge: 2013-10-07 | Disposition: A | Discharge status: Home / Self Care | Payer: Medicare Other | Source: Ambulatory Visit | Attending: Vascular Surgery | Admitting: Vascular Surgery

## 2013-10-07 ENCOUNTER — Ambulatory Visit (HOSPITAL_COMMUNITY): Admission: RE | Admit: 2013-10-07 | Payer: Medicare Other | Source: Ambulatory Visit

## 2013-10-07 ENCOUNTER — Ambulatory Visit (INDEPENDENT_AMBULATORY_CARE_PROVIDER_SITE_OTHER)
Admission: RE | Admit: 2013-10-07 | Discharge: 2013-10-07 | Disposition: A | Discharge status: Home / Self Care | Payer: Medicare Other | Source: Ambulatory Visit | Attending: Vascular Surgery | Admitting: Vascular Surgery

## 2013-10-07 VITALS — BP 125/43 | HR 85 | Ht 63.0 in | Wt 117.0 lb

## 2013-10-07 DIAGNOSIS — M79609 Pain in unspecified limb: Secondary | ICD-10-CM | POA: Insufficient documentation

## 2013-10-07 DIAGNOSIS — I739 Peripheral vascular disease, unspecified: Secondary | ICD-10-CM

## 2013-10-07 DIAGNOSIS — R238 Other skin changes: Secondary | ICD-10-CM

## 2013-10-07 DIAGNOSIS — R209 Unspecified disturbances of skin sensation: Secondary | ICD-10-CM

## 2013-10-07 MED ORDER — OXYCODONE-ACETAMINOPHEN 5-325 MG PO TABS: 1.0000 | ORAL_TABLET | ORAL | Status: DC | PRN

## 2013-10-07 NOTE — Progress Notes (Signed)
Patient is a 73 year old female who previously underwent right axillary femoral and right to left femorofemoral bypass by Dr. Arbie CookeyEarly. She was last seen in September 2013. She has a four-day history of pain numbness burning in her right foot. She has previously had numbness and tingling in the right foot but never pain. She is on home oxygen. She is minimally ambulatory and does not really describe claudication. She is a renal dialysis patient. Other chronic medical problems include hyperlipidemia, coronary disease, cardiomyopathy, diabetes and COPD all of which are currently stable.  She is on Coumadin for atrial fibrillation. She is currently taking hydrocodone for the pain with no relief.  Past Medical History  Diagnosis Date  . Mitral regurgitation     Mild-to-moderate, echo, January, 2010 / moderate, e eccentric jet, echo, March, 2012, moderate to moderately severe by bedside echocardiogram December 2012  . Ejection fraction     Ejection fraction 25-30% January, 2013  . Dyslipidemia   . CAD (coronary artery disease)     Nuclear, January, 2010, no ischemia  . Hx of CABG     2007  . Cardiomyopathy     Ischemic status post coronary bypass grafting  . Diabetes mellitus   . Anemia     Chronic  . PAD (peripheral artery disease)     Right axillofemoral bypass, Dr. Arbie CookeyEarly status post redo right axillofemoral bypass August 2012  . Aspirin allergy     Takes Plavix  . Hypertension   . Carotid artery disease     Doppler, May, 2012, 50-69% R. ICA, 50-69% LICA  . Hyperlipidemia   . Arthritis   . Anxiety   . CHF (congestive heart failure) 03/23/12  . H/O amiodarone therapy     Started July, 2013  . PONV (postoperative nausea and vomiting)   . Atrial fibrillation     New onset July, 2013  . GERD (gastroesophageal reflux disease)   . ESRD (end stage renal disease) on dialysis     Dialysis, right arm shunt  / PCI tissue, interventional radiology September, 2010 / temporary catheter left subclavian,  new AV fistula right arm maturing March, 201 t2  . ESRD     dialysis Tu,Thur,Sat  . COPD (chronic obstructive pulmonary disease)     uses oxygen 2 liters 24/7  . Fatigue     Fatigue and sleepiness since hospitalization December, 2013  . Myocardial infarction    Current Outpatient Prescriptions on File Prior to Visit  Medication Sig Dispense Refill  . albuterol (PROVENTIL) (2.5 MG/3ML) 0.083% nebulizer solution Take 2.5 mg by nebulization every 6 (six) hours as needed. For shortness of breath      . ALPRAZolam (XANAX) 0.5 MG tablet Take 0.5 mg by mouth 3 (three) times daily as needed. For anxiety       . amiodarone (PACERONE) 200 MG tablet TAKE 1/2 TABLET DAILY  15 tablet  3  . folic acid-vitamin b complex-vitamin c-selenium-zinc (DIALYVITE) 3 MG TABS Take 1 tablet by mouth daily.        . furosemide (LASIX) 80 MG tablet Take 80 mg by mouth 2 (two) times daily. 1.5 tablets in morning; 1 tablet noon.      . insulin NPH (HUMULIN N,NOVOLIN N) 100 UNIT/ML injection Inject 10-40 Units into the skin 2 (two) times daily. 40 units is the morning and 10 units every evening      . isosorbide dinitrate (ISORDIL) 20 MG tablet Take 20 mg by mouth 2 (two) times daily.      .Marland Kitchen  lidocaine-prilocaine (EMLA) cream Apply 1 application topically once. Prior to dialysis on Tues, Thurs, and Sat.      Marland Kitchen omeprazole (PRILOSEC) 20 MG capsule Take 20 mg by mouth Daily.      . pravastatin (PRAVACHOL) 40 MG tablet Take 1 tablet (40 mg total) by mouth daily.  30 tablet  11  . sevelamer (RENVELA) 800 MG tablet Take 1,600-2,400 mg by mouth See admin instructions. Take 3 tablets with meals and 2 tablets with snacks      . warfarin (COUMADIN) 2 MG tablet Take 1 tablet (2 mg total) by mouth daily. Or as directed by coumadin clinic  45 tablet  6  . HYDROcodone-acetaminophen (NORCO) 7.5-325 MG per tablet Take 1 tablet by mouth every 6 (six) hours as needed for pain.       No current facility-administered medications on file prior  to visit.   Review of systems: She denies fever or chills. She denies symptoms in the left lower extremity. She denies chest pain. She denies any changes in her respiratory status. She denies swelling in the legs.  Physical exam:  Filed Vitals:   10/07/13 1224  BP: 125/43  Pulse: 85  Height: 5\' 3"  (1.6 m)  Weight: 117 lb (53.071 kg)  SpO2: 94%    Extremities: Easily palpable right axfem pulse palpable femoral femoral pulse no palpable pedal pulses bilaterally both feet warm symmetrically well-perfused  Data: Patient had a graft duplex exam today that showed patent axfem in fem-fem bypasses. ABIs were not calculated due to calcified vessels. She had monophasic flow in the lower extremities bilaterally. Of note she has known chronic superficial femoral artery occlusions.  Assessment: Right foot pain most likely neuropathic in nature she does have chronic lower extremity arterial occlusive disease. However both of her bypass grafts are patent today. She may be having slow worsening of tibial artery occlusive disease. However she is not really a very good candidate for any lower extremity revascularization procedures.  Plan: The patient was given a prescription today for Percocet to see if this improves her pain in the next few days. If she continues to have persistent pain or wound development or breakdown of skin further evaluation with arteriogram could be considered.  Fabienne Bruns, MD [facility] [place] Office: [phone] Pager: [phone]

## 2013-10-07 NOTE — Telephone Encounter (Signed)
Pt. Notified of appt. today at 11:30 AM for vascular studies and MD appt. to evaluate symptoms.  Agrees w/ plan.

## 2013-10-08 ENCOUNTER — Ambulatory Visit (INDEPENDENT_AMBULATORY_CARE_PROVIDER_SITE_OTHER): Payer: Medicare Other | Admitting: *Deleted

## 2013-10-08 DIAGNOSIS — I5022 Chronic systolic (congestive) heart failure: Secondary | ICD-10-CM

## 2013-10-08 DIAGNOSIS — Z7901 Long term (current) use of anticoagulants: Secondary | ICD-10-CM

## 2013-10-08 DIAGNOSIS — I4891 Unspecified atrial fibrillation: Secondary | ICD-10-CM

## 2013-10-08 LAB — POCT INR: INR: 3.7

## 2013-10-11 ENCOUNTER — Encounter: Payer: Self-pay | Admitting: Cardiology

## 2013-10-11 LAB — PROTIME-INR: INR: 2.9 — AB (ref 0.9–1.1)

## 2013-10-13 ENCOUNTER — Encounter: Payer: Self-pay | Admitting: Cardiology

## 2013-10-15 ENCOUNTER — Ambulatory Visit (INDEPENDENT_AMBULATORY_CARE_PROVIDER_SITE_OTHER): Payer: Medicare Other | Admitting: *Deleted

## 2013-10-15 DIAGNOSIS — I4891 Unspecified atrial fibrillation: Secondary | ICD-10-CM

## 2013-10-15 DIAGNOSIS — Z7901 Long term (current) use of anticoagulants: Secondary | ICD-10-CM

## 2013-10-15 DIAGNOSIS — I5022 Chronic systolic (congestive) heart failure: Secondary | ICD-10-CM

## 2013-10-22 ENCOUNTER — Ambulatory Visit (INDEPENDENT_AMBULATORY_CARE_PROVIDER_SITE_OTHER): Payer: Medicare Other | Admitting: *Deleted

## 2013-10-22 DIAGNOSIS — I4891 Unspecified atrial fibrillation: Secondary | ICD-10-CM

## 2013-10-22 DIAGNOSIS — Z7901 Long term (current) use of anticoagulants: Secondary | ICD-10-CM

## 2013-10-22 DIAGNOSIS — I5022 Chronic systolic (congestive) heart failure: Secondary | ICD-10-CM

## 2013-10-22 DIAGNOSIS — Z5181 Encounter for therapeutic drug level monitoring: Secondary | ICD-10-CM

## 2013-10-22 LAB — POCT INR: INR: 1.9

## 2013-10-25 ENCOUNTER — Other Ambulatory Visit: Payer: Self-pay | Admitting: Cardiology

## 2013-11-05 ENCOUNTER — Ambulatory Visit (INDEPENDENT_AMBULATORY_CARE_PROVIDER_SITE_OTHER): Payer: Medicare Other | Admitting: *Deleted

## 2013-11-05 DIAGNOSIS — Z7901 Long term (current) use of anticoagulants: Secondary | ICD-10-CM

## 2013-11-05 DIAGNOSIS — Z5181 Encounter for therapeutic drug level monitoring: Secondary | ICD-10-CM

## 2013-11-05 DIAGNOSIS — I4891 Unspecified atrial fibrillation: Secondary | ICD-10-CM

## 2013-11-05 DIAGNOSIS — I5022 Chronic systolic (congestive) heart failure: Secondary | ICD-10-CM

## 2013-11-05 LAB — POCT INR: INR: 2.6

## 2013-11-26 ENCOUNTER — Ambulatory Visit (INDEPENDENT_AMBULATORY_CARE_PROVIDER_SITE_OTHER): Payer: Medicare Other | Admitting: *Deleted

## 2013-11-26 DIAGNOSIS — Z7901 Long term (current) use of anticoagulants: Secondary | ICD-10-CM

## 2013-11-26 DIAGNOSIS — I4891 Unspecified atrial fibrillation: Secondary | ICD-10-CM

## 2013-11-26 DIAGNOSIS — Z5181 Encounter for therapeutic drug level monitoring: Secondary | ICD-10-CM

## 2013-11-26 DIAGNOSIS — I5022 Chronic systolic (congestive) heart failure: Secondary | ICD-10-CM

## 2013-11-26 LAB — POCT INR: INR: 2.7

## 2013-12-09 ENCOUNTER — Encounter: Payer: Self-pay | Admitting: Cardiology

## 2013-12-09 DIAGNOSIS — I350 Nonrheumatic aortic (valve) stenosis: Secondary | ICD-10-CM | POA: Insufficient documentation

## 2013-12-10 ENCOUNTER — Ambulatory Visit (INDEPENDENT_AMBULATORY_CARE_PROVIDER_SITE_OTHER): Payer: Medicare Other | Admitting: Cardiology

## 2013-12-10 ENCOUNTER — Encounter: Payer: Self-pay | Admitting: Cardiology

## 2013-12-10 VITALS — BP 146/64 | HR 85 | Ht 63.0 in | Wt 114.0 lb

## 2013-12-10 DIAGNOSIS — I359 Nonrheumatic aortic valve disorder, unspecified: Secondary | ICD-10-CM

## 2013-12-10 DIAGNOSIS — I059 Rheumatic mitral valve disease, unspecified: Secondary | ICD-10-CM

## 2013-12-10 DIAGNOSIS — I4891 Unspecified atrial fibrillation: Secondary | ICD-10-CM

## 2013-12-10 DIAGNOSIS — I340 Nonrheumatic mitral (valve) insufficiency: Secondary | ICD-10-CM

## 2013-12-10 DIAGNOSIS — R9430 Abnormal result of cardiovascular function study, unspecified: Secondary | ICD-10-CM

## 2013-12-10 DIAGNOSIS — I5022 Chronic systolic (congestive) heart failure: Secondary | ICD-10-CM

## 2013-12-10 DIAGNOSIS — N186 End stage renal disease: Secondary | ICD-10-CM

## 2013-12-10 DIAGNOSIS — Z9229 Personal history of other drug therapy: Secondary | ICD-10-CM

## 2013-12-10 DIAGNOSIS — R0989 Other specified symptoms and signs involving the circulatory and respiratory systems: Secondary | ICD-10-CM

## 2013-12-10 DIAGNOSIS — I350 Nonrheumatic aortic (valve) stenosis: Secondary | ICD-10-CM

## 2013-12-10 DIAGNOSIS — Z9981 Dependence on supplemental oxygen: Secondary | ICD-10-CM

## 2013-12-10 DIAGNOSIS — I2510 Atherosclerotic heart disease of native coronary artery without angina pectoris: Secondary | ICD-10-CM

## 2013-12-10 NOTE — Assessment & Plan Note (Signed)
She is not having any significant chest pain at this time. No change in therapy.

## 2013-12-10 NOTE — Assessment & Plan Note (Addendum)
She is on low-dose amiodarone. TSH was normal in October, 2014. She tells me today that she had a recent TSH with her primary physician that was in the normal range.

## 2013-12-10 NOTE — Assessment & Plan Note (Signed)
Her volume is controlled on dialysis.

## 2013-12-10 NOTE — Assessment & Plan Note (Signed)
She continues on dialysis. Unfortunately she has access problems. Her blood pressure drops and we're not able to use medications for her left ventricular dysfunction.

## 2013-12-10 NOTE — Assessment & Plan Note (Signed)
The patient felt poorly when she had rapid atrial fibrillation in July, 2013. She has been stable on low-dose amiodarone and it will be continued. She is anticoagulated.

## 2013-12-10 NOTE — Patient Instructions (Signed)

## 2013-12-10 NOTE — Progress Notes (Signed)
HPI   Patient is seen today to followup coronary disease and severe cardiomyopathy. I saw her last October, 2014. After that time we arranged for her thyroid to be checked. He was in the normal range. She is on low-dose amiodarone. Unfortunately she continues to have many medical problems. She was hospitalized in January, 2015. Intermittently she has problems with acute volume overload and required urgent dialysis. The rest of the time she has low blood pressure in dialysis that limits the ability to remove fluid. She's also having repeated vascular access problems. Today she is stable.  As part of today's evaluation I have reviewed the records from her hospitalization in January, 2015. I have reviewed the echo report concerning the assessment of her LV function.  Allergies  Allergen Reactions  . Aspirin Anaphylaxis  . Penicillins Anaphylaxis  . Septra [Sulfamethoxazole-Trimethoprim] Nausea And Vomiting  . Nsaids Itching    Current Outpatient Prescriptions  Medication Sig Dispense Refill  . albuterol (PROVENTIL) (2.5 MG/3ML) 0.083% nebulizer solution Take 2.5 mg by nebulization every 6 (six) hours as needed. For shortness of breath      . ALPRAZolam (XANAX) 0.5 MG tablet Take 0.5 mg by mouth 3 (three) times daily as needed. For anxiety       . amiodarone (PACERONE) 200 MG tablet TAKE ONE-HALF (1/2) TABLET BY MOUTH ONCE DAILY  15 tablet  6  . cinacalcet (SENSIPAR) 30 MG tablet Take 30 mg by mouth daily.      . folic acid-vitamin b complex-vitamin c-selenium-zinc (DIALYVITE) 3 MG TABS Take 1 tablet by mouth daily.        . furosemide (LASIX) 80 MG tablet Take 80 mg by mouth 2 (two) times daily. 1.5 tablets in morning; 1 tablet noon.      Marland Kitchen. HYDROcodone-acetaminophen (NORCO) 7.5-325 MG per tablet Take 1 tablet by mouth every 6 (six) hours as needed for pain.      Marland Kitchen. insulin NPH (HUMULIN N,NOVOLIN N) 100 UNIT/ML injection Inject 10-40 Units into the skin 2 (two) times daily as needed. 40 units  is the morning and 10 units every evening      . lidocaine-prilocaine (EMLA) cream Apply 1 application topically once. Prior to dialysis on Tues, Thurs, and Sat.      Marland Kitchen. omeprazole (PRILOSEC) 20 MG capsule Take 20 mg by mouth Daily.      . pravastatin (PRAVACHOL) 40 MG tablet Take 1 tablet (40 mg total) by mouth daily.  30 tablet  11  . sevelamer (RENVELA) 800 MG tablet Take 1,600-2,400 mg by mouth See admin instructions. Take 3 tablets with meals and 2 tablets with snacks      . warfarin (COUMADIN) 2 MG tablet Take 1 tablet (2 mg total) by mouth daily. Or as directed by coumadin clinic  45 tablet  6   No current facility-administered medications for this visit.    History   Social History  . Marital Status: Widowed    Spouse Name: RALPH    Number of Children: N/A  . Years of Education: N/A   Occupational History  . RETIRED     [employer]   Social History Main Topics  . Smoking status: Former Smoker -- 2.00 packs/day for 50 years    Types: Cigarettes    Quit date: 09/23/2005  . Smokeless tobacco: Former NeurosurgeonUser    Quit date: 09/23/2005     Comment: SMOKED FROM AGE 35 UNTIL 2007  . Alcohol Use: No     Comment: NO  ALCOHOL SINCE THE 70'S  . Drug Use: No  . Sexual Activity: Not on file   Other Topics Concern  . Not on file   Social History Narrative  . No narrative on file    Family History  Problem Relation Age of Onset  . Diabetes Mother   . Cancer Mother   . Kidney disease Father   . Diabetes Father   . Heart disease Father   . Hypertension Father   . Cancer Sister   . Diabetes Sister   . Diabetes Sister   . Cancer Brother   . Diabetes Brother   . Cancer Brother   . Diabetes Brother   . Diabetes Daughter   . Diabetes Son     Past Medical History  Diagnosis Date  . Mitral regurgitation     Mild-to-moderate, echo, January, 2010 / moderate, e eccentric jet, echo, March, 2012, moderate to moderately severe by bedside echocardiogram December  2012  . Ejection fraction     Ejection fraction 25-30% January, 2013  . Dyslipidemia   . CAD (coronary artery disease)     Nuclear, January, 2010, no ischemia  . Hx of CABG     2007  . Cardiomyopathy     Ischemic status post coronary bypass grafting  . Diabetes mellitus   . Anemia     Chronic  . PAD (peripheral artery disease)     Right axillofemoral bypass, Dr. Arbie Cookey status post redo right axillofemoral bypass August 2012  . Aspirin allergy     Takes Plavix  . Hypertension   . Carotid artery disease     Doppler, May, 2012, 50-69% R. ICA, 50-69% LICA  . Hyperlipidemia   . Arthritis   . Anxiety   . CHF (congestive heart failure) 03/23/12  . H/O amiodarone therapy     Started July, 2013  . PONV (postoperative nausea and vomiting)   . Atrial fibrillation     New onset July, 2013  . GERD (gastroesophageal reflux disease)   . ESRD (end stage renal disease) on dialysis     Dialysis, right arm shunt  / PCI tissue, interventional radiology September, 2010 / temporary catheter left subclavian, new AV fistula right arm maturing March, 201 t2  . ESRD     dialysis Tu,Thur,Sat  . COPD (chronic obstructive pulmonary disease)     uses oxygen 2 liters 24/7  . Fatigue     Fatigue and sleepiness since hospitalization December, 2013  . Myocardial infarction     Past Surgical History  Procedure Laterality Date  . Coronary artery bypass graft   07/27/2006    [name]. [name], M.D  . Abdominal hysterectomy    . Laparoscopic salpingoopherectomy    . Appendectomy    . Fem-fem bypass  2003    left to right   . Right femoral bypass  2004  . Basal cell carcinoma excision  2004    REMOVAL FROM NOSE  . Arteriovenous graft placement  right  11-30-2010,  05-20-2011  . Femoral-femoral bypass graft  06/12/2012    Procedure: BYPASS GRAFT FEMORAL-FEMORAL ARTERY;  Surgeon: Larina Earthly, MD;  Location: [facility] OR;  Service: Vascular;  Laterality: N/A;  Right to left femoral to femoral bypass     Patient Active Problem List   Diagnosis Date Noted  . Aortic stenosis 12/09/2013  . Encounter for therapeutic drug monitoring 10/22/2013  . Cold right foot 10/07/2013  . PVD (peripheral vascular disease) 10/07/2013  . Fatigue   . H/O  amiodarone therapy   . Disturbance of skin sensation 04/16/2012  . Atrial fibrillation 04/07/2012  . Long term (current) use of anticoagulants 04/07/2012  . Cardiomyopathy   . Carotid artery disease   . Back pain 08/08/2011  . Cough 08/08/2011  . Chronic systolic heart failure 03/14/2011  . Hypertension   . Mitral regurgitation   . Ejection fraction   . Dyslipidemia   . CAD (coronary artery disease)   . Hx of CABG   . ESRD (end stage renal disease)   . Diabetes mellitus   . Anemia   . PAD (peripheral artery disease)   . Aspirin allergy     ROS   Patient denies fever, chills, headache, sweats, rash, change in vision, change in hearing, chest pain, cough, nausea or vomiting, urinary symptoms. All other systems are reviewed and are negative.  PHYSICAL EXAM  Patient is oriented to person time and place. Affect is normal. She is thin but stable. She's wearing portable O2. There is no jugulovenous distention. Lungs reveal no rales. Cardiac exam reveals S1 and S2. The abdomen is soft. There is no significant peripheral edema. There no musculoskeletal deformities. There are no skin rashes.  Filed Vitals:   12/10/13 1316  BP: 146/64  Pulse: 85  Height: 5\' 3"  (1.6 m)  Weight: 114 lb (51.71 kg)  SpO2: 98%    EKG  ASSESSMENT & PLAN

## 2013-12-10 NOTE — Assessment & Plan Note (Addendum)
Her ejection fraction remains in the 25-30% range by echo, January, 2015. We are very limited in the medications he can be used. She tells me that Losartan was started for one day. She had decrease in her blood pressure. I am in agreement with not continuing this medication

## 2013-12-10 NOTE — Assessment & Plan Note (Signed)
She has a calcified aortic valve by echo. The gradient was low but her LV function is low. This will be followed.  As part of today's evaluation I spent greater than 25 minutes with her total care. More than half of this time is been with direct contact with her reviewing all of her medical issues.

## 2013-12-10 NOTE — Assessment & Plan Note (Signed)
Patient has mild to moderate mitral regurgitation by echo, January, 2015. No change in therapy.

## 2013-12-23 ENCOUNTER — Encounter (HOSPITAL_COMMUNITY): Payer: Self-pay | Admitting: Pharmacy Technician

## 2013-12-24 ENCOUNTER — Ambulatory Visit (INDEPENDENT_AMBULATORY_CARE_PROVIDER_SITE_OTHER): Payer: Medicare Other | Admitting: *Deleted

## 2013-12-24 DIAGNOSIS — Z7901 Long term (current) use of anticoagulants: Secondary | ICD-10-CM

## 2013-12-24 DIAGNOSIS — Z5181 Encounter for therapeutic drug level monitoring: Secondary | ICD-10-CM

## 2013-12-24 DIAGNOSIS — I4891 Unspecified atrial fibrillation: Secondary | ICD-10-CM

## 2013-12-24 DIAGNOSIS — I5022 Chronic systolic (congestive) heart failure: Secondary | ICD-10-CM

## 2013-12-24 LAB — POCT INR: INR: 1.6

## 2013-12-31 ENCOUNTER — Other Ambulatory Visit: Payer: Self-pay

## 2014-01-03 ENCOUNTER — Encounter: Payer: Self-pay | Admitting: Cardiology

## 2014-01-04 ENCOUNTER — Telehealth: Payer: Self-pay | Admitting: Cardiology

## 2014-01-04 MED ORDER — SODIUM CHLORIDE 0.9 % IJ SOLN: 3.0000 mL | INTRAMUSCULAR | Status: DC | PRN

## 2014-01-04 NOTE — Telephone Encounter (Signed)
Patient called asking what she needs to do about her coumadin and starting it back or not

## 2014-01-05 ENCOUNTER — Encounter (HOSPITAL_COMMUNITY): Admission: RE | Payer: Self-pay | Source: Ambulatory Visit

## 2014-01-05 ENCOUNTER — Ambulatory Visit (HOSPITAL_COMMUNITY): Admission: RE | Admit: 2014-01-05 | Payer: Medicare Other | Source: Ambulatory Visit | Admitting: Surgery

## 2014-01-05 SURGERY — ASSESSMENT, SHUNT FUNCTION, WITH CONTRAST RADIOGRAPHIC STUDY
Anesthesia: LOCAL

## 2014-01-05 NOTE — Telephone Encounter (Signed)
Called pt to discuss coumadin. Family member [facility]  Was admitted yesterday.  Told family member to call for appt when pt gets out of hospital.

## 2014-01-28 ENCOUNTER — Telehealth: Payer: Self-pay | Admitting: Cardiology

## 2014-01-28 NOTE — Telephone Encounter (Signed)
Coumadin was on hold for fistulagram several wks ago with orders to restart coumadin after procedure.  [facility] and [facility].  Pt called today and stated she just realized she has not been taking coumadin while in hospital or nursing home.  Reviewed hospital records and no reason found why pt should not be back on coumadin.  [facility] restart coumadin at 2mg  daily except 1mg  on Mondays if OK with Dr Dimas AguasHoward, pt's PCP.  Bonita QuinLinda said she would send order to Dr Dimas AguasHoward for approval.

## 2014-02-18 IMAGING — CR DG CHEST 2V
2 series · 2 of 2 positions shown · non-contrast
Comparison: 04/06/2012

CLINICAL DATA: Preop vascular surgery.

CHEST - 2 VIEW

[view not recorded (1 of 2)]
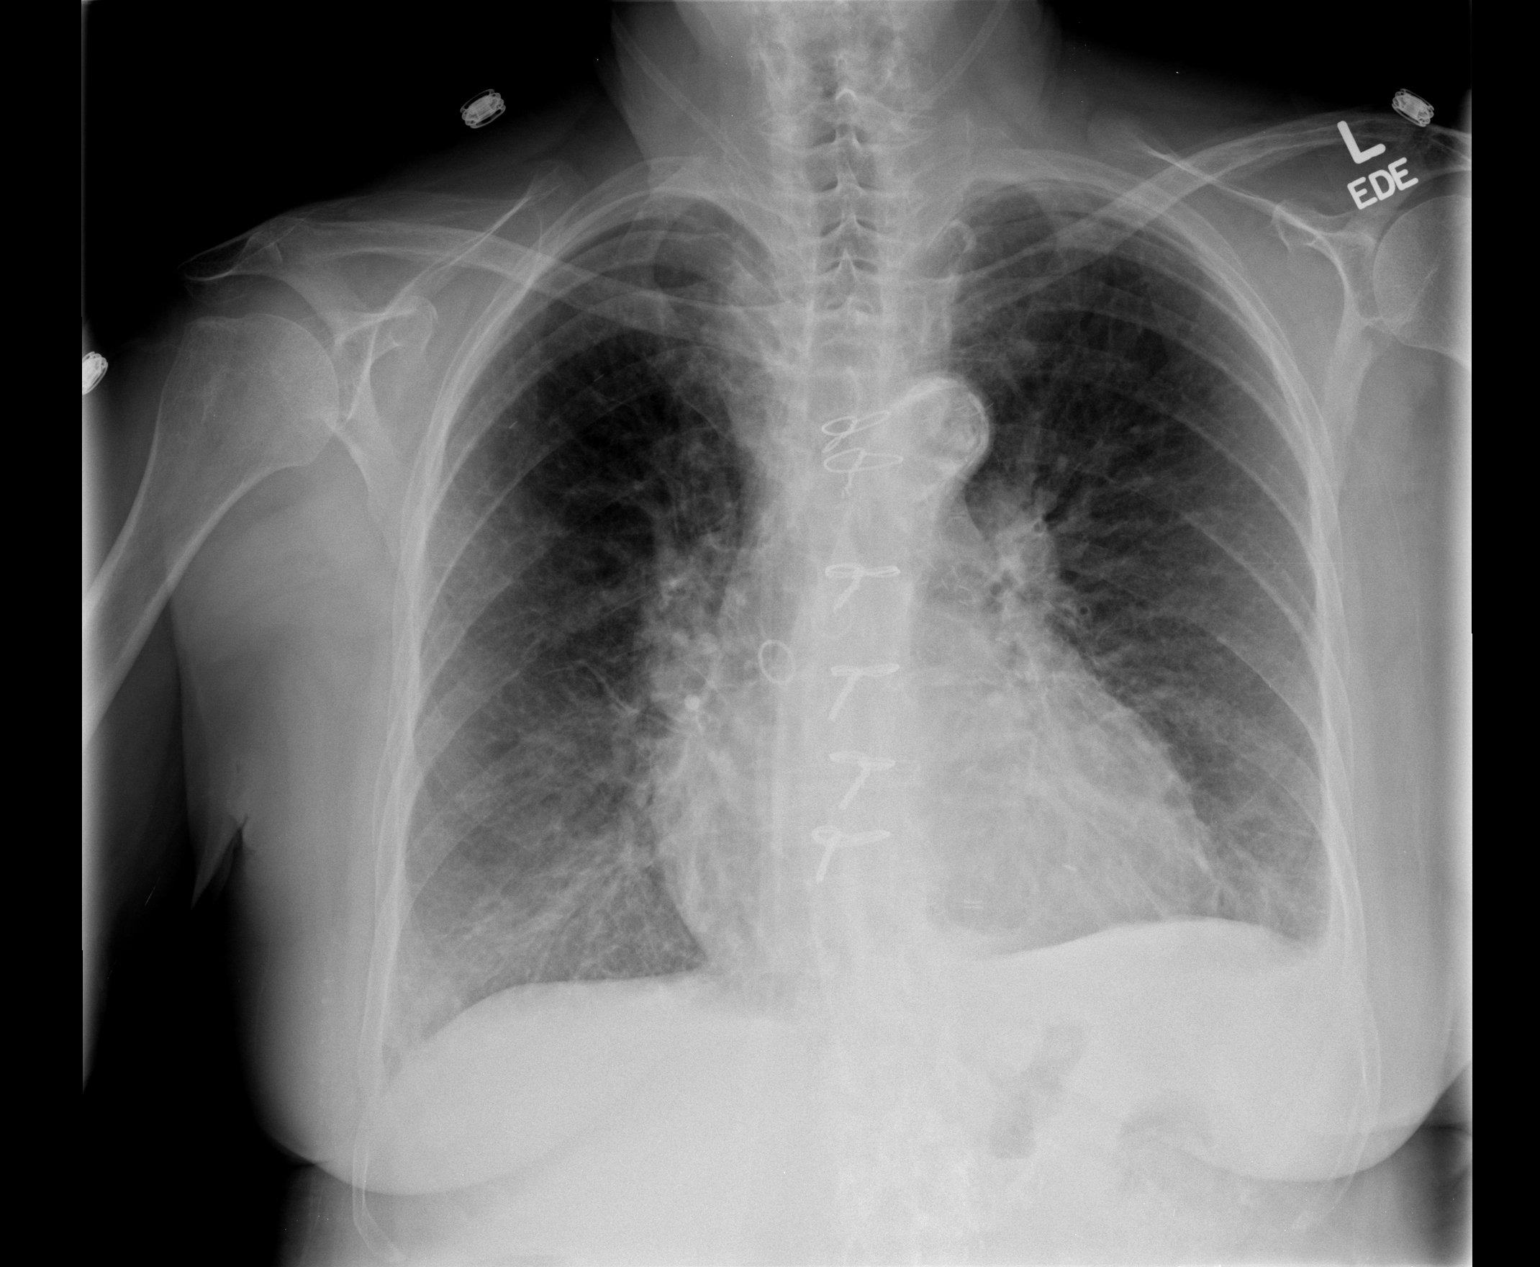

[view not recorded (2 of 2)]
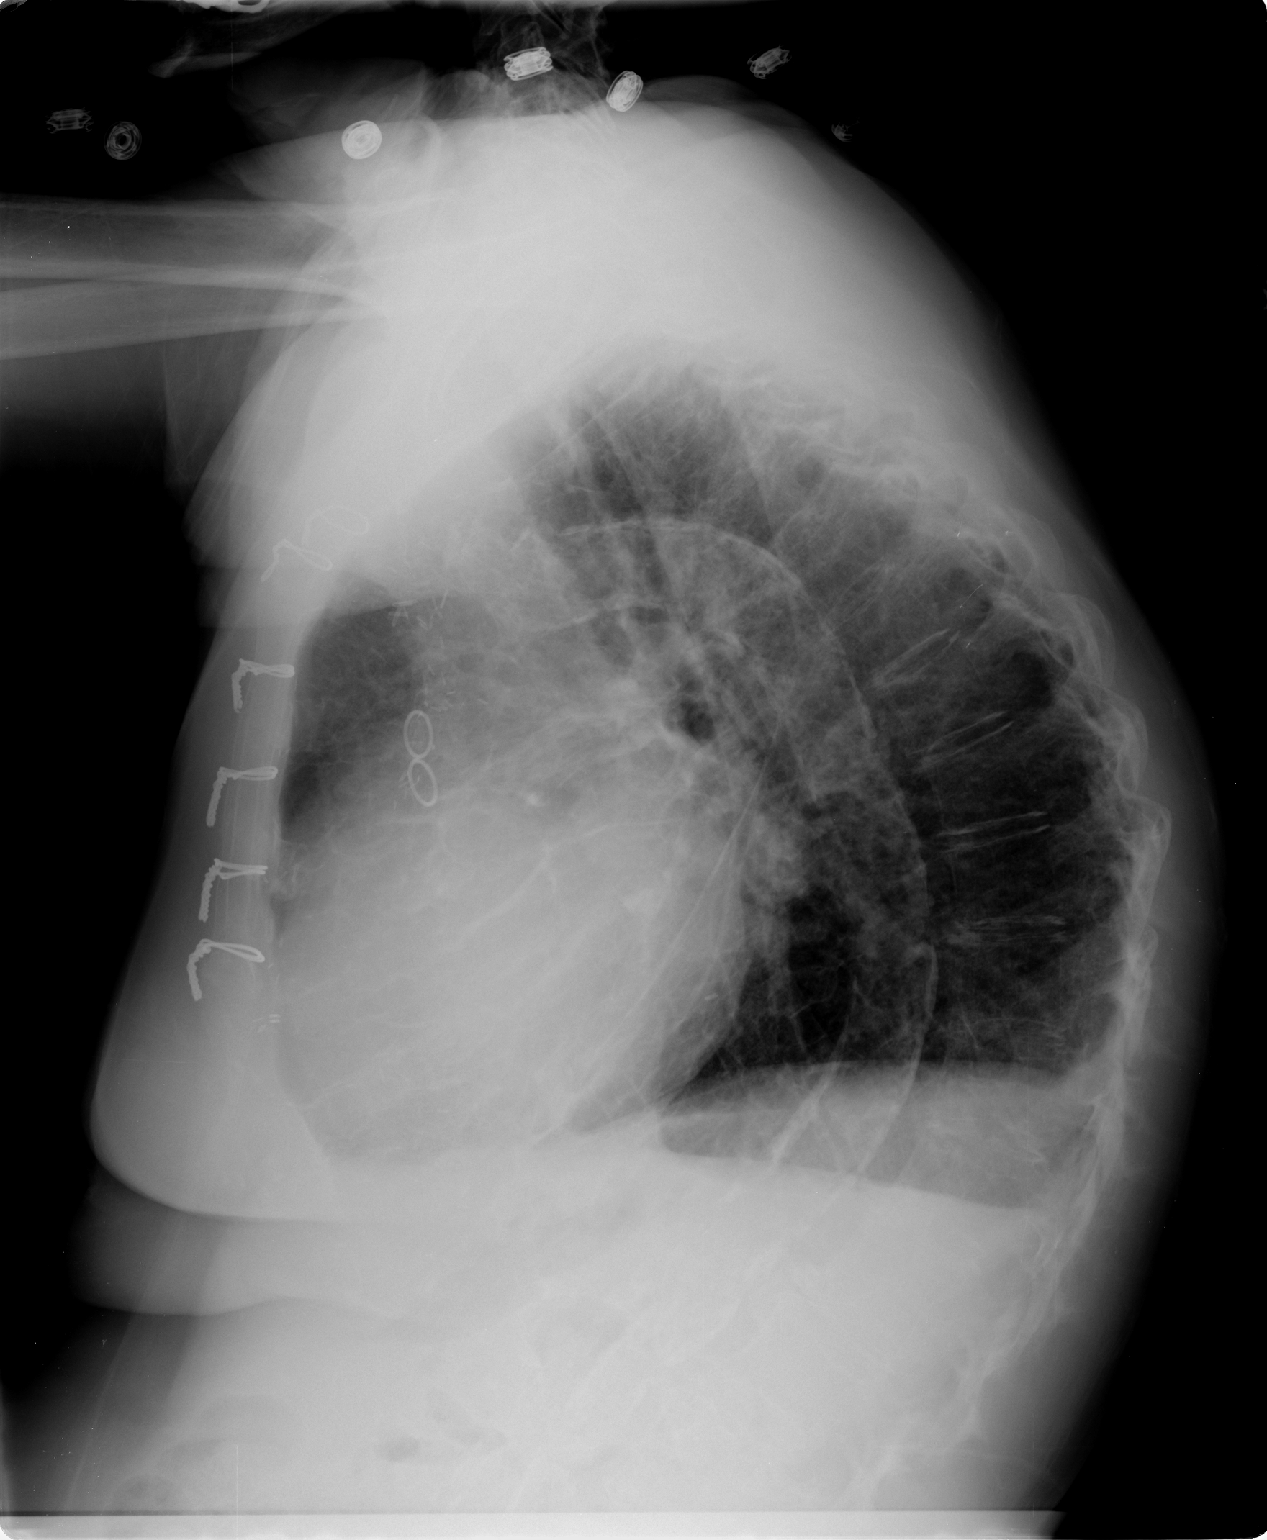

[2 of 2 positions shown; findings below may reference images not displayed]

FINDINGS: Prior CABG.  Cardiomegaly.  Diffuse chronic interstitial
prominence, less pronounced than prior study.  I suspect this
finding represents residual chronic interstitial lung disease with
resolution of superimposed edema previously.  Small left pleural
effusion present.  Mild hyperinflation of the lungs.  The aorta is
diffusely calcified and tortuous, non-aneurysmal. No acute bony
abnormality.  Diffuse degenerative changes throughout the thoracic
spine.
IMPRESSION: Cardiomegaly.  Chronic interstitial lung disease.  Small left
effusion.

Mild hyperinflation.

## 2014-04-22 ENCOUNTER — Ambulatory Visit (INDEPENDENT_AMBULATORY_CARE_PROVIDER_SITE_OTHER): Payer: Medicare Other | Admitting: *Deleted

## 2014-04-22 DIAGNOSIS — I4891 Unspecified atrial fibrillation: Secondary | ICD-10-CM

## 2014-04-22 DIAGNOSIS — Z5181 Encounter for therapeutic drug level monitoring: Secondary | ICD-10-CM

## 2014-04-22 DIAGNOSIS — I5022 Chronic systolic (congestive) heart failure: Secondary | ICD-10-CM

## 2014-04-22 LAB — POCT INR: INR: 1.9

## 2014-05-06 ENCOUNTER — Ambulatory Visit (INDEPENDENT_AMBULATORY_CARE_PROVIDER_SITE_OTHER): Payer: Medicare Other | Admitting: *Deleted

## 2014-05-06 DIAGNOSIS — I5022 Chronic systolic (congestive) heart failure: Secondary | ICD-10-CM

## 2014-05-06 DIAGNOSIS — Z5181 Encounter for therapeutic drug level monitoring: Secondary | ICD-10-CM

## 2014-05-06 DIAGNOSIS — I4891 Unspecified atrial fibrillation: Secondary | ICD-10-CM

## 2014-05-06 LAB — POCT INR: INR: 1.4

## 2014-05-13 ENCOUNTER — Ambulatory Visit (INDEPENDENT_AMBULATORY_CARE_PROVIDER_SITE_OTHER): Payer: Medicare Other | Admitting: *Deleted

## 2014-05-13 DIAGNOSIS — I4891 Unspecified atrial fibrillation: Secondary | ICD-10-CM

## 2014-05-13 DIAGNOSIS — I5022 Chronic systolic (congestive) heart failure: Secondary | ICD-10-CM

## 2014-05-13 DIAGNOSIS — Z5181 Encounter for therapeutic drug level monitoring: Secondary | ICD-10-CM

## 2014-05-13 LAB — POCT INR: INR: 2.2

## 2014-05-20 ENCOUNTER — Ambulatory Visit (INDEPENDENT_AMBULATORY_CARE_PROVIDER_SITE_OTHER): Payer: Medicare Other | Admitting: *Deleted

## 2014-05-20 DIAGNOSIS — I5022 Chronic systolic (congestive) heart failure: Secondary | ICD-10-CM

## 2014-05-20 DIAGNOSIS — Z5181 Encounter for therapeutic drug level monitoring: Secondary | ICD-10-CM

## 2014-05-20 DIAGNOSIS — I4891 Unspecified atrial fibrillation: Secondary | ICD-10-CM

## 2014-05-20 LAB — POCT INR: INR: 1.8

## 2014-05-26 ENCOUNTER — Encounter: Payer: Self-pay | Admitting: Cardiology

## 2014-05-27 ENCOUNTER — Telehealth: Payer: Self-pay | Admitting: Cardiology

## 2014-05-27 NOTE — Telephone Encounter (Signed)
Reviewed labs from [place].  INR was 3.2 on 9/3.  Pt placed on Levaquin  once daily x 7 days.  Instructed her to take 1/2 tablet of Coumadin on Friday and Saturday then continue previous dose.  Has appt to check INR on 9/11.

## 2014-05-27 NOTE — Telephone Encounter (Signed)
[name] has UTI -States that she went to the ER at [facility] and was told that her CCR is too low. She would like to talk to Coumdin nurse today.

## 2014-06-03 ENCOUNTER — Ambulatory Visit (INDEPENDENT_AMBULATORY_CARE_PROVIDER_SITE_OTHER): Payer: Medicare Other | Admitting: *Deleted

## 2014-06-03 DIAGNOSIS — I4891 Unspecified atrial fibrillation: Secondary | ICD-10-CM

## 2014-06-03 DIAGNOSIS — Z5181 Encounter for therapeutic drug level monitoring: Secondary | ICD-10-CM

## 2014-06-03 DIAGNOSIS — I5022 Chronic systolic (congestive) heart failure: Secondary | ICD-10-CM

## 2014-06-03 LAB — POCT INR: INR: 3.7

## 2014-06-06 ENCOUNTER — Encounter: Payer: Self-pay | Admitting: Cardiology

## 2014-06-09 ENCOUNTER — Other Ambulatory Visit: Payer: Self-pay | Admitting: Cardiology

## 2014-06-14 ENCOUNTER — Encounter: Payer: Self-pay | Admitting: Cardiology

## 2014-06-24 ENCOUNTER — Ambulatory Visit (INDEPENDENT_AMBULATORY_CARE_PROVIDER_SITE_OTHER): Payer: Medicare Other | Admitting: *Deleted

## 2014-06-24 DIAGNOSIS — Z5181 Encounter for therapeutic drug level monitoring: Secondary | ICD-10-CM

## 2014-06-24 DIAGNOSIS — I4891 Unspecified atrial fibrillation: Secondary | ICD-10-CM

## 2014-06-24 DIAGNOSIS — I5022 Chronic systolic (congestive) heart failure: Secondary | ICD-10-CM

## 2014-06-24 LAB — POCT INR: INR: >8

## 2014-06-24 LAB — PROTIME-INR: INR: 6.3 — AB (ref <=1.1)

## 2014-06-27 ENCOUNTER — Encounter: Payer: Self-pay | Admitting: Cardiology

## 2014-06-28 ENCOUNTER — Ambulatory Visit (INDEPENDENT_AMBULATORY_CARE_PROVIDER_SITE_OTHER): Payer: Medicare Other | Admitting: Pharmacist Clinician (PhC)/ Clinical Pharmacy Specialist

## 2014-06-28 DIAGNOSIS — I4891 Unspecified atrial fibrillation: Secondary | ICD-10-CM

## 2014-06-28 DIAGNOSIS — I5022 Chronic systolic (congestive) heart failure: Secondary | ICD-10-CM

## 2014-06-28 DIAGNOSIS — Z5181 Encounter for therapeutic drug level monitoring: Secondary | ICD-10-CM

## 2014-06-28 LAB — POCT INR: INR: 2.7

## 2014-06-30 ENCOUNTER — Telehealth: Payer: Self-pay | Admitting: *Deleted

## 2014-06-30 NOTE — Telephone Encounter (Signed)
Mrs.Jabbour  called stating that she is going to have surgery on October 16th by Dr. Channing Muttersoy.  Patient was told today by Dr. Channing Muttersoy that she will need to come off her coumdin Saturday October  10th.

## 2014-06-30 NOTE — Telephone Encounter (Signed)
OK to hold coumadin. Plan to restart as soon after the surgery as possible.

## 2014-06-30 NOTE — Telephone Encounter (Signed)
Spoke with pt.  Explained we need to get clearance from Dr. Myrtis SerKatz to hold her Coumadin.  Will forward message to him.

## 2014-07-01 NOTE — Telephone Encounter (Signed)
Pt aware of Dr. Henrietta HooverKatz's recommendations.  Appt made to recheck INR on 10/22

## 2014-07-14 ENCOUNTER — Ambulatory Visit (INDEPENDENT_AMBULATORY_CARE_PROVIDER_SITE_OTHER): Payer: Medicare Other | Admitting: *Deleted

## 2014-07-14 DIAGNOSIS — I5022 Chronic systolic (congestive) heart failure: Secondary | ICD-10-CM

## 2014-07-14 DIAGNOSIS — Z5181 Encounter for therapeutic drug level monitoring: Secondary | ICD-10-CM

## 2014-07-14 DIAGNOSIS — I4891 Unspecified atrial fibrillation: Secondary | ICD-10-CM

## 2014-07-14 LAB — POCT INR: INR: 3.1

## 2014-07-26 ENCOUNTER — Ambulatory Visit (INDEPENDENT_AMBULATORY_CARE_PROVIDER_SITE_OTHER): Payer: Medicare Other | Admitting: *Deleted

## 2014-07-26 DIAGNOSIS — I5022 Chronic systolic (congestive) heart failure: Secondary | ICD-10-CM

## 2014-07-26 DIAGNOSIS — I4891 Unspecified atrial fibrillation: Secondary | ICD-10-CM

## 2014-07-26 DIAGNOSIS — Z5181 Encounter for therapeutic drug level monitoring: Secondary | ICD-10-CM

## 2014-07-26 LAB — POCT INR: INR: 2.6

## 2014-08-09 ENCOUNTER — Encounter: Payer: Self-pay | Admitting: Cardiovascular Disease

## 2014-08-09 ENCOUNTER — Encounter: Payer: Self-pay | Admitting: Cardiology

## 2014-08-15 ENCOUNTER — Telehealth: Payer: Self-pay | Admitting: *Deleted

## 2014-08-15 NOTE — Telephone Encounter (Signed)
[facility] for 4 fx ribs.  INR was 3.8 on admission.  Pt states she did not get any coumadin in the hospital and was told to restart coumadin 2 days after discharge.  She had not restarted coumadin yet.  Told pt to restart coumadin 1mg  daily except  2mg  on Mondays and Fridays and come for INR check on 08/25/14.  Pt is taking Percocet 2 x day for pain.

## 2014-08-15 NOTE — Telephone Encounter (Signed)
Mrs. [name] was discharged from [facility] 11[facility]. States that the hospital stopped her coumdin. Needs to talk to Misty StanleyLisa about this.

## 2014-08-17 ENCOUNTER — Other Ambulatory Visit: Payer: Self-pay | Admitting: Cardiology

## 2014-08-21 ENCOUNTER — Encounter: Payer: Self-pay | Admitting: Cardiology

## 2014-08-24 ENCOUNTER — Encounter: Payer: Self-pay | Admitting: Cardiology

## 2014-08-24 LAB — PROTIME-INR: INR: 2.5 — AB (ref 0.9–1.1)

## 2014-08-25 ENCOUNTER — Other Ambulatory Visit: Payer: Self-pay | Admitting: Cardiology

## 2014-08-25 ENCOUNTER — Telehealth: Payer: Self-pay | Admitting: *Deleted

## 2014-08-25 ENCOUNTER — Ambulatory Visit (INDEPENDENT_AMBULATORY_CARE_PROVIDER_SITE_OTHER): Payer: Medicare Other | Admitting: *Deleted

## 2014-08-25 DIAGNOSIS — I5022 Chronic systolic (congestive) heart failure: Secondary | ICD-10-CM

## 2014-08-25 DIAGNOSIS — Z5181 Encounter for therapeutic drug level monitoring: Secondary | ICD-10-CM

## 2014-08-25 DIAGNOSIS — I4891 Unspecified atrial fibrillation: Secondary | ICD-10-CM

## 2014-08-25 NOTE — Telephone Encounter (Signed)
Mrs. [name] called stating that she was d/c [facility] on 08-24-14. States that they checked her coumdin on 08-24-14 it was 2.5. Mrs. Sorter states that she is too sick To come today for check.  Please advise.

## 2014-08-25 NOTE — Telephone Encounter (Signed)
See coumadin note. 

## 2014-09-01 ENCOUNTER — Encounter (HOSPITAL_COMMUNITY): Payer: Self-pay | Admitting: Vascular Surgery

## 2014-09-08 ENCOUNTER — Ambulatory Visit (INDEPENDENT_AMBULATORY_CARE_PROVIDER_SITE_OTHER): Payer: Medicare Other | Admitting: *Deleted

## 2014-09-08 DIAGNOSIS — Z5181 Encounter for therapeutic drug level monitoring: Secondary | ICD-10-CM

## 2014-09-08 DIAGNOSIS — I4891 Unspecified atrial fibrillation: Secondary | ICD-10-CM

## 2014-09-08 DIAGNOSIS — I5022 Chronic systolic (congestive) heart failure: Secondary | ICD-10-CM

## 2014-09-08 LAB — POCT INR: INR: 2

## 2014-09-29 ENCOUNTER — Ambulatory Visit (INDEPENDENT_AMBULATORY_CARE_PROVIDER_SITE_OTHER): Payer: Medicare Other | Admitting: *Deleted

## 2014-09-29 DIAGNOSIS — Z5181 Encounter for therapeutic drug level monitoring: Secondary | ICD-10-CM

## 2014-09-29 DIAGNOSIS — I5022 Chronic systolic (congestive) heart failure: Secondary | ICD-10-CM

## 2014-09-29 DIAGNOSIS — I4891 Unspecified atrial fibrillation: Secondary | ICD-10-CM

## 2014-09-29 LAB — POCT INR: INR: 2.9

## 2014-10-06 ENCOUNTER — Encounter (HOSPITAL_COMMUNITY): Payer: Self-pay | Admitting: Vascular Surgery

## 2014-10-20 ENCOUNTER — Ambulatory Visit (INDEPENDENT_AMBULATORY_CARE_PROVIDER_SITE_OTHER): Payer: Medicare Other | Admitting: *Deleted

## 2014-10-20 DIAGNOSIS — I5022 Chronic systolic (congestive) heart failure: Secondary | ICD-10-CM

## 2014-10-20 DIAGNOSIS — I4891 Unspecified atrial fibrillation: Secondary | ICD-10-CM

## 2014-10-20 DIAGNOSIS — Z5181 Encounter for therapeutic drug level monitoring: Secondary | ICD-10-CM

## 2014-10-20 LAB — POCT INR: INR: 1.6

## 2014-10-22 ENCOUNTER — Inpatient Hospital Stay (HOSPITAL_COMMUNITY): Payer: Medicare Other

## 2014-10-22 ENCOUNTER — Inpatient Hospital Stay (HOSPITAL_COMMUNITY)
Admission: EM | Admit: 2014-10-22 | Discharge: 2014-10-24 | DRG: 291 | Disposition: A | Discharge status: Home / Self Care | Payer: Medicare Other | Source: Other Acute Inpatient Hospital | Attending: Internal Medicine | Admitting: Internal Medicine

## 2014-10-22 ENCOUNTER — Encounter (HOSPITAL_COMMUNITY): Payer: Self-pay | Admitting: Nephrology

## 2014-10-22 DIAGNOSIS — I482 Chronic atrial fibrillation: Secondary | ICD-10-CM | POA: Diagnosis present

## 2014-10-22 DIAGNOSIS — K219 Gastro-esophageal reflux disease without esophagitis: Secondary | ICD-10-CM | POA: Diagnosis present

## 2014-10-22 DIAGNOSIS — I480 Paroxysmal atrial fibrillation: Secondary | ICD-10-CM

## 2014-10-22 DIAGNOSIS — Z7901 Long term (current) use of anticoagulants: Secondary | ICD-10-CM

## 2014-10-22 DIAGNOSIS — D649 Anemia, unspecified: Secondary | ICD-10-CM | POA: Diagnosis present

## 2014-10-22 DIAGNOSIS — I5022 Chronic systolic (congestive) heart failure: Secondary | ICD-10-CM

## 2014-10-22 DIAGNOSIS — J209 Acute bronchitis, unspecified: Secondary | ICD-10-CM | POA: Diagnosis present

## 2014-10-22 DIAGNOSIS — I120 Hypertensive chronic kidney disease with stage 5 chronic kidney disease or end stage renal disease: Secondary | ICD-10-CM | POA: Diagnosis present

## 2014-10-22 DIAGNOSIS — Z951 Presence of aortocoronary bypass graft: Secondary | ICD-10-CM | POA: Diagnosis not present

## 2014-10-22 DIAGNOSIS — Z880 Allergy status to penicillin: Secondary | ICD-10-CM

## 2014-10-22 DIAGNOSIS — E1165 Type 2 diabetes mellitus with hyperglycemia: Secondary | ICD-10-CM | POA: Diagnosis present

## 2014-10-22 DIAGNOSIS — E785 Hyperlipidemia, unspecified: Secondary | ICD-10-CM | POA: Diagnosis present

## 2014-10-22 DIAGNOSIS — I739 Peripheral vascular disease, unspecified: Secondary | ICD-10-CM | POA: Diagnosis present

## 2014-10-22 DIAGNOSIS — Z87891 Personal history of nicotine dependence: Secondary | ICD-10-CM

## 2014-10-22 DIAGNOSIS — E875 Hyperkalemia: Secondary | ICD-10-CM | POA: Diagnosis present

## 2014-10-22 DIAGNOSIS — Z9981 Dependence on supplemental oxygen: Secondary | ICD-10-CM | POA: Diagnosis not present

## 2014-10-22 DIAGNOSIS — I340 Nonrheumatic mitral (valve) insufficiency: Secondary | ICD-10-CM | POA: Diagnosis present

## 2014-10-22 DIAGNOSIS — I4891 Unspecified atrial fibrillation: Secondary | ICD-10-CM | POA: Diagnosis present

## 2014-10-22 DIAGNOSIS — Z888 Allergy status to other drugs, medicaments and biological substances status: Secondary | ICD-10-CM | POA: Diagnosis not present

## 2014-10-22 DIAGNOSIS — Z794 Long term (current) use of insulin: Secondary | ICD-10-CM | POA: Diagnosis not present

## 2014-10-22 DIAGNOSIS — N186 End stage renal disease: Secondary | ICD-10-CM

## 2014-10-22 DIAGNOSIS — Z85828 Personal history of other malignant neoplasm of skin: Secondary | ICD-10-CM

## 2014-10-22 DIAGNOSIS — R0602 Shortness of breath: Secondary | ICD-10-CM

## 2014-10-22 DIAGNOSIS — I255 Ischemic cardiomyopathy: Secondary | ICD-10-CM | POA: Diagnosis present

## 2014-10-22 DIAGNOSIS — F419 Anxiety disorder, unspecified: Secondary | ICD-10-CM | POA: Diagnosis present

## 2014-10-22 DIAGNOSIS — I350 Nonrheumatic aortic (valve) stenosis: Secondary | ICD-10-CM

## 2014-10-22 DIAGNOSIS — I252 Old myocardial infarction: Secondary | ICD-10-CM

## 2014-10-22 DIAGNOSIS — Z992 Dependence on renal dialysis: Secondary | ICD-10-CM

## 2014-10-22 DIAGNOSIS — M1990 Unspecified osteoarthritis, unspecified site: Secondary | ICD-10-CM | POA: Diagnosis present

## 2014-10-22 DIAGNOSIS — I2510 Atherosclerotic heart disease of native coronary artery without angina pectoris: Secondary | ICD-10-CM | POA: Diagnosis present

## 2014-10-22 DIAGNOSIS — I5023 Acute on chronic systolic (congestive) heart failure: Secondary | ICD-10-CM | POA: Diagnosis present

## 2014-10-22 DIAGNOSIS — I429 Cardiomyopathy, unspecified: Secondary | ICD-10-CM

## 2014-10-22 DIAGNOSIS — J9621 Acute and chronic respiratory failure with hypoxia: Secondary | ICD-10-CM | POA: Diagnosis present

## 2014-10-22 DIAGNOSIS — Z886 Allergy status to analgesic agent status: Secondary | ICD-10-CM

## 2014-10-22 DIAGNOSIS — I10 Essential (primary) hypertension: Secondary | ICD-10-CM

## 2014-10-22 DIAGNOSIS — J441 Chronic obstructive pulmonary disease with (acute) exacerbation: Secondary | ICD-10-CM | POA: Diagnosis present

## 2014-10-22 HISTORY — DX: Dependence on renal dialysis: N18.6

## 2014-10-22 LAB — GLUCOSE, CAPILLARY
GLUCOSE-CAPILLARY: 138 mg/dL — AB (ref 70–99)
GLUCOSE-CAPILLARY: 279 mg/dL — AB (ref 70–99)
Glucose-Capillary: 61 mg/dL — ABNORMAL LOW (ref 70–99)
Glucose-Capillary: 84 mg/dL (ref 70–99)

## 2014-10-22 LAB — COMPREHENSIVE METABOLIC PANEL
ALK PHOS: 121 U/L — AB (ref 39–117)
ALT: 14 U/L (ref 0–35)
ANION GAP: 9 (ref 5–15)
AST: 18 U/L (ref 0–37)
Albumin: 3.2 g/dL — ABNORMAL LOW (ref 3.5–5.2)
BILIRUBIN TOTAL: 0.6 mg/dL (ref 0.3–1.2)
BUN: 44 mg/dL — ABNORMAL HIGH (ref 6–23)
CO2: 26 mmol/L (ref 19–32)
CREATININE: 4.72 mg/dL — AB (ref 0.50–1.10)
Calcium: 9.5 mg/dL (ref 8.4–10.5)
Chloride: 96 mmol/L (ref 96–112)
GFR calc Af Amer: 10 mL/min — ABNORMAL LOW (ref >90)
GFR, EST NON AFRICAN AMERICAN: 8 mL/min — AB (ref >90)
GLUCOSE: 335 mg/dL — AB (ref 70–99)
Potassium: 6.7 mmol/L (ref 3.5–5.1)
Sodium: 131 mmol/L — ABNORMAL LOW (ref 135–145)
Total Protein: 6.5 g/dL (ref 6.0–8.3)

## 2014-10-22 LAB — PROTIME-INR
INR: 2.15 — ABNORMAL HIGH (ref 0.00–1.49)
Prothrombin Time: 24.2 seconds — ABNORMAL HIGH (ref 11.6–15.2)

## 2014-10-22 LAB — CBC WITH DIFFERENTIAL/PLATELET
BASOS PCT: 0 % (ref 0–1)
Basophils Absolute: 0 10*3/uL (ref 0.0–0.1)
EOS PCT: 0 % (ref 0–5)
Eosinophils Absolute: 0 10*3/uL (ref 0.0–0.7)
HCT: 37.4 % (ref 36.0–46.0)
HEMOGLOBIN: 11.1 g/dL — AB (ref 12.0–15.0)
LYMPHS PCT: 6 % — AB (ref 12–46)
Lymphs Abs: 0.2 10*3/uL — ABNORMAL LOW (ref 0.7–4.0)
MCH: 26.6 pg (ref 26.0–34.0)
MCHC: 29.7 g/dL — ABNORMAL LOW (ref 30.0–36.0)
MCV: 89.7 fL (ref 78.0–100.0)
MONO ABS: 0 10*3/uL — AB (ref 0.1–1.0)
Monocytes Relative: 1 % — ABNORMAL LOW (ref 3–12)
NEUTROS ABS: 3.9 10*3/uL (ref 1.7–7.7)
Neutrophils Relative %: 93 % — ABNORMAL HIGH (ref 43–77)
PLATELETS: 159 10*3/uL (ref 150–400)
RBC: 4.17 MIL/uL (ref 3.87–5.11)
RDW: 19 % — AB (ref 11.5–15.5)
WBC: 4.2 10*3/uL (ref 4.0–10.5)

## 2014-10-22 MED ORDER — HYDROCODONE-ACETAMINOPHEN 7.5-325 MG PO TABS
1.0000 | ORAL_TABLET | Freq: Four times a day (QID) | ORAL | Status: DC | PRN
  Administered 2014-10-22 – 2014-10-23 (×4): 1 via ORAL
  Filled 2014-10-22 (×4): qty 1

## 2014-10-22 MED ORDER — INSULIN ASPART 100 UNIT/ML ~~LOC~~ SOLN
0.0000 [IU] | Freq: Three times a day (TID) | SUBCUTANEOUS | Status: DC
  Administered 2014-10-22: 2 [IU] via SUBCUTANEOUS
  Administered 2014-10-22: 8 [IU] via SUBCUTANEOUS
  Administered 2014-10-23 (×2): 5 [IU] via SUBCUTANEOUS
  Administered 2014-10-24: 8 [IU] via SUBCUTANEOUS
  Administered 2014-10-24: 2 [IU] via SUBCUTANEOUS

## 2014-10-22 MED ORDER — WARFARIN SODIUM 1 MG PO TABS
1.0000 mg | ORAL_TABLET | Freq: Once | ORAL | Status: AC
  Administered 2014-10-22: 1 mg via ORAL
  Filled 2014-10-22: qty 1

## 2014-10-22 MED ORDER — SEVELAMER CARBONATE 800 MG PO TABS
3200.0000 mg | ORAL_TABLET | Freq: Three times a day (TID) | ORAL | Status: DC
  Administered 2014-10-22 – 2014-10-24 (×6): 3200 mg via ORAL
  Filled 2014-10-22 (×10): qty 4

## 2014-10-22 MED ORDER — NA FERRIC GLUC CPLX IN SUCROSE 12.5 MG/ML IV SOLN
125.0000 mg | INTRAVENOUS | Status: DC
  Administered 2014-10-22: 125 mg via INTRAVENOUS
  Filled 2014-10-22 (×2): qty 10

## 2014-10-22 MED ORDER — LIDOCAINE-PRILOCAINE 2.5-2.5 % EX CREA: 1.0000 "application " | TOPICAL_CREAM | CUTANEOUS | Status: DC | PRN

## 2014-10-22 MED ORDER — DARBEPOETIN ALFA 100 MCG/0.5ML IJ SOSY: 100.0000 ug | PREFILLED_SYRINGE | INTRAMUSCULAR | Status: DC

## 2014-10-22 MED ORDER — IPRATROPIUM-ALBUTEROL 0.5-2.5 (3) MG/3ML IN SOLN
3.0000 mL | RESPIRATORY_TRACT | Status: DC
  Administered 2014-10-22 (×4): 3 mL via RESPIRATORY_TRACT
  Filled 2014-10-22 (×2): qty 3

## 2014-10-22 MED ORDER — L-METHYLFOLATE-B6-B12 3-35-2 MG PO TABS
1.0000 | ORAL_TABLET | Freq: Every day | ORAL | Status: DC
  Administered 2014-10-23 – 2014-10-24 (×2): 1 via ORAL
  Filled 2014-10-22 (×3): qty 1

## 2014-10-22 MED ORDER — CEFTRIAXONE SODIUM IN DEXTROSE 20 MG/ML IV SOLN: 1.0000 g | INTRAVENOUS | Status: DC

## 2014-10-22 MED ORDER — DOXERCALCIFEROL 4 MCG/2ML IV SOLN
3.5000 ug | INTRAVENOUS | Status: DC
  Administered 2014-10-22: 3.5 ug via INTRAVENOUS
  Filled 2014-10-22: qty 2

## 2014-10-22 MED ORDER — PRAVASTATIN SODIUM 40 MG PO TABS
40.0000 mg | ORAL_TABLET | Freq: Every day | ORAL | Status: DC
  Administered 2014-10-22 – 2014-10-24 (×3): 40 mg via ORAL
  Filled 2014-10-22 (×3): qty 1

## 2014-10-22 MED ORDER — INSULIN ASPART 100 UNIT/ML ~~LOC~~ SOLN
0.0000 [IU] | Freq: Every day | SUBCUTANEOUS | Status: DC
  Administered 2014-10-23: 3 [IU] via SUBCUTANEOUS

## 2014-10-22 MED ORDER — INSULIN ASPART 100 UNIT/ML ~~LOC~~ SOLN
10.0000 [IU] | Freq: Once | SUBCUTANEOUS | Status: AC
  Administered 2014-10-22: 10 [IU] via INTRAVENOUS

## 2014-10-22 MED ORDER — ACETAMINOPHEN 325 MG PO TABS: 650.0000 mg | ORAL_TABLET | Freq: Four times a day (QID) | ORAL | Status: DC | PRN

## 2014-10-22 MED ORDER — SODIUM CHLORIDE 0.9 % IV SOLN
1.0000 g | Freq: Once | INTRAVENOUS | Status: AC
  Administered 2014-10-22: 1 g via INTRAVENOUS
  Filled 2014-10-22: qty 10

## 2014-10-22 MED ORDER — BUDESONIDE 0.25 MG/2ML IN SUSP
0.2500 mg | Freq: Two times a day (BID) | RESPIRATORY_TRACT | Status: DC
  Administered 2014-10-22 – 2014-10-23 (×3): 0.25 mg via RESPIRATORY_TRACT
  Filled 2014-10-22 (×8): qty 2

## 2014-10-22 MED ORDER — INSULIN NPH (HUMAN) (ISOPHANE) 100 UNIT/ML ~~LOC~~ SUSP
8.0000 [IU] | Freq: Every day | SUBCUTANEOUS | Status: DC
  Filled 2014-10-22: qty 10

## 2014-10-22 MED ORDER — WARFARIN - PHARMACIST DOSING INPATIENT: Freq: Every day | Status: DC

## 2014-10-22 MED ORDER — SODIUM BICARBONATE 8.4 % IV SOLN
50.0000 meq | Freq: Once | INTRAVENOUS | Status: DC
  Filled 2014-10-22: qty 50

## 2014-10-22 MED ORDER — PANTOPRAZOLE SODIUM 20 MG PO TBEC
20.0000 mg | DELAYED_RELEASE_TABLET | Freq: Every day | ORAL | Status: DC
  Administered 2014-10-22 – 2014-10-24 (×3): 20 mg via ORAL
  Filled 2014-10-22 (×3): qty 1

## 2014-10-22 MED ORDER — ALPRAZOLAM 0.5 MG PO TABS
0.5000 mg | ORAL_TABLET | Freq: Three times a day (TID) | ORAL | Status: DC | PRN
  Administered 2014-10-22 – 2014-10-24 (×5): 0.5 mg via ORAL
  Filled 2014-10-22 (×5): qty 1

## 2014-10-22 MED ORDER — DOXERCALCIFEROL 4 MCG/2ML IV SOLN
INTRAVENOUS | Status: AC
Start: 2014-10-22 — End: 2014-10-22
  Filled 2014-10-22: qty 2

## 2014-10-22 MED ORDER — DEXTROSE 5 % IV SOLN: 500.0000 mg | INTRAVENOUS | Status: DC

## 2014-10-22 MED ORDER — AMIODARONE HCL 100 MG PO TABS
100.0000 mg | ORAL_TABLET | Freq: Every day | ORAL | Status: DC
  Administered 2014-10-22 – 2014-10-24 (×3): 100 mg via ORAL
  Filled 2014-10-22 (×3): qty 1

## 2014-10-22 MED ORDER — ACETAMINOPHEN 650 MG RE SUPP: 650.0000 mg | Freq: Four times a day (QID) | RECTAL | Status: DC | PRN

## 2014-10-22 MED ORDER — CINACALCET HCL 30 MG PO TABS
30.0000 mg | ORAL_TABLET | Freq: Every day | ORAL | Status: DC
  Administered 2014-10-23 – 2014-10-24 (×2): 30 mg via ORAL
  Filled 2014-10-22 (×4): qty 1

## 2014-10-22 MED ORDER — ALBUTEROL SULFATE HFA 108 (90 BASE) MCG/ACT IN AERS: 2.0000 | INHALATION_SPRAY | Freq: Two times a day (BID) | RESPIRATORY_TRACT | Status: DC | PRN

## 2014-10-22 MED ORDER — ONDANSETRON HCL 4 MG/2ML IJ SOLN: 4.0000 mg | Freq: Four times a day (QID) | INTRAMUSCULAR | Status: DC | PRN

## 2014-10-22 MED ORDER — SODIUM CHLORIDE 0.9 % IJ SOLN
3.0000 mL | Freq: Two times a day (BID) | INTRAMUSCULAR | Status: DC
  Administered 2014-10-22: 3 mL via INTRAVENOUS

## 2014-10-22 MED ORDER — ALBUTEROL SULFATE (2.5 MG/3ML) 0.083% IN NEBU
2.5000 mg | INHALATION_SOLUTION | Freq: Four times a day (QID) | RESPIRATORY_TRACT | Status: DC | PRN
  Administered 2014-10-22: 2.5 mg via RESPIRATORY_TRACT
  Filled 2014-10-22: qty 3

## 2014-10-22 MED ORDER — DEXTROSE 50 % IV SOLN
1.0000 | Freq: Once | INTRAVENOUS | Status: AC
  Administered 2014-10-22: 50 mL via INTRAVENOUS
  Filled 2014-10-22: qty 50

## 2014-10-22 MED ORDER — SODIUM CHLORIDE 0.9 % IJ SOLN
10.0000 mL | INTRAMUSCULAR | Status: DC | PRN
  Administered 2014-10-23 – 2014-10-24 (×3): 10 mL
  Filled 2014-10-22 (×3): qty 40

## 2014-10-22 MED ORDER — SEVELAMER CARBONATE 800 MG PO TABS
1600.0000 mg | ORAL_TABLET | ORAL | Status: DC | PRN
  Filled 2014-10-22: qty 2

## 2014-10-22 MED ORDER — DEXTROSE 5 % IV SOLN
500.0000 mg | INTRAVENOUS | Status: DC
  Administered 2014-10-22: 500 mg via INTRAVENOUS
  Filled 2014-10-22 (×2): qty 500

## 2014-10-22 MED ORDER — ONDANSETRON HCL 4 MG PO TABS: 4.0000 mg | ORAL_TABLET | Freq: Four times a day (QID) | ORAL | Status: DC | PRN

## 2014-10-22 NOTE — H&P (Signed)
[facility] History and Physical  Patient: [name]  MRN: [id number]  DOB: 09/12/1941  DOS: the patient was seen and examined on 10/22/2014 PCP: Selinda FlavinHOWARD, KEVIN, MD  Chief Complaint: Shortness of breath  HPI: [name] OysterBetty C Tuminello is a 74 y.o. female with Past medical history of coronary artery disease status post CABG, ischemic cardiomyopathy, mitral regurgitation, diabetes mellitus, chronic A. fib on amiodarone and anticoagulation, hypertension, peripheral vascular disease, COPD, diabetes mellitus. The patient is presenting with complaints of shortness of breath. She goes to dialysis Tuesday Thursday Saturday. After her dialysis on Thursday her dry weight was 45 kg. After going home on Thursday she progressively started worsening shortness of breath and was using her inhaler throughout the day. There was no hypoxia but she continues to have worsening shortness of breath and therefore was taken to ER. She was transferred here as she required urgent dialysis. At the time of my evaluation patient does not have any other complaint other than shortness of breath. Denies any fever chills cough chest pain nausea vomiting abdominal pain diarrhea. Patient was on Lasix 3 months ago which was stopped. Patient still urinates minimally. Denies any focal deficit.  The patient is coming from home. And at her baseline independent for most of her ADL.  Review of Systems: as mentioned in the history of present illness.  A Comprehensive review of the other systems is negative.  Past Medical History  Diagnosis Date  . Mitral regurgitation     Mild-to-moderate, echo, January, 2010 / moderate, e eccentric jet, echo, March, 2012, moderate to moderately severe by bedside echocardiogram December 2012  . Ejection fraction     Ejection fraction 25-30% January, 2013  . Dyslipidemia   . CAD (coronary artery disease)     Nuclear, January, 2010, no ischemia  . Hx of CABG     2007  . Cardiomyopathy      Ischemic status post coronary bypass grafting  . Diabetes mellitus   . Anemia     Chronic  . PAD (peripheral artery disease)     Right axillofemoral bypass, Dr. Arbie CookeyEarly status post redo right axillofemoral bypass August 2012  . Aspirin allergy     Takes Plavix  . Hypertension   . Carotid artery disease     Doppler, May, 2012, 50-69% R. ICA, 50-69% LICA  . Hyperlipidemia   . Arthritis   . Anxiety   . CHF (congestive heart failure) 03/23/12  . H/O amiodarone therapy     Started July, 2013  . PONV (postoperative nausea and vomiting)   . Atrial fibrillation     New onset July, 2013  . GERD (gastroesophageal reflux disease)   . ESRD (end stage renal disease) on dialysis     Dialysis, right arm shunt  / PCI tissue, interventional radiology September, 2010 / temporary catheter left subclavian, new AV fistula right arm maturing March, 201 t2  . ESRD     dialysis Tu,Thur,Sat  . COPD (chronic obstructive pulmonary disease)     uses oxygen 2 liters 24/7  . Fatigue     Fatigue and sleepiness since hospitalization December, 2013  . Myocardial infarction    Past Surgical History  Procedure Laterality Date  . Coronary artery bypass graft   07/27/2006    Salvatore DecentSteven C. Dorris FetchHendrickson, M.D  . Abdominal hysterectomy    . Laparoscopic salpingoopherectomy    . Appendectomy    . Fem-fem bypass  2003    left to right   . Right femoral bypass  2004  . Basal cell carcinoma excision  2004    REMOVAL FROM NOSE  . Arteriovenous graft placement  right  11-30-2010,  05-20-2011  . Femoral-femoral bypass graft  06/12/2012    Procedure: BYPASS GRAFT FEMORAL-FEMORAL ARTERY;  Surgeon: Larina Earthly, MD;  Location[facility] OR;  Service: Vascular;  Laterality: N/A;  Right to left femoral to femoral bypass  . Abdominal aortagram N/A 04/22/2012    Procedure: ABDOMINAL AORTAGRAM;  Surgeon: Larina Earthly, MD;  Location: [facility] CATH LAB;  Service: Cardiovascular;  Laterality: N/A;   Social History:  reports that she quit smoking  about 9 years ago. Her smoking use included Cigarettes. She has a 100 pack-year smoking history. She quit smokeless tobacco use about 9 years ago. She reports that she does not drink alcohol or use illicit drugs.  Allergies  Allergen Reactions  . Aspirin Anaphylaxis  . Penicillins Anaphylaxis  . Septra [Sulfamethoxazole-Trimethoprim] Nausea And Vomiting  . Nsaids Itching    Family History  Problem Relation Age of Onset  . Diabetes Mother   . Cancer Mother   . Kidney disease Father   . Diabetes Father   . Heart disease Father   . Hypertension Father   . Cancer Sister   . Diabetes Sister   . Diabetes Sister   . Cancer Brother   . Diabetes Brother   . Cancer Brother   . Diabetes Brother   . Diabetes Daughter   . Diabetes Son     Prior to Admission medications   Medication Sig Start Date End Date Taking? Authorizing Provider  albuterol (PROVENTIL HFA;VENTOLIN HFA) 108 (90 BASE) MCG/ACT inhaler Inhale 2 puffs into the lungs 2 (two) times daily as needed for wheezing or shortness of breath.    Historical Provider, MD  albuterol (PROVENTIL) (2.5 MG/3ML) 0.083% nebulizer solution Take 2.5 mg by nebulization every 6 (six) hours as needed. For shortness of breath    Historical Provider, MD  ALPRAZolam Prudy Feeler) 0.5 MG tablet Take 0.5 mg by mouth 3 (three) times daily as needed. For anxiety     Historical Provider, MD  amiodarone (PACERONE) 200 MG tablet Take 100 mg by mouth daily.    Historical Provider, MD  cinacalcet (SENSIPAR) 30 MG tablet Take 30 mg by mouth daily.    Historical Provider, MD  folic acid-vitamin b complex-vitamin c-selenium-zinc (DIALYVITE) 3 MG TABS Take 1 tablet by mouth daily.      Historical Provider, MD  HYDROcodone-acetaminophen (NORCO) 7.5-325 MG per tablet Take 1 tablet by mouth every 6 (six) hours as needed for pain.    Historical Provider, MD  insulin NPH (HUMULIN N,NOVOLIN N) 100 UNIT/ML injection Inject 8 Units into the skin at bedtime. 40 units is the  morning and 10 units every evening    Historical Provider, MD  lidocaine-prilocaine (EMLA) cream Apply 1 application topically once. Prior to dialysis on Tues, Thurs, and Sat.    Historical Provider, MD  pantoprazole (PROTONIX) 20 MG tablet Take 20 mg by mouth daily.    Historical Provider, MD  pravastatin (PRAVACHOL) 40 MG tablet TAKE 1 BY MOUTH DAILY 06/09/14   Luis Abed, MD  sevelamer (RENVELA) 800 MG tablet Take 1,600-3,200 mg by mouth See admin instructions. Take 4 tablets with meals and 2 tablets with snacks.    Historical Provider, MD  warfarin (COUMADIN) 2 MG tablet TAKE ONE TABLET BY MOUTH DAILY OR AS DIRECTED BY COUMADIN CLINIC 08/25/14   Luis Abed, MD    Physical Exam:  Filed Vitals:   10/22/14 0319  BP: 146/49  Pulse: 90  Temp: 98.5 F (36.9 C)  TempSrc: Oral  Resp: 18  Height:  (1.6 m)  Weight: 45.269 kg (99 lb 12.8 oz)  SpO2: 94%    General: Alert, Awake and Oriented to Time, Place and Person. Appear in mild distress Eyes: PERRL ENT: Oral Mucosa clear moist. Neck: no JVD Cardiovascular: S1 and S2 Present, no Murmur, Peripheral Pulses Present Respiratory: Bilateral Air entry equal and Decreased, basal Crackles, no wheezes Abdomen: Bowel Sound present, Soft and non tender Skin: no Rash Extremities: no Pedal edema, no calf tenderness Neurologic: Grossly no focal neuro deficit.  Labs on Admission:  CBC: No results for input(s): WBC, NEUTROABS, HGB, HCT, MCV, PLT in the last 168 hours.  CMP     Component Value Date/Time   NA 131* 10/22/2014 0333   K 6.7* 10/22/2014 0333   CL 96 10/22/2014 0333   CO2 26 10/22/2014 0333   GLUCOSE 335* 10/22/2014 0333   BUN 44* 10/22/2014 0333   CREATININE 4.72* 10/22/2014 0333   CALCIUM 9.5 10/22/2014 0333   PROT 6.5 10/22/2014 0333   ALBUMIN 3.2* 10/22/2014 0333   AST 18 10/22/2014 0333   ALT 14 10/22/2014 0333   ALKPHOS 121* 10/22/2014 0333   BILITOT 0.6 10/22/2014 0333   GFRNONAA 8* 10/22/2014 0333   GFRAA  10* 10/22/2014 0333    No results for input(s): LIPASE, AMYLASE in the last 168 hours.  No results for input(s): CKTOTAL, CKMB, CKMBINDEX, TROPONINI in the last 168 hours. BNP (last 3 results) No results for input(s): PROBNP in the last 8760 hours.  Radiological Exams on Admission: No results found.  EKG: Independently reviewed. normal sinus rhythm, nonspecific ST and T waves changes.  Assessment/Plan Principal Problem:   ESRD (end stage renal disease) on dialysis Active Problems:   CAD (coronary artery disease)   Hx of CABG   Anemia   Hypertension   Chronic systolic heart failure   Cardiomyopathy   Atrial fibrillation   Aortic stenosis   On home O2   Hyperkalemia   1. ESRD (end stage renal disease) on dialysis Volume overload,  Acute on chronic systolic heart failure.  The patient is presenting with onset of shortness of breath. She is on anticoagulation and therefore possibility of PE is less likely. Her chest x-ray has been read as congestion. She had significantly elevated BNP. With this nephrology has been consulted who will be following up with the patient and arranging for dialysis for the patient. With her hyperkalemia she will be receiving sodium bicarbonate and insulin and dextrose. She will remain on renal diet. Monitor her on telemetry.  2. History of CAD CABG. Continuing current medication.  3. History of A. fib. Continuing amiodarone and warfarin per pharmacy.  4. Diabetes mellitus. Continuing insulin and continuing sliding scale.  Advance goals of care discussion: Full code   Consults: Nephrology  DVT Prophylaxis: chronic anticoagulation Nutrition: Renal diet  Family Communication: Family was present at bedside, opportunity was given to ask question and all questions were answered satisfactorily at the time of interview. Disposition: Admitted to inpatient in telemetry unit.  Author: Lynden Oxford, MD [facility] Pager:  [phone] 10/22/2014, 6:36 AM    If 7PM-7AM, please contact night-coverage [email or web address] Password [email or web address]

## 2014-10-22 NOTE — Consult Note (Signed)
Renal Service Consult Note [facility]  [name] 10/22/2014 [name] KrabbeSCHERTZ,[name] Requesting Physician:  Dr [name]  Reason for Consult:  ESRD pt with hyperkalemia and SOB HPI: The patient is a 74 y.o. year-old with hx of PVD, ESRD on HD, COPD, heavy smoker, ischemic CM hx CABG presented to outside facility with SOB.  Transfer[facility] to [facility]. [facility]. Staff there says patient does not tolerate more than 1.5 - 2kg off per treatment due to cramping and/or BP drops into the 80's and 90's with symptoms.    Patient is visibly SOB.  +cough, no fever, no CP, +orthopnea.  Family says she has been having frequents flare ups of her "heart failure" with multiple visits to the hospital in the last several months.  Patient denies abd pain, LE swelling, jt pain, rash or confusion.  Fluid goes in "my belly" when she has extra fluid.     Chart review: 5/03 - fempop bypass graft RLE for ischemia, allergy to PCN and ASA, DJD of spine, DM type 1, HL 7/03 - infected wound after R fem pop > underwent removal of infected bypass graft RLE with bilat fem vein patch angioplasties.  Rx IV vanc and cx's grew grp B strep.   7/03 - right axillofemoral bypass Goretex graft for RLE ischemia 11/07 - acute MI / NSTEMI, had 3V CAD and went for CABG x 4 on 07/27/06. Recovered reasonably well. 1/10 - SOB , acute on chron syst CHF class III, EF 40% by echo, HTN urgency, pulm edema rx with HD, ESRD on HD 2/10 - SOB , CHF flare, rx with HD. EF 50-55%. Rx acei and Toprol.  COPD heavy smoker 8/12 - redo of R axillary to femoral bypass graft by VVS for RLE ischemia. ESRD on HD 11/12 - SOB, EKG changes, orthopnea, CXR neg > admit for ACS rule out > CE's neg, EKG changes attributed to her ischemic CM. EF was 25-30%. Rx coreg, Acei. Anemia rx with EPO.   9/13 - red and painful L foot > admit for R to L fem-fem bypass by [facility]   ROS  no visual chg  Past Medical History  Past  Medical History  Diagnosis Date  . Mitral regurgitation     Mild-to-moderate, echo, January, 2010 / moderate, e eccentric jet, echo, March, 2012, moderate to moderately severe by bedside echocardiogram December 2012  . Ejection fraction     Ejection fraction 25-30% January, 2013  . Dyslipidemia   . CAD (coronary artery disease)     Nuclear, January, 2010, no ischemia  . Hx of CABG     2007  . Cardiomyopathy     Ischemic status post coronary bypass grafting  . Diabetes mellitus   . Anemia     Chronic  . PAD (peripheral artery disease)     Right axillofemoral bypass, Dr. Arbie CookeyEarly status post redo right axillofemoral bypass August 2012  . Aspirin allergy     Takes Plavix  . Hypertension   . Carotid artery disease     Doppler, May, 2012, 50-69% R. ICA, 50-69% LICA  . Hyperlipidemia   . Arthritis   . Anxiety   . CHF (congestive heart failure) 03/23/12  . H/O amiodarone therapy     Started July, 2013  . PONV (postoperative nausea and vomiting)   . Atrial fibrillation     New onset July, 2013  . GERD (gastroesophageal reflux disease)   . ESRD (end  stage renal disease) on dialysis     Dialysis, right arm shunt  / PCI tissue, interventional radiology September, 2010 / temporary catheter left subclavian, new AV fistula right arm maturing March, 201 t2  . ESRD     dialysis Tu,Thur,Sat  . COPD (chronic obstructive pulmonary disease)     uses oxygen 2 liters 24/7  . Fatigue     Fatigue and sleepiness since hospitalization December, 2013  . Myocardial infarction   . ESRD on hemodialysis 10/22/2014    Started dialysis in 2010, cause of ESRD was DM.  She goes to [facility] in [place] on TTS schedule.  Right upper arm AVF as of Jan 2016.      Past Surgical History  Past Surgical History  Procedure Laterality Date  . Coronary artery bypass graft   07/27/2006    [name]  . Abdominal hysterectomy    . Laparoscopic salpingoopherectomy    . Appendectomy    . Fem-fem bypass   2003    left to right   . Right femoral bypass  2004  . Basal cell carcinoma excision  2004    REMOVAL FROM NOSE  . Arteriovenous graft placement  right  11-30-2010,  05-20-2011  . Femoral-femoral bypass graft  06/12/2012    Procedure: BYPASS GRAFT FEMORAL-FEMORAL ARTERY;  Surgeon: [name];  Location: [facility] OR;  Service: Vascular;  Laterality: N/A;  Right to left femoral to femoral bypass  . Abdominal aortagram N/A 04/22/2012    Procedure: ABDOMINAL AORTAGRAM;  Surgeon: [name];  Location: [facility] CATH LAB;  Service: Cardiovascular;  Laterality: N/A;   Family History  Family History  Problem Relation Age of Onset  . Diabetes Mother   . Cancer Mother   . Kidney disease Father   . Diabetes Father   . Heart disease Father   . Hypertension Father   . Cancer Sister   . Diabetes Sister   . Diabetes Sister   . Cancer Brother   . Diabetes Brother   . Cancer Brother   . Diabetes Brother   . Diabetes Daughter   . Diabetes Son    Social History  reports that she quit smoking about 9 years ago. Her smoking use included Cigarettes. She has a 100 pack-year smoking history. She quit smokeless tobacco use about 9 years ago. She reports that she does not drink alcohol or use illicit drugs. Allergies  Allergies  Allergen Reactions  . Aspirin Anaphylaxis  . Penicillins Anaphylaxis  . Septra [Sulfamethoxazole-Trimethoprim] Nausea And Vomiting  . Nsaids Itching   Home medications Prior to Admission medications   Medication Sig Start Date End Date Taking? Authorizing Provider  albuterol (PROVENTIL HFA;VENTOLIN HFA) 108 (90 BASE) MCG/ACT inhaler Inhale 2 puffs into the lungs 2 (two) times daily as needed for wheezing or shortness of breath.    Historical Provider, [name]  albuterol (PROVENTIL) (2.5 MG/3ML) 0.083% nebulizer solution Take 2.5 mg by nebulization every 6 (six) hours as needed. For shortness of breath    Historical Provider, [name]  ALPRAZolam Prudy Feeler) 0.5 MG tablet Take 0.5 mg by  mouth 3 (three) times daily as needed. For anxiety     Historical Provider, [name]  amiodarone (PACERONE) 200 MG tablet Take 100 mg by mouth daily.    Historical Provider, [name]  cinacalcet (SENSIPAR) 30 MG tablet Take 30 mg by mouth daily.    Historical Provider, [name]  folic acid-vitamin b complex-vitamin c-selenium-zinc (DIALYVITE) 3 MG TABS Take 1  tablet by mouth daily.      Historical Provider, [name]  HYDROcodone-acetaminophen (NORCO) 7.5-325 MG per tablet Take 1 tablet by mouth every 6 (six) hours as needed for pain.    Historical Provider, [name]  insulin NPH (HUMULIN N,NOVOLIN N) 100 UNIT/ML injection Inject 8 Units into the skin at bedtime. 40 units is the morning and 10 units every evening    Historical Provider, [name]  lidocaine-prilocaine (EMLA) cream Apply 1 application topically once. Prior to dialysis on Tues, Thurs, and Sat.    Historical Provider, [name]  pantoprazole (PROTONIX) 20 MG tablet Take 20 mg by mouth daily.    Historical Provider, [name]  pravastatin (PRAVACHOL) 40 MG tablet TAKE 1 BY MOUTH DAILY 06/09/14   Luis Abed, [name]  sevelamer (RENVELA) 800 MG tablet Take 1,600-3,200 mg by mouth See admin instructions. Take 4 tablets with meals and 2 tablets with snacks.    Historical Provider, [name]  warfarin (COUMADIN) 2 MG tablet TAKE ONE TABLET BY MOUTH DAILY OR AS DIRECTED BY COUMADIN CLINIC 08/25/14   Luis Abed, [name]   Liver Function Tests  Recent Labs Lab 10/22/14 0333  AST 18  ALT 14  ALKPHOS 121*  BILITOT 0.6  PROT 6.5  ALBUMIN 3.2*   No results for input(s): LIPASE, AMYLASE in the last 168 hours. CBC  Recent Labs Lab 10/22/14 0333  WBC 4.2  NEUTROABS 3.9  HGB 11.1*  HCT 37.4  MCV 89.7  PLT 159   Basic Metabolic Panel  Recent Labs Lab 10/22/14 0333  NA 131*  K 6.7*  CL 96  CO2 26  GLUCOSE 335*  BUN 44*  CREATININE 4.72*  CALCIUM 9.5    Filed Vitals:   10/22/14 0319 10/22/14 0909 10/22/14 0930  BP: 146/49 135/54 119/53  Pulse: 90 86 83  Temp: 98.5 F (36.9 C)  98.5 F (36.9 C)   TempSrc: Oral Oral   Resp: Height:  (1.6 m)    Weight: 45.269 kg (99 lb 12.8 oz) 47.2 kg (104 lb 0.9 oz)   SpO2: 94% 98%    Exam Elderly pleasant WF, mod resp distress, +using acc muscles a bit No rash, cyanosis or gangrene Sclera anicteric, throat clear No jvd Chest mod dec'[name] air movement throughout, no wheezing, bilat insp rales L > R RRR 2/6 SEM RUSB, no rub or gallop Abd slight distension, NT, +BS, no gross ascites or HSM No LE or UE edema RUA AVF patent Neuro is nf, Ox 3  HD: [facility] TTS 4h   300/600  45kg dry wt   Heparin none ("does not clot" per staff)    Assessment: 1. Dyspnea / resp distress - CXR pending, +rales on exam, only 2kg over dry wt. Hx COPD and poor LV function as well. BP's normal. Not grossly vol overloaded on exam but chest exam is abnormal.  2. ESRD on HD TTS [facility] since 2010 3. COPD 4. Ischemic CM EF 25% / hx CABG 5. PVD hx R ax-fem on R x 2, then R > L fem-fem in 2013 6. Anemia Hb 11, get meds 7. MBD get meds 8. Hyperkalemia   Plan- HD today acutely, lower K, solute , volume  Vinson Moselle [name] (pgr) [phone]    [phone] 10/22/2014, 9:56 AM

## 2014-10-22 NOTE — Progress Notes (Signed)
Patient ID: [name]  female  ZOX:[id number]    DOB: August 02, 1941    DOA: 10/22/2014  PCP: Selinda Flavin, MD  Brief history of present illness  Patient is a 74 year old female with ESRD on hemodialysis TTS, CAD status post CABG, ischemic heart hematocrit, MI, diabetes mellitus, chronic atrial fibrillation on amiodarone and anticoagulation,  hypertension, COPD, chronic respiratory failure, diabetes presented with shortness of breath. Patient had last dialysis on Thursday, patient course that she has been progressively getting short of breath and was using her inhaler throughout the day. She also noticed her O2 sats in the 80s and went to [facility] ED. Patient was transferred [facility] for urgent hemodialysis. Patient also noted wheezing with shortness of breath.  Assessment/Plan: Principal Problem:   ESRD on hemodialysis with volume overload - Renal consult obtained, patient undergoing hemodialysis today  Active Problems:   CAD (coronary artery disease) with history of CABG, acute on chronic systolic CHF due to volume overload - Volume management with hemodialysis  COPD exacerbation, acute on chronic respiratory failure -Placed on scheduled bronchodilators, Pulmicort, Zithromax and IV Rocephin - No wheezing at the time of my examination, patient reports significant hyperglycemia with steroids, will hold off steroids   atrial fibrillation - Currently rate controlled, continue amiodarone, warfarin    Hyperkalemia - Patient was given insulin and D50, hemodialysis today  Diabetes mellitus - Continue sliding scale insulin   DVT Prophylaxis:Coumadin per pharmacy  Code Status:Full code  Family Communication:Discussed with patient's daughter at the bedside  Disposition:  Consultants: Nephrology  Procedures: Hemodialysis  Antibiotics:  IV Zithromax, IV Rocephin  Subjective: Patient seen and examined, still feeling somewhat short of breath, awaiting hemodialysis, no wheezing  at the time of my examination.   Objective: Weight change:   Intake/Output Summary (Last 24 hours) at 10/22/14 1118 Last data filed at 10/22/14 1052  Gross per 24 hour  Intake    240 ml  Output      0 ml  Net    240 ml   Blood pressure 97/37, pulse 78, temperature 98.5 F (36.9 C), temperature source Oral, resp. rate 16, height  (1.6 m), weight 47.2 kg (104 lb 0.9 oz), last menstrual period 08/06/1986, SpO2 98 %.  Physical Exam: General: Alert and awake, oriented x3, not in any acute distress. CVS: S1-S2 clear, no murmur rubs or gallops Chest:Decreased breath sounds at the bases, port in the left chest wall  Abdomen: soft nontender, nondistended, normal bowel sounds  Extremities: no cyanosis, clubbing or edema noted bilaterally Neuro: Cranial nerves II-XII intact, no focal neurological deficits  Lab Results: Basic Metabolic Panel:  Recent Labs Lab 10/22/14 0333  NA 131*  K 6.7*  CL 96  CO2 26  GLUCOSE 335*  BUN 44*  CREATININE 4.72*  CALCIUM 9.5   Liver Function Tests:  Recent Labs Lab 10/22/14 0333  AST 18  ALT 14  ALKPHOS 121*  BILITOT 0.6  PROT 6.5  ALBUMIN 3.2*   No results for input(s): LIPASE, AMYLASE in the last 168 hours. No results for input(s): AMMONIA in the last 168 hours. CBC:  Recent Labs Lab 10/22/14 0333  WBC 4.2  NEUTROABS 3.9  HGB 11.1*  HCT 37.4  MCV 89.7  PLT 159   Cardiac Enzymes: No results for input(s): CKTOTAL, CKMB, CKMBINDEX, TROPONINI in the last 168 hours. BNP: Invalid input(s): POCBNP CBG: No results for input(s): GLUCAP in the last 168 hours.   Micro Results: No results found for this or any  previous visit (from the past 240 hour(s)).  Studies/Results: Dg Chest Port 1v Same Day  10/22/2014   CLINICAL DATA:  74 year old female with a history of shortness of breath and cough since this morning  EXAM: PORTABLE CHEST - 1 VIEW SAME DAY  COMPARISON:  10/21/2014, 09/27/2014  FINDINGS: The cardiomediastinal  silhouette is unchanged in size and contour with persisting cardiomegaly. Dense calcifications of the aorta are again evident.  Diffuse interlobular septal thickening.  Slight improvement ever aeration at the right base, though with there is persistent mixed airspace and interstitial disease, prominent at the bases. Obscuration of the bilateral hemidiaphragm and costophrenic angles.  Unchanged position of left-sided port catheter within left subclavian vein in appearing to terminate in the superior vena cava. Huber needle in place.  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  Surgical changes of axillary femoral bypass on the right chest.  IMPRESSION: Persisting interstitial and airspace disease, with slight improvement of aeration at the right base. Changes compatible with congestive heart failure and edema.  Likely persistent bilateral small pleural effusions with associated atelectasis.  Advanced atherosclerosis.  Signed,  Yvone NeuJaime S. Loreta AveWagner, DO  [facility]  [facility]   Electronically Signed   By: Gilmer MorJaime  Wagner D.O.   On: 10/22/2014 09:46    Medications: Scheduled Meds: . amiodarone  100 mg Oral Daily  . azithromycin  500 mg Intravenous Q24H  . budesonide  0.25 mg Nebulization BID  . calcium gluconate  1 g Intravenous Once  . cinacalcet  30 mg Oral Q breakfast  . [START ON 10/27/2014] darbepoetin (ARANESP) injection - DIALYSIS  100 mcg Intravenous Q Thu-HD  . doxercalciferol      . doxercalciferol  3.5 mcg Intravenous Q T,Th,Sa-HD  . ferric gluconate (FERRLECIT/NULECIT) IV  125 mg Intravenous Q T,Th,Sa-HD  . insulin aspart  0-15 Units Subcutaneous TID WC  . insulin aspart  0-5 Units Subcutaneous QHS  . insulin NPH Human  8 Units Subcutaneous QHS  . ipratropium-albuterol  3 mL Nebulization Q4H  . l-methylfolate-B6-B12  1 tablet Oral Daily  . pantoprazole  20 mg Oral Daily  . pravastatin  40 mg Oral Daily  . sevelamer carbonate  3,200 mg  Oral TID WC  . sodium chloride  3 mL Intravenous Q12H  . warfarin  1 mg Oral ONCE-1800  . Warfarin - Pharmacist Dosing Inpatient   Does not apply q1800      LOS: 0 days   [name] M.D. [facility] 10/22/2014, 11:18 AM Pager: [phone]  If 7PM-7AM, please contact night-coverage [email or web address] Password [email or web address]

## 2014-10-22 NOTE — Progress Notes (Addendum)
RN paged about patient potassium level being 6.7  MD Allena KatzPatel paged at [phone]  Orders given at [facility]0630  Irisha Grandmaison, RN

## 2014-10-22 NOTE — Progress Notes (Signed)
Patient arrived to [address] room 21 via PTAR. Patient experienced some SOB upon exertion and ambulation but felt better after getting situated in bed. Admissions paged about patients arrival, awaiting admissions orders from MD. Will continue to monitor patient closely. Monia PouchShakenna Siri Buege, RN

## 2014-10-22 NOTE — Progress Notes (Addendum)
ANTICOAGULATION CONSULT NOTE - Initial Consult  Pharmacy Consult for Coumadin Indication: atrial fibrillation  Allergies  Allergen Reactions  . Aspirin Anaphylaxis  . Penicillins Anaphylaxis  . Septra [Sulfamethoxazole-Trimethoprim] Nausea And Vomiting  . Nsaids Itching    Patient Measurements: Height: 5\' 3"  (160 cm) Weight: 99 lb 12.8 oz (45.269 kg) (scale b) IBW/kg (Calculated) : 52.4  Vital Signs: Temp: 98.5 F (36.9 C) (01/30 0319) Temp Source: Oral (01/30 0319) BP: 146/49 mmHg (01/30 0319) Pulse Rate: 90 (01/30 0319)  Labs:  Recent Labs  10/20/14 1308  INR 1.6     Medical History: Past Medical History  Diagnosis Date  . Mitral regurgitation     Mild-to-moderate, echo, January, 2010 / moderate, e eccentric jet, echo, March, 2012, moderate to moderately severe by bedside echocardiogram December 2012  . Ejection fraction     Ejection fraction 25-30% January, 2013  . Dyslipidemia   . CAD (coronary artery disease)     Nuclear, January, 2010, no ischemia  . Hx of CABG     2007  . Cardiomyopathy     Ischemic status post coronary bypass grafting  . Diabetes mellitus   . Anemia     Chronic  . PAD (peripheral artery disease)     Right axillofemoral bypass, Dr. Arbie CookeyEarly status post redo right axillofemoral bypass August 2012  . Aspirin allergy     Takes Plavix  . Hypertension   . Carotid artery disease     Doppler, May, 2012, 50-69% R. ICA, 50-69% LICA  . Hyperlipidemia   . Arthritis   . Anxiety   . CHF (congestive heart failure) 03/23/12  . H/O amiodarone therapy     Started July, 2013  . PONV (postoperative nausea and vomiting)   . Atrial fibrillation     New onset July, 2013  . GERD (gastroesophageal reflux disease)   . ESRD (end stage renal disease) on dialysis     Dialysis, right arm shunt  / PCI tissue, interventional radiology September, 2010 / temporary catheter left subclavian, new AV fistula right arm maturing March, 201 t2  . ESRD     dialysis  Tu,Thur,Sat  . COPD (chronic obstructive pulmonary disease)     uses oxygen 2 liters 24/7  . Fatigue     Fatigue and sleepiness since hospitalization December, 2013  . Myocardial infarction       Assessment: 73yo female to continue Coumadin for Afib during admission.  Pt was subtherapeutic at outpatient clinic 1/28 and was directed to take boosted dose x2d.  Goal of Therapy:  INR 2-3   Plan:  Will await current INR prior to dosing Coumadin.  Vernard GamblesVeronda Bryk, PharmD, BCPS  10/22/2014,5:16 AM   Addendum:  From 1/28 office visit, Coumadin dose was adjusted to 1mg  daily except 2mg  on Tuesdays.  INR therapeutic this AM.  Will restart this dose.  Plan:  Coumadin 1mg  PO x 1 tonight.  Daily INR.  Toys 'R' UsKimberly Emeric Novinger, Pharm.D., BCPS Clinical Pharmacist Pager [phone] 10/22/2014 8:04 AM

## 2014-10-23 LAB — GLUCOSE, CAPILLARY
GLUCOSE-CAPILLARY: 298 mg/dL — AB (ref 70–99)
Glucose-Capillary: 168 mg/dL — ABNORMAL HIGH (ref 70–99)
Glucose-Capillary: 209 mg/dL — ABNORMAL HIGH (ref 70–99)
Glucose-Capillary: 117 mg/dL — ABNORMAL HIGH (ref 70–99)
Glucose-Capillary: 219 mg/dL — ABNORMAL HIGH (ref 70–99)
Glucose-Capillary: 306 mg/dL — ABNORMAL HIGH (ref 70–99)
Glucose-Capillary: 339 mg/dL — ABNORMAL HIGH (ref 70–99)
Glucose-Capillary: 276 mg/dL — ABNORMAL HIGH (ref 70–99)

## 2014-10-23 LAB — PROTIME-INR
INR: 2.33 — ABNORMAL HIGH (ref 0.00–1.49)
Prothrombin Time: 25.8 seconds — ABNORMAL HIGH (ref 11.6–15.2)

## 2014-10-23 LAB — BASIC METABOLIC PANEL
Anion gap: 10 (ref 5–15)
BUN: 30 mg/dL — ABNORMAL HIGH (ref 6–23)
CALCIUM: 9.1 mg/dL (ref 8.4–10.5)
CHLORIDE: 93 mmol/L — AB (ref 96–112)
CO2: 29 mmol/L (ref 19–32)
Creatinine, Ser: 3.47 mg/dL — ABNORMAL HIGH (ref 0.50–1.10)
GFR calc Af Amer: 14 mL/min — ABNORMAL LOW (ref >90)
GFR calc non Af Amer: 12 mL/min — ABNORMAL LOW (ref >90)
GLUCOSE: 244 mg/dL — AB (ref 70–99)
POTASSIUM: 4.7 mmol/L (ref 3.5–5.1)
SODIUM: 132 mmol/L — AB (ref 135–145)

## 2014-10-23 MED ORDER — WARFARIN SODIUM 1 MG PO TABS
1.0000 mg | ORAL_TABLET | Freq: Once | ORAL | Status: AC
  Administered 2014-10-23: 1 mg via ORAL
  Filled 2014-10-23 (×2): qty 1

## 2014-10-23 MED ORDER — METHYLPREDNISOLONE SODIUM SUCC 125 MG IJ SOLR: 60.0000 mg | Freq: Two times a day (BID) | INTRAMUSCULAR | Status: DC

## 2014-10-23 MED ORDER — INSULIN NPH (HUMAN) (ISOPHANE) 100 UNIT/ML ~~LOC~~ SUSP
10.0000 [IU] | Freq: Two times a day (BID) | SUBCUTANEOUS | Status: DC
  Administered 2014-10-23: 10 [IU] via SUBCUTANEOUS
  Filled 2014-10-23: qty 10

## 2014-10-23 MED ORDER — PREDNISONE 20 MG PO TABS
40.0000 mg | ORAL_TABLET | Freq: Every day | ORAL | Status: DC
  Filled 2014-10-23 (×2): qty 2

## 2014-10-23 MED ORDER — IPRATROPIUM-ALBUTEROL 0.5-2.5 (3) MG/3ML IN SOLN
3.0000 mL | Freq: Four times a day (QID) | RESPIRATORY_TRACT | Status: DC
  Administered 2014-10-23 (×4): 3 mL via RESPIRATORY_TRACT
  Filled 2014-10-23 (×7): qty 3

## 2014-10-23 MED ORDER — METHYLPREDNISOLONE SODIUM SUCC 40 MG IJ SOLR
40.0000 mg | Freq: Two times a day (BID) | INTRAMUSCULAR | Status: AC
  Administered 2014-10-23 (×2): 40 mg via INTRAVENOUS
  Filled 2014-10-23 (×2): qty 1

## 2014-10-23 MED ORDER — ALBUTEROL SULFATE (2.5 MG/3ML) 0.083% IN NEBU: 2.5000 mg | INHALATION_SOLUTION | RESPIRATORY_TRACT | Status: DC | PRN

## 2014-10-23 NOTE — Progress Notes (Signed)
  [name] Progress Note   Subjective: breathing much better, 2.7 kg off with HD yest, BP's soft.    Filed Vitals:   10/22/14 1347 10/22/14 2010 10/22/14 2359 10/23/14 0549  BP: 139/51 112/40  105/36  Pulse: 87 85 88 73  Temp: 97.5 F (36.4 C) 98.3 F (36.8 C)  98 F (36.7 C)  TempSrc: Oral Oral  Oral  Resp: 16 18 16 18   Height:      Weight:    44.679 kg (98 lb 8 oz)  SpO2:  98% 98% 100%   Exam: Elderly pleasant WF, calm, looks much better No jvd Chest scant rales at bases, no wheezing, decent air movement bilat RRR 2/6 SEM RUSB, no rub or gallop Abd slight distension, NT, +BS No LE or UE edema RUA AVF patent Neuro is nf, Ox 3  HD: [facility] TTS 4h 300/600 45kg dry wt Heparin none ("does not clot" per staff)    Assessment: 1. Dyspnea / resp distress / pulm edema - improved after HD yesterday.  She is at her dry wt today but prob still needs more volume removed and dry wt lowered. Have dc'd IVF and IV abx, plan HD tomorrow 3hr and lower dry wt. Should be able to go home after HD tomorrow to have OP HD on Tuesday. She has a narrow volume window due to comorbidities (LV dysfunction, COPD) and prognosis is not great. Desires all interventions at this time.  2. ESRD on HD TTS [facility] since 2010 3. COPD 4. Ischemic CM EF 25% / hx CABG 5. PVD hx R ax-fem on R x 2, then R > L fem-fem in 2013 6. Anemia Hb 11, get meds 7. MBD get meds 8. Hyperkalemia - better  Plan- HD first shift in am, lower vol more, stopped IV abx and KVO.     Vinson Moselleob Phillippe Orlick MD  pager [phone]    cell [phone]  10/23/2014, 7:13 AM     Recent Labs Lab 10/22/14 0333  NA 131*  K 6.7*  CL 96  CO2 26  GLUCOSE 335*  BUN 44*  CREATININE 4.72*  CALCIUM 9.5    Recent Labs Lab 10/22/14 0333  AST 18  ALT 14  ALKPHOS 121*  BILITOT 0.6  PROT 6.5  ALBUMIN 3.2*    Recent Labs Lab 10/22/14 0333  WBC 4.2  NEUTROABS 3.9  HGB 11.1*  HCT 37.4  MCV 89.7  PLT 159   .  amiodarone  100 mg Oral Daily  . budesonide  0.25 mg Nebulization BID  . cinacalcet  30 mg Oral Q breakfast  . [START ON 10/27/2014] darbepoetin (ARANESP) injection - DIALYSIS  100 mcg Intravenous Q Thu-HD  . doxercalciferol  3.5 mcg Intravenous Q T,Th,Sa-HD  . ferric gluconate (FERRLECIT/NULECIT) IV  125 mg Intravenous Q T,Th,Sa-HD  . insulin aspart  0-15 Units Subcutaneous TID WC  . insulin aspart  0-5 Units Subcutaneous QHS  . insulin NPH Human  8 Units Subcutaneous QHS  . ipratropium-albuterol  3 mL Nebulization QID  . l-methylfolate-B6-B12  1 tablet Oral Daily  . pantoprazole  20 mg Oral Daily  . pravastatin  40 mg Oral Daily  . sevelamer carbonate  3,200 mg Oral TID WC  . sodium chloride  3 mL Intravenous Q12H  . Warfarin - Pharmacist Dosing Inpatient   Does not apply q1800     acetaminophen **OR** acetaminophen, ALPRAZolam, HYDROcodone-acetaminophen, lidocaine-prilocaine, ondansetron **OR** ondansetron (ZOFRAN) IV, sevelamer carbonate, sodium chloride

## 2014-10-23 NOTE — Progress Notes (Signed)
Patient ID: [name]  female  WUJ:[id number]    DOB: 1941/06/03    DOA: 10/22/2014  PCP: Selinda Flavin, MD  Brief history of present illness  Patient is a 74 year old female with ESRD on hemodialysis TTS, CAD status post CABG, ischemic heart hematocrit, MI, diabetes mellitus, chronic atrial fibrillation on amiodarone and anticoagulation,  hypertension, COPD, chronic respiratory failure, diabetes presented with shortness of breath. Patient had last dialysis on Thursday, patient course that she has been progressively getting short of breath and was using her inhaler throughout the day. She also noticed her O2 sats in the 80s and went to [facility] ED. Patient was transferred [facility] for urgent hemodialysis. Patient also noted wheezing with shortness of breath.  Assessment/Plan: Principal Problem:   ESRD on hemodialysis with volume overload - Renal following, patient underwent hemodialysis yesterday, plan for another hemodialysis tomorrow morning, then subsequently resume her TTS schedule.   Active Problems:   CAD (coronary artery disease) with history of CABG, acute on chronic systolic CHF due to volume overload - Volume management with hemodialysis  COPD exacerbation, acute on chronic respiratory failure: wheezing today - on scheduled bronchodilators, Pulmicort,  - Abx DC'd by renal. Started on IV Solu-Medrol today, for 2 doses, changed to prednisone in a.m.   atrial fibrillation - Currently rate controlled, continue amiodarone, warfarin    Hyperkalemia - Patient was given insulin and D50, hemodialysis today  Diabetes mellitus - Continue sliding scale insulin, placed on NPH 10 units twice a day, will adjust according to blood sugar readings   DVT Prophylaxis:Coumadin per pharmacy  Code Status:Full code  Family Communication:Discussed with patient's daughter at the bedside  Disposition:  Consultants: Nephrology  Procedures: Hemodialysis  Antibiotics:  IV Zithromax,  IV Rocephin DC'd 1/31  Subjective: Patient seen and examined, overall feeling better however diffusely wheezing especially in upper lobes  Objective: Weight change: 1.931 kg (4 lb 4.1 oz)  Intake/Output Summary (Last 24 hours) at 10/23/14 1115 Last data filed at 10/23/14 1058  Gross per 24 hour  Intake   1150 ml  Output   2766 ml  Net  -1616 ml   Blood pressure 105/36, pulse 73, temperature 98 F (36.7 C), temperature source Oral, resp. rate 18, height  (1.6 m), weight 44.679 kg (98 lb 8 oz), last menstrual period 08/06/1986, SpO2 100 %.  Physical Exam: General: Alert and awake, oriented x3, not in any acute distress. CVS: S1-S2 clear, no murmur rubs or gallops Chest: Wheezing bilaterally in upper lobes port in the left chest wall  Abdomen: soft nontender, nondistended, normal bowel sounds  Extremities: no cyanosis, clubbing or edema noted bilaterally Neuro: Cranial nerves II-XII intact, no focal neurological deficits  Lab Results: Basic Metabolic Panel:  Recent Labs Lab 10/22/14 0333 10/23/14 0500  NA 131* 132*  K 6.7* 4.7  CL 96 93*  CO2 26 29  GLUCOSE 335* 244*  BUN 44* 30*  CREATININE 4.72* 3.47*  CALCIUM 9.5 9.1   Liver Function Tests:  Recent Labs Lab 10/22/14 0333  AST 18  ALT 14  ALKPHOS 121*  BILITOT 0.6  PROT 6.5  ALBUMIN 3.2*   No results for input(s): LIPASE, AMYLASE in the last 168 hours. No results for input(s): AMMONIA in the last 168 hours. CBC:  Recent Labs Lab 10/22/14 0333  WBC 4.2  NEUTROABS 3.9  HGB 11.1*  HCT 37.4  MCV 89.7  PLT 159   Cardiac Enzymes: No results for input(s): CKTOTAL, CKMB, CKMBINDEX, TROPONINI in the  last 168 hours. BNP: Invalid input(s): POCBNP CBG:  Recent Labs Lab 10/22/14 2135 10/22/14 2218 10/23/14 0110 10/23/14 0611 10/23/14 1057  GLUCAP 61* 84 168* 209* 117*     Micro Results: No results found for this or any previous visit (from the past 240 hour(s)).  Studies/Results: Dg  Chest Port 1v Same Day  10/22/2014   CLINICAL DATA:  74 year old female with a history of shortness of breath and cough since this morning  EXAM: PORTABLE CHEST - 1 VIEW SAME DAY  COMPARISON:  10/21/2014, 09/27/2014  FINDINGS: The cardiomediastinal silhouette is unchanged in size and contour with persisting cardiomegaly. Dense calcifications of the aorta are again evident.  Diffuse interlobular septal thickening.  Slight improvement ever aeration at the right base, though with there is persistent mixed airspace and interstitial disease, prominent at the bases. Obscuration of the bilateral hemidiaphragm and costophrenic angles.  Unchanged position of left-sided port catheter within left subclavian vein in appearing to terminate in the superior vena cava. Huber needle in place.  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  Surgical changes of axillary femoral bypass on the right chest.  IMPRESSION: Persisting interstitial and airspace disease, with slight improvement of aeration at the right base. Changes compatible with congestive heart failure and edema.  Likely persistent bilateral small pleural effusions with associated atelectasis.  Advanced atherosclerosis.  Signed,  Yvone NeuJaime S. Loreta AveWagner, DO  [facility]  [facility]   Electronically Signed   By: Gilmer MorJaime  Wagner D.O.   On: 10/22/2014 09:46    Medications: Scheduled Meds: . amiodarone  100 mg Oral Daily  . budesonide  0.25 mg Nebulization BID  . cinacalcet  30 mg Oral Q breakfast  . [START ON 10/27/2014] darbepoetin (ARANESP) injection - DIALYSIS  100 mcg Intravenous Q Thu-HD  . doxercalciferol  3.5 mcg Intravenous Q T,Th,Sa-HD  . ferric gluconate (FERRLECIT/NULECIT) IV  125 mg Intravenous Q T,Th,Sa-HD  . insulin aspart  0-15 Units Subcutaneous TID WC  . insulin aspart  0-5 Units Subcutaneous QHS  . insulin NPH Human  10 Units Subcutaneous BID AC & HS  . ipratropium-albuterol  3 mL Nebulization QID   . l-methylfolate-B6-B12  1 tablet Oral Daily  . methylPREDNISolone (SOLU-MEDROL) injection  40 mg Intravenous Q12H  . pantoprazole  20 mg Oral Daily  . pravastatin  40 mg Oral Daily  . [START ON 10/24/2014] predniSONE  40 mg Oral Q breakfast  . sevelamer carbonate  3,200 mg Oral TID WC  . sodium chloride  3 mL Intravenous Q12H  . warfarin  1 mg Oral ONCE-1800  . Warfarin - Pharmacist Dosing Inpatient   Does not apply q1800   Time spent 25 minutes   LOS: 1 day   Emillie Chasen M.D. [facility] 10/23/2014, 11:15 AM Pager: [phone]  If 7PM-7AM, please contact night-coverage [email or web address] Password [email or web address]

## 2014-10-23 NOTE — Progress Notes (Signed)
Patient Respiratory protocol done and patient changed to QID with Prn Albuterol through the night. Patient says she takes inhaler Albuterol at home every 4-6 hours through the day and only if she wakes up and needs it. Changed per protocol scoring an 8. Will reassess in 3 days to see about changing to home regimen.

## 2014-10-23 NOTE — Progress Notes (Signed)
Utilization review completed.  

## 2014-10-23 NOTE — Progress Notes (Signed)
ANTICOAGULATION CONSULT NOTE - Initial Consult  Pharmacy Consult for Coumadin Indication: atrial fibrillation  Allergies  Allergen Reactions  . Aspirin Anaphylaxis  . Penicillins Anaphylaxis  . Septra [Sulfamethoxazole-Trimethoprim] Nausea And Vomiting  . Nsaids Itching    Patient Measurements: Height: 5\' 3"  (160 cm) Weight: 98 lb 8 oz (44.679 kg) (scale b) IBW/kg (Calculated) : 52.4  Vital Signs: Temp: 98 F (36.7 C) (01/31 0549) Temp Source: Oral (01/31 0549) BP: 105/36 mmHg (01/31 0549) Pulse Rate: 73 (01/31 0549)  Labs:  Recent Labs  10/20/14 1308 10/22/14 0333 10/23/14 0500  HGB  --  11.1*  --   HCT  --  37.4  --   PLT  --  159  --   LABPROT  --  24.2* 25.8*  INR 1.6 2.15* 2.33*  CREATININE  --  4.72* 3.47*     Medical History: Past Medical History  Diagnosis Date  . Mitral regurgitation     Mild-to-moderate, echo, January, 2010 / moderate, e eccentric jet, echo, March, 2012, moderate to moderately severe by bedside echocardiogram December 2012  . Ejection fraction     Ejection fraction 25-30% January, 2013  . Dyslipidemia   . CAD (coronary artery disease)     Nuclear, January, 2010, no ischemia  . Hx of CABG     2007  . Cardiomyopathy     Ischemic status post coronary bypass grafting  . Diabetes mellitus   . Anemia     Chronic  . PAD (peripheral artery disease)     Right axillofemoral bypass, Dr. Arbie CookeyEarly status post redo right axillofemoral bypass August 2012  . Aspirin allergy     Takes Plavix  . Hypertension   . Carotid artery disease     Doppler, May, 2012, 50-69% R. ICA, 50-69% LICA  . Hyperlipidemia   . Arthritis   . Anxiety   . CHF (congestive heart failure) 03/23/12  . H/O amiodarone therapy     Started July, 2013  . PONV (postoperative nausea and vomiting)   . Atrial fibrillation     New onset July, 2013  . GERD (gastroesophageal reflux disease)   . ESRD (end stage renal disease) on dialysis     Dialysis, right arm shunt  / PCI  tissue, interventional radiology September, 2010 / temporary catheter left subclavian, new AV fistula right arm maturing March, 201 t2  . ESRD     dialysis Tu,Thur,Sat  . COPD (chronic obstructive pulmonary disease)     uses oxygen 2 liters 24/7  . Fatigue     Fatigue and sleepiness since hospitalization December, 2013  . Myocardial infarction   . ESRD on hemodialysis 10/22/2014    Started dialysis in 2010, cause of ESRD was DM.  [place] [facility] [place] on [place].  Right upper arm AVF as of Jan 2016.       Assessment: 73yo female to continue Coumadin for Afib during admission.  Pt was subtherapeutic at outpatient clinic 1/28 and was directed to take boosted dose x2d. Coumadin dose was adjusted to 1mg  daily except 2mg  on Tuesdays.  INR therapeutic this AM.  Will continue this dose.  Goal of Therapy:  INR 2-3   Plan:  Coumadin 1mg  PO x 1 tonight.   Daily INR.  Toys 'R' UsKimberly Deshay Kirstein, Pharm.D., BCPS Clinical Pharmacist Pager [phone] 10/23/2014 10:32 AM

## 2014-10-24 LAB — GLUCOSE, CAPILLARY
GLUCOSE-CAPILLARY: 242 mg/dL — AB (ref 70–99)
Glucose-Capillary: 131 mg/dL — ABNORMAL HIGH (ref 70–99)
Glucose-Capillary: 239 mg/dL — ABNORMAL HIGH (ref 70–99)
Glucose-Capillary: 256 mg/dL — ABNORMAL HIGH (ref 70–99)
Glucose-Capillary: 289 mg/dL — ABNORMAL HIGH (ref 70–99)

## 2014-10-24 LAB — BASIC METABOLIC PANEL
Anion gap: 11 (ref 5–15)
BUN: 56 mg/dL — AB (ref 6–23)
CHLORIDE: 92 mmol/L — AB (ref 96–112)
CO2: 27 mmol/L (ref 19–32)
CREATININE: 5.05 mg/dL — AB (ref 0.50–1.10)
Calcium: 9 mg/dL (ref 8.4–10.5)
GFR calc Af Amer: 9 mL/min — ABNORMAL LOW (ref >90)
GFR, EST NON AFRICAN AMERICAN: 8 mL/min — AB (ref >90)
Glucose, Bld: 267 mg/dL — ABNORMAL HIGH (ref 70–99)
POTASSIUM: 6.2 mmol/L — AB (ref 3.5–5.1)
SODIUM: 130 mmol/L — AB (ref 135–145)

## 2014-10-24 LAB — HEMOGLOBIN A1C
Hgb A1c MFr Bld: 7.1 % — ABNORMAL HIGH (ref 4.8–5.6)
Mean Plasma Glucose: 157 mg/dL

## 2014-10-24 LAB — CBC
HCT: 32.7 % — ABNORMAL LOW (ref 36.0–46.0)
Hemoglobin: 10.2 g/dL — ABNORMAL LOW (ref 12.0–15.0)
MCH: 26.9 pg (ref 26.0–34.0)
MCHC: 31.2 g/dL (ref 30.0–36.0)
MCV: 86.3 fL (ref 78.0–100.0)
Platelets: 102 10*3/uL — ABNORMAL LOW (ref 150–400)
RBC: 3.79 MIL/uL — ABNORMAL LOW (ref 3.87–5.11)
RDW: 18.4 % — AB (ref 11.5–15.5)
WBC: 5.3 10*3/uL (ref 4.0–10.5)

## 2014-10-24 LAB — PROTIME-INR
INR: 2.23 — ABNORMAL HIGH (ref 0.00–1.49)
PROTHROMBIN TIME: 24.9 s — AB (ref 11.6–15.2)

## 2014-10-24 MED ORDER — BUDESONIDE 180 MCG/ACT IN AEPB: 1.0000 | INHALATION_SPRAY | Freq: Two times a day (BID) | RESPIRATORY_TRACT | Status: DC

## 2014-10-24 MED ORDER — HEPARIN SOD (PORK) LOCK FLUSH 100 UNIT/ML IV SOLN
500.0000 [IU] | INTRAVENOUS | Status: AC | PRN
  Administered 2014-10-24: 500 [IU]

## 2014-10-24 MED ORDER — INSULIN NPH (HUMAN) (ISOPHANE) 100 UNIT/ML ~~LOC~~ SUSP
15.0000 [IU] | Freq: Two times a day (BID) | SUBCUTANEOUS | Status: DC
  Administered 2014-10-24: 15 [IU] via SUBCUTANEOUS
  Filled 2014-10-24: qty 10

## 2014-10-24 MED ORDER — WARFARIN SODIUM 1 MG PO TABS
1.0000 mg | ORAL_TABLET | Freq: Once | ORAL | Status: DC
  Filled 2014-10-24: qty 1

## 2014-10-24 NOTE — Progress Notes (Signed)
DC [facility] De-accessed , DC Tele, DC Home. Discharge instructions and home medications discussed with patient and patient's daughter. Patient and Patient's daughter denied any questions or concerns at this time. Patient leaving unit via wheelchair and appears in no acute distress.

## 2014-10-24 NOTE — Progress Notes (Signed)
Chaplain responded to spiritual care consult that pt requested prayer. Pt said she wanted to pray "For my health," and shared with chaplain the numerous health issues she has been facing.Pt is supported by her son and daughter-in-law, who care for her. Chaplain provided prayer and empathic listening as pt then shared how meaningful her faith has been to her through these difficulties. Pt will be discharged today.    Wille GlaserMcCray, Michal Strzelecki O, Chaplain 10/24/2014 3:33 PM

## 2014-10-24 NOTE — Progress Notes (Signed)
ANTICOAGULATION CONSULT NOTE  Pharmacy Consult for Coumadin Indication: atrial fibrillation  Allergies  Allergen Reactions  . Aspirin Anaphylaxis  . Penicillins Anaphylaxis  . Septra [Sulfamethoxazole-Trimethoprim] Nausea And Vomiting  . Nsaids Itching    Labs:  Recent Labs  10/22/14 0333 10/23/14 0500 10/24/14 0420 10/24/14 0812  HGB 11.1*  --   --  10.2*  HCT 37.4  --   --  32.7*  PLT 159  --   --  102*  LABPROT 24.2* 25.8* 24.9*  --   INR 2.15* 2.33* 2.23*  --   CREATININE 4.72* 3.47* 5.05*  --     Assessment: 73yo female to continue Coumadin for Afib during admission.  Pt was subtherapeutic at outpatient clinic 1/28 and was directed to take boosted dose x2d.  Continue home dose of Coumadin = 1 mg MTSatSun, 2 mg WThF.  Goal of Therapy:  INR 2-3   Plan:  Coumadin 1mg  PO x 1 tonight.   Daily INR.  Thank you. Okey RegalLisa Vernor Monnig, PharmD [phone]  10/24/2014 9:41 AM

## 2014-10-24 NOTE — Procedures (Signed)
Pt seen on HD.  Says breathing is better.  K sl high, on 2K bath.  BFR 300.  Will plan HD in AM if pt still here.

## 2014-10-24 NOTE — Discharge Summary (Signed)
Physician Discharge Summary  Patient ID: [name] MRN: [id number] DOB/AGE: 1941-01-01 74 y.o.  Admit date: 10/22/2014 Discharge date: 10/24/2014  Primary Care Physician:  Selinda Flavin, MD  Discharge Diagnoses:    Acute hypoxic respiratory failure secondary to volume overload and acute bronchitis  ESRD on hemodialysis  Uncontrolled diabetes mellitus Hyperkalemia  . Anemia . Atrial fibrillation . CAD (coronary artery disease) . Chronic systolic heart failure . Hypertension   Consults:  Nephrology, Dr. Arlean Hopping   Recommendations for Outpatient Follow-up:  Patient to resume hemodialysis on regular days starting tomorrow, TTS   DIET: Carb modified diet    Allergies:   Allergies  Allergen Reactions  . Aspirin Anaphylaxis  . Penicillins Anaphylaxis  . Septra [Sulfamethoxazole-Trimethoprim] Nausea And Vomiting  . Nsaids Itching     Discharge Medications:   Medication List    TAKE these medications        albuterol 108 (90 BASE) MCG/ACT inhaler  Commonly known as:  PROVENTIL HFA;VENTOLIN HFA  Inhale 2 puffs into the lungs daily.     albuterol (2.5 MG/3ML) 0.083% nebulizer solution  Commonly known as:  PROVENTIL  Take 2.5 mg by nebulization every 6 (six) hours as needed. For shortness of breath     ALPRAZolam 0.5 MG tablet  Commonly known as:  XANAX  Take 0.5 mg by mouth 3 (three) times daily as needed. For anxiety     budesonide 180 MCG/ACT inhaler  Commonly known as:  PULMICORT FLEXHALER  Inhale 1 puff into the lungs 2 (two) times daily.     cinacalcet 30 MG tablet  Commonly known as:  SENSIPAR  Take 30 mg by mouth daily.     HYDROcodone-acetaminophen 5-325 MG per tablet  Commonly known as:  NORCO/VICODIN  Take 1 tablet by mouth 3 (three) times daily.     insulin NPH Human 100 UNIT/ML injection  Commonly known as:  HUMULIN N,NOVOLIN N  Inject 8 Units into the skin at bedtime. 40 units is the morning and 10 units every evening     pantoprazole  20 MG tablet  Commonly known as:  PROTONIX  Take 20 mg by mouth daily.     pravastatin 40 MG tablet  Commonly known as:  PRAVACHOL  TAKE 1 BY MOUTH DAILY     sevelamer carbonate 800 MG tablet  Commonly known as:  RENVELA  Take 1,600-3,200 mg by mouth See admin instructions. Take 4 tablets with meals and 2 tablets with snacks.     sodium chloride 0.9 % nebulizer solution  Take 3 mLs by nebulization 4 (four) times daily as needed.     warfarin 2 MG tablet  Commonly known as:  COUMADIN  Take 1-2 mg by mouth See admin instructions. Take 1 mg on Mon, Tues, Sat, Sunday and 2 mg Wed - Friday     warfarin 2 MG tablet  Commonly known as:  COUMADIN  TAKE ONE TABLET BY MOUTH DAILY OR AS DIRECTED BY COUMADIN CLINIC         Brief H and P: For complete details please refer to admission H and P, but in brief Patient is a 74 year old female with ESRD on hemodialysis TTS, CAD status post CABG, ischemic heart hematocrit, MI, diabetes mellitus, chronic atrial fibrillation on amiodarone and anticoagulation, hypertension, COPD, chronic respiratory failure, diabetes presented with shortness of breath. Patient had last dialysis on Thursday, patient course that she has been progressively getting short of breath and was using her inhaler throughout the day. She also noticed  her O2 sats in the 80s and went to [facility] ED. Patient was transferred [facility] for urgent hemodialysis. Patient also noted wheezing with shortness of breath.  Hospital Course:   ESRD on hemodialysis with volume overload The patient was admitted with shortness of breath, wheezing. Patient was placed on scheduled nebulizer breathing treatments. Nephrology was consulted, patient was noticed to have volume overload, she had hemodialysis on her regular day on Saturday and subsequently today again to bring down the volume. She will now continue her regular schedule tomorrow TTS per nephrology medications.   CAD (coronary artery disease)  with history of CABG, acute on chronic systolic CHF due to volume overload - Volume management with hemodialysis  COPD exacerbation, acute on chronic respiratory failure:  Patient was placed on antibiotics which were discontinued as patient did not have any coughing with productive phlegm or fevers or chills and no pneumonia on the chest x-ray. She was placed on scheduled bronchodilators and Pulmicort was added. Patient did receive 2 doses of Solu-Medrol however which led to significant hyperglycemia. Patient is not interested in continuing the prednisone taper upon dc and she is not currently wheezing hence it was discontinued. She was recommended to follow up with her PCP in 10 days.  atrial fibrillation - Currently rate controlled, continue amiodarone, warfarin   Hyperkalemia - Patient was given insulin and D50, and underwent hemodialysis  Diabetes mellitus, type II, insulin-dependent - Continue NPH insulin per her home schedule, patient is impressively very educated about her medical illness, care and diabetes. Patient checks her CBGs and will adjust her insulin according to the readings.   Day of Discharge BP 114/52 mmHg  Pulse 79  Temp(Src) 97.9 F (36.6 C) (Oral)  Resp 16  Ht  (1.6 m)  Wt 48.9 kg (107 lb 12.9 oz)  BMI 19.10 kg/m2  SpO2 98%  LMP 08/06/1986  Physical Exam: General: Alert and awake oriented x3 not in any acute distress. CVS: S1-S2 clear no murmur rubs or gallops Chest: clear to auscultation bilaterally, no wheezing rales or rhonchi Abdomen: soft nontender, nondistended, normal bowel sounds Extremities: no cyanosis, clubbing or edema noted bilaterally Neuro: Cranial nerves II-XII intact, no focal neurological deficits   The results of significant diagnostics from this hospitalization (including imaging, microbiology, ancillary and laboratory) are listed below for reference.    LAB RESULTS: Basic Metabolic Panel:  Recent Labs Lab 10/23/14 0500  10/24/14 0420  NA 132* 130*  K 4.7 6.2*  CL 93* 92*  CO2 29 27  GLUCOSE 244* 267*  BUN 30* 56*  CREATININE 3.47* 5.05*  CALCIUM 9.1 9.0   Liver Function Tests:  Recent Labs Lab 10/22/14 0333  AST 18  ALT 14  ALKPHOS 121*  BILITOT 0.6  PROT 6.5  ALBUMIN 3.2*   No results for input(s): LIPASE, AMYLASE in the last 168 hours. No results for input(s): AMMONIA in the last 168 hours. CBC:  Recent Labs Lab 10/22/14 0333 10/24/14 0812  WBC 4.2 5.3  NEUTROABS 3.9  --   HGB 11.1* 10.2*  HCT 37.4 32.7*  MCV 89.7 86.3  PLT 159 102*   Cardiac Enzymes: No results for input(s): CKTOTAL, CKMB, CKMBINDEX, TROPONINI in the last 168 hours. BNP: Invalid input(s): POCBNP CBG:  Recent Labs Lab 10/24/14 0359 10/24/14 0557  GLUCAP 239* 256*    Significant Diagnostic Studies:  Dg Chest Port 1v Same Day  10/22/2014   CLINICAL DATA:  74 year old female with a history of shortness of breath and cough since  this morning  EXAM: PORTABLE CHEST - 1 VIEW SAME DAY  COMPARISON:  10/21/2014, 09/27/2014  FINDINGS: The cardiomediastinal silhouette is unchanged in size and contour with persisting cardiomegaly. Dense calcifications of the aorta are again evident.  Diffuse interlobular septal thickening.  Slight improvement ever aeration at the right base, though with there is persistent mixed airspace and interstitial disease, prominent at the bases. Obscuration of the bilateral hemidiaphragm and costophrenic angles.  Unchanged position of left-sided port catheter within left subclavian vein in appearing to terminate in the superior vena cava. Huber needle in place.  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  Surgical changes of axillary femoral bypass on the right chest.  IMPRESSION: Persisting interstitial and airspace disease, with slight improvement of aeration at the right base. Changes compatible with congestive heart failure and edema.  Likely persistent bilateral small pleural  effusions with associated atelectasis.  Advanced atherosclerosis.  Signed,  Yvone NeuJaime S. Loreta AveWagner, DO  [facility]  [facility]   Electronically Signed   By: Gilmer MorJaime  Wagner D.O.   On: 10/22/2014 09:46     Disposition and Follow-up:     Discharge Instructions    Diet Carb Modified    Complete by:  As directed      Discharge instructions    Complete by:  As directed   It is VERY IMPORTANT that you follow up with a PCP on a regular basis.  Check your blood glucoses before each meal and at bedtime and maintain a log of your readings.  Bring this log with you when you follow up with your PCP so that he or she can adjust your insulin at your follow up visit.     Increase activity slowly    Complete by:  As directed             DISPOSITION:home   DISCHARGE FOLLOW-UP Follow-up Information    Follow up with Selinda FlavinHOWARD, KEVIN, MD. Schedule an appointment as soon as possible for a visit in 10 days.   Specialty:  Family Medicine   Why:  for hospital follow-up   Contact information:   [address] [place] [address] [phone]        Time spent on Discharge: 33 mins  Signed:   Mckenize Mezera M.D. [facility] 10/24/2014, 11:27 AM Pager: [phone]

## 2014-11-01 ENCOUNTER — Encounter: Payer: Self-pay | Admitting: *Deleted

## 2014-11-02 ENCOUNTER — Encounter: Payer: Self-pay | Admitting: Cardiology

## 2014-11-02 ENCOUNTER — Ambulatory Visit (INDEPENDENT_AMBULATORY_CARE_PROVIDER_SITE_OTHER): Payer: Medicare Other | Admitting: Cardiology

## 2014-11-02 VITALS — BP 100/62 | HR 54 | Ht 63.0 in | Wt 99.0 lb

## 2014-11-02 DIAGNOSIS — I779 Disorder of arteries and arterioles, unspecified: Secondary | ICD-10-CM

## 2014-11-02 DIAGNOSIS — I482 Chronic atrial fibrillation, unspecified: Secondary | ICD-10-CM

## 2014-11-02 DIAGNOSIS — I5022 Chronic systolic (congestive) heart failure: Secondary | ICD-10-CM

## 2014-11-02 DIAGNOSIS — Z9229 Personal history of other drug therapy: Secondary | ICD-10-CM

## 2014-11-02 DIAGNOSIS — Z7901 Long term (current) use of anticoagulants: Secondary | ICD-10-CM

## 2014-11-02 DIAGNOSIS — I350 Nonrheumatic aortic (valve) stenosis: Secondary | ICD-10-CM

## 2014-11-02 DIAGNOSIS — I739 Peripheral vascular disease, unspecified: Secondary | ICD-10-CM

## 2014-11-02 DIAGNOSIS — Z09 Encounter for follow-up examination after completed treatment for conditions other than malignant neoplasm: Secondary | ICD-10-CM

## 2014-11-02 NOTE — Progress Notes (Signed)
Cardiology Office Note   Date:  11/02/2014   ID:  [name], DOB 05-23-1941, MRN [id number]  PCP:  Selinda Flavin, MD  Cardiologist:  Willa Rough, MD   Chief Complaint  Patient presents with  . Appointment    Follow-up coronary artery disease      History of Present Illness: [name] is a 74 y.o. female who presents today to follow-up coronary disease, ischemic cardiomyopathy, paroxysmal atrial fibrillation. She is a remarkable lady. Despite her multitude of medical problems, she has a good outlook. Most recently she was hospitalized with volume overload. It appears that her true body weight has decreased. Significant volume was removed and she felt much better. There was also question of some bronchitis. She feels better now. She's not having chest pain. I reviewed the hospital records concerning her admission.    Past Medical History  Diagnosis Date  . Mitral regurgitation     Mild-to-moderate, echo, January, 2010 / moderate, e eccentric jet, echo, March, 2012, moderate to moderately severe by bedside echocardiogram December 2012  . Ejection fraction     Ejection fraction 25-30% January, 2013  . Dyslipidemia   . CAD (coronary artery disease)     Nuclear, January, 2010, no ischemia  . Hx of CABG     2007  . Cardiomyopathy     Ischemic status post coronary bypass grafting  . Diabetes mellitus   . Anemia     Chronic  . PAD (peripheral artery disease)     Right axillofemoral bypass, Dr. Arbie Cookey status post redo right axillofemoral bypass August 2012  . Aspirin allergy     Takes Plavix  . Hypertension   . Carotid artery disease     Doppler, May, 2012, 50-69% R. ICA, 50-69% LICA  . Hyperlipidemia   . Arthritis   . Anxiety   . CHF (congestive heart failure) 03/23/12  . H/O amiodarone therapy     Started July, 2013  . PONV (postoperative nausea and vomiting)   . Atrial fibrillation     New onset July, 2013  . GERD (gastroesophageal reflux disease)   . ESRD  (end stage renal disease) on dialysis     Dialysis, right arm shunt  / PCI tissue, interventional radiology September, 2010 / temporary catheter left subclavian, new AV fistula right arm maturing March, 201 t2  . ESRD     dialysis Tu,Thur,Sat  . COPD (chronic obstructive pulmonary disease)     uses oxygen 2 liters 24/7  . Fatigue     Fatigue and sleepiness since hospitalization December, 2013  . Myocardial infarction   . ESRD on hemodialysis 10/22/2014    Started dialysis in 2010, cause of ESRD was DM.  She goes to [facility] in [place] on TTS schedule.  Right upper arm AVF as of Jan 2016.       Past Surgical History  Procedure Laterality Date  . Coronary artery bypass graft   07/27/2006    [name]. [name], M.D  . Abdominal hysterectomy    . Laparoscopic salpingoopherectomy    . Appendectomy    . Fem-fem bypass  2003    left to right   . Right femoral bypass  2004  . Basal cell carcinoma excision  2004    REMOVAL FROM NOSE  . Arteriovenous graft placement  right  11-30-2010,  05-20-2011  . Femoral-femoral bypass graft  06/12/2012    Procedure: BYPASS GRAFT FEMORAL-FEMORAL ARTERY;  Surgeon: Larina Earthly, MD;  Location:  [facility] OR;  Service: Vascular;  Laterality: N/A;  Right to left femoral to femoral bypass  . Abdominal aortagram N/A 04/22/2012    Procedure: ABDOMINAL AORTAGRAM;  Surgeon: Larina Earthly, MD;  Location: [facility] CATH LAB;  Service: Cardiovascular;  Laterality: N/A;    Patient Active Problem List   Diagnosis Date Noted  . ESRD on hemodialysis 10/22/2014  . Hyperkalemia 10/22/2014  . On home O2 12/10/2013  . Aortic stenosis 12/09/2013  . Encounter for therapeutic drug monitoring 10/22/2013  . Cold right foot 10/07/2013  . PVD (peripheral vascular disease) 10/07/2013  . Fatigue   . H/O amiodarone therapy   . Disturbance of skin sensation 04/16/2012  . Atrial fibrillation 04/07/2012  . Long term (current) use of anticoagulants 04/07/2012  . Cardiomyopathy   . Carotid  artery disease   . Back pain 08/08/2011  . Cough 08/08/2011  . Chronic systolic heart failure 03/14/2011  . Hypertension   . Mitral regurgitation   . Ejection fraction   . Dyslipidemia   . CAD (coronary artery disease)   . Hx of CABG   . ESRD (end stage renal disease)   . Diabetes mellitus   . Anemia   . PAD (peripheral artery disease)   . Aspirin allergy       Current Outpatient Prescriptions  Medication Sig Dispense Refill  . albuterol (PROVENTIL HFA;VENTOLIN HFA) 108 (90 BASE) MCG/ACT inhaler Inhale 2 puffs into the lungs daily.     Marland Kitchen albuterol (PROVENTIL) (2.5 MG/3ML) 0.083% nebulizer solution Take 2.5 mg by nebulization every 6 (six) hours as needed. For shortness of breath    . ALPRAZolam (XANAX) 0.5 MG tablet Take 0.5 mg by mouth 3 (three) times daily as needed. For anxiety     . amiodarone (PACERONE) 200 MG tablet Take 200 mg by mouth daily. 1/2 tab daily  3  . B Complex-C-Folic Acid (DIALYVITE PO) Take by mouth.    . cinacalcet (SENSIPAR) 30 MG tablet Take 30 mg by mouth daily.    Marland Kitchen HYDROcodone-acetaminophen (NORCO/VICODIN) 5-325 MG per tablet Take 1 tablet by mouth 3 (three) times daily.  0  . insulin NPH (HUMULIN N,NOVOLIN N) 100 UNIT/ML injection Inject 8 Units into the skin at bedtime. 40 units is the morning and 10 units every evening    . pantoprazole (PROTONIX) 20 MG tablet Take 20 mg by mouth daily.    . pravastatin (PRAVACHOL) 40 MG tablet TAKE 1 BY MOUTH DAILY 30 tablet 10  . sevelamer (RENVELA) 800 MG tablet Take 1,600-3,200 mg by mouth See admin instructions. Take 4 tablets with meals and 2 tablets with snacks.    . sodium chloride 0.9 % nebulizer solution Take 3 mLs by nebulization 4 (four) times daily as needed.  99  . warfarin (COUMADIN) 2 MG tablet TAKE ONE TABLET BY MOUTH DAILY OR AS DIRECTED BY COUMADIN CLINIC 45 tablet 3   No current facility-administered medications for this visit.    Allergies:   Aspirin; Penicillins; Septra; and Nsaids    Social  History:  The patient  reports that she quit smoking about 9 years ago. Her smoking use included Cigarettes. She has a 100 pack-year smoking history. She quit smokeless tobacco use about 9 years ago. She reports that she does not drink alcohol or use illicit drugs.   Family History:  The patient's family history includes Cancer in her brother, brother, mother, and sister; Diabetes in her brother, brother, daughter, father, mother, sister, sister, and son; Heart disease in her  father; Hypertension in her father; Kidney disease in her father.    ROS:  Please see the history of present illness.  Patient denies fever, chills, headache, sweats, rash, change in vision, change in hearing, chest pain, cough, nausea vomiting, urinary symptoms. She has cold hands and wears gloves. She requires continuous oxygen.      PHYSICAL EXAM: VS:  BP 100/62 mmHg  Pulse 54  Ht 5\' 3"  (1.6 m)  Wt 99 lb (44.906 kg)  BMI 17.54 kg/m2  LMP 08/06/1986 , The patient is in good spirits. She's here today with a walker wearing her continuous home O2. She's with a family member. She is oriented to person time and place. Affect is normal. She has significant kyphosis of the thoracic spine. Head is atraumatic. Sclera and conjunctiva are normal. There is no jugular venous distention. She has bilateral carotid bruits. Lungs are clear. Respiratory effort is nonlabored. Cardiac exam reveals a crescendo decrescendo systolic murmur. The second heart sound is heard. Abdomen is soft. There is no significant peripheral edema. She has ecchymoses on the lower extremities. There are no hematomas. Neurologic is grossly intact.  EKG:   EKG is not done today.   Recent Labs: 10/22/2014: ALT 14 10/24/2014: BUN 56*; Creatinine 5.05*; Hemoglobin 10.2*; Platelets 102*; Potassium 6.2*; Sodium 130*    Lipid Panel    Component Value Date/Time   CHOL  11/01/2008 1619    144        ATP III CLASSIFICATION:  <200     mg/dL   Desirable  161-096200-239   mg/dL   Borderline High  >=045>=240    mg/dL   High          TRIG 409175* 11/01/2008 1619   HDL 38* 11/01/2008 1619   CHOLHDL 3.8 11/01/2008 1619   VLDL 35 11/01/2008 1619   LDLCALC  11/01/2008 1619    71        Total Cholesterol/HDL:CHD Risk Coronary Heart Disease Risk Table                     Men   Women  1/2 Average Risk   3.4   3.3  Average Risk       5.0   4.4  2 X Average Risk   9.6   7.1  3 X Average Risk  23.4   11.0        Use the calculated Patient Ratio above and the CHD Risk Table to determine the patient's CHD Risk.        ATP III CLASSIFICATION (LDL):  <100     mg/dL   Optimal  [id number]  mg/dL   Near or Above                    Optimal  130-159  mg/dL   Borderline  [id number]  mg/dL   High  >213>190     mg/dL   Very High      Wt Readings from Last 3 Encounters:  11/02/14 99 lb (44.906 kg)  10/24/14 107 lb 12.9 oz (48.9 kg)  12/10/13 114 lb (51.71 kg)      Current medicines are reviewed. She has a complete understanding of her medications.       ASSESSMENT AND PLAN:

## 2014-11-02 NOTE — Assessment & Plan Note (Addendum)
The patient has carotid artery disease. She has not had a Doppler for 2 years. Carotid Doppler will be arranged..Marland Kitchen

## 2014-11-02 NOTE — Assessment & Plan Note (Addendum)
The patient does have aortic stenosis. Her mean gradient has been low but she does have left ventricular dysfunction. Second heart sound is heard. I'm not inclined to push for further aggressive evaluation at this time. No echo is planned as of today..Marland Kitchen

## 2014-11-02 NOTE — Assessment & Plan Note (Signed)
Patient continues on Coumadin. 

## 2014-11-02 NOTE — Assessment & Plan Note (Signed)
Coronary disease is stable. No change in therapy. 

## 2014-11-02 NOTE — Assessment & Plan Note (Signed)
Patient was recently hospitalized with volume overload. She has had a decrease in true body weight. Her dry weight has been changed at dialysis. She is stable at this time.

## 2014-11-02 NOTE — Patient Instructions (Addendum)
Your physician recommends that you schedule a follow-up appointment in: August 2016 with Dr. Myrtis SerKatz. You will receive a reminder letter in the mail in about 4 months reminding you to call and schedule your appointment. If you don't receive this letter, please contact our office. Your physician recommends that you continue on your current medications as directed. Please refer to the Current Medication list given to you today. Your physician recommends that you have lab work done to check your TSH. You may have this done at dialysis. Your physician has requested that you have a carotid duplex. This test is an ultrasound of the carotid arteries in your neck. It looks at blood flow through these arteries that supply the brain with blood. Allow one hour for this exam. There are no restrictions or special instructions.

## 2014-11-02 NOTE — Assessment & Plan Note (Signed)
She is holding sinus rhythm on low-dose amiodarone. Recent labs are stable but I do not see a TSH. We will arrange for this to be done over the next month through her dialysis unit.

## 2014-11-03 ENCOUNTER — Ambulatory Visit (INDEPENDENT_AMBULATORY_CARE_PROVIDER_SITE_OTHER): Payer: Medicare Other | Admitting: *Deleted

## 2014-11-03 DIAGNOSIS — Z5181 Encounter for therapeutic drug level monitoring: Secondary | ICD-10-CM

## 2014-11-03 DIAGNOSIS — I480 Paroxysmal atrial fibrillation: Secondary | ICD-10-CM

## 2014-11-03 DIAGNOSIS — I4891 Unspecified atrial fibrillation: Secondary | ICD-10-CM

## 2014-11-03 DIAGNOSIS — I5022 Chronic systolic (congestive) heart failure: Secondary | ICD-10-CM

## 2014-11-03 LAB — POCT INR: INR: 3.1

## 2014-11-17 ENCOUNTER — Encounter: Payer: Self-pay | Admitting: *Deleted

## 2014-11-17 ENCOUNTER — Ambulatory Visit (INDEPENDENT_AMBULATORY_CARE_PROVIDER_SITE_OTHER): Payer: Medicare Other | Admitting: *Deleted

## 2014-11-17 DIAGNOSIS — I4891 Unspecified atrial fibrillation: Secondary | ICD-10-CM

## 2014-11-17 DIAGNOSIS — I480 Paroxysmal atrial fibrillation: Secondary | ICD-10-CM

## 2014-11-17 DIAGNOSIS — I5022 Chronic systolic (congestive) heart failure: Secondary | ICD-10-CM | POA: Diagnosis not present

## 2014-11-17 DIAGNOSIS — Z5181 Encounter for therapeutic drug level monitoring: Secondary | ICD-10-CM | POA: Diagnosis not present

## 2014-11-17 LAB — POCT INR: INR: 1.7

## 2014-11-23 ENCOUNTER — Telehealth: Payer: Self-pay | Admitting: *Deleted

## 2014-11-23 NOTE — Telephone Encounter (Signed)
Pt started on Keflex 500mg  bid on 2/29.  Told pt keflex is not suppose to elevate INR.  Pt to continue current coumadin dose and recheck on 3/10 as previously scheduled.  Last INR was 1.7.  Pt verbalized understanding.

## 2014-11-23 NOTE — Telephone Encounter (Signed)
Patient has been prescribed an antibiotic

## 2014-12-01 ENCOUNTER — Encounter (INDEPENDENT_AMBULATORY_CARE_PROVIDER_SITE_OTHER): Payer: Medicare Other

## 2014-12-01 ENCOUNTER — Ambulatory Visit (INDEPENDENT_AMBULATORY_CARE_PROVIDER_SITE_OTHER): Payer: Medicare Other | Admitting: *Deleted

## 2014-12-01 DIAGNOSIS — I6523 Occlusion and stenosis of bilateral carotid arteries: Secondary | ICD-10-CM | POA: Diagnosis not present

## 2014-12-01 DIAGNOSIS — I480 Paroxysmal atrial fibrillation: Secondary | ICD-10-CM

## 2014-12-01 DIAGNOSIS — I4891 Unspecified atrial fibrillation: Secondary | ICD-10-CM | POA: Diagnosis not present

## 2014-12-01 DIAGNOSIS — I739 Peripheral vascular disease, unspecified: Principal | ICD-10-CM

## 2014-12-01 DIAGNOSIS — I5022 Chronic systolic (congestive) heart failure: Secondary | ICD-10-CM | POA: Diagnosis not present

## 2014-12-01 DIAGNOSIS — Z5181 Encounter for therapeutic drug level monitoring: Secondary | ICD-10-CM | POA: Diagnosis not present

## 2014-12-01 DIAGNOSIS — I779 Disorder of arteries and arterioles, unspecified: Secondary | ICD-10-CM

## 2014-12-01 LAB — POCT INR: INR: 2

## 2014-12-14 ENCOUNTER — Telehealth: Payer: Self-pay | Admitting: *Deleted

## 2014-12-14 NOTE — Telephone Encounter (Signed)
Pt made aware, forwarded to Dr. Howard 

## 2014-12-14 NOTE — Telephone Encounter (Signed)
-----   Message from Luis AbedJeffrey D Katz, MD sent at 12/14/2014 10:28 AM EDT ----- Please notify the patient that her carotid Doppler is stable. I am pleased with the result.

## 2014-12-20 ENCOUNTER — Ambulatory Visit (INDEPENDENT_AMBULATORY_CARE_PROVIDER_SITE_OTHER): Payer: Medicare Other | Admitting: *Deleted

## 2014-12-20 DIAGNOSIS — Z5181 Encounter for therapeutic drug level monitoring: Secondary | ICD-10-CM | POA: Diagnosis not present

## 2014-12-20 DIAGNOSIS — I480 Paroxysmal atrial fibrillation: Secondary | ICD-10-CM | POA: Diagnosis not present

## 2014-12-20 DIAGNOSIS — I4891 Unspecified atrial fibrillation: Secondary | ICD-10-CM | POA: Diagnosis not present

## 2014-12-20 DIAGNOSIS — I5022 Chronic systolic (congestive) heart failure: Secondary | ICD-10-CM | POA: Diagnosis not present

## 2014-12-20 LAB — POCT INR: INR: 2.3

## 2014-12-23 ENCOUNTER — Other Ambulatory Visit: Payer: Self-pay | Admitting: *Deleted

## 2014-12-23 MED ORDER — AMIODARONE HCL 200 MG PO TABS: 100.0000 mg | ORAL_TABLET | Freq: Every day | ORAL | Status: AC

## 2014-12-28 ENCOUNTER — Telehealth: Payer: Self-pay | Admitting: Cardiology

## 2014-12-28 ENCOUNTER — Other Ambulatory Visit: Payer: Self-pay

## 2014-12-28 MED ORDER — PRAVASTATIN SODIUM 40 MG PO TABS: ORAL_TABLET | ORAL | Status: AC

## 2014-12-28 NOTE — Telephone Encounter (Signed)
Has medication question about what was called into [facility]

## 2014-12-28 NOTE — Telephone Encounter (Signed)
Patient called to see why our office sent a prescription for pravastatin to [facility]. Patient was advised that the refill response was sent back based off of their request for refills on pravastatin. Patient said that [facility] this [facility] and didn't need pravastatin from [facility]. Nurse advised patient that [facility] would be contacted to cancel prescription.

## 2015-01-12 ENCOUNTER — Ambulatory Visit (INDEPENDENT_AMBULATORY_CARE_PROVIDER_SITE_OTHER): Payer: Medicare Other | Admitting: Pharmacist

## 2015-01-12 DIAGNOSIS — I480 Paroxysmal atrial fibrillation: Secondary | ICD-10-CM

## 2015-01-12 DIAGNOSIS — Z5181 Encounter for therapeutic drug level monitoring: Secondary | ICD-10-CM

## 2015-01-12 DIAGNOSIS — I4891 Unspecified atrial fibrillation: Secondary | ICD-10-CM

## 2015-01-12 DIAGNOSIS — I5022 Chronic systolic (congestive) heart failure: Secondary | ICD-10-CM

## 2015-01-12 LAB — POCT INR: INR: 1.3

## 2015-02-08 ENCOUNTER — Encounter: Payer: Self-pay | Admitting: *Deleted

## 2015-02-09 ENCOUNTER — Ambulatory Visit (INDEPENDENT_AMBULATORY_CARE_PROVIDER_SITE_OTHER): Payer: Medicare Other | Admitting: *Deleted

## 2015-02-09 DIAGNOSIS — I4891 Unspecified atrial fibrillation: Secondary | ICD-10-CM | POA: Diagnosis not present

## 2015-02-09 DIAGNOSIS — I5022 Chronic systolic (congestive) heart failure: Secondary | ICD-10-CM

## 2015-02-09 DIAGNOSIS — Z5181 Encounter for therapeutic drug level monitoring: Secondary | ICD-10-CM

## 2015-02-09 LAB — POCT INR: INR: 1.8

## 2015-02-13 ENCOUNTER — Encounter: Payer: Self-pay | Admitting: *Deleted

## 2015-02-13 ENCOUNTER — Ambulatory Visit (INDEPENDENT_AMBULATORY_CARE_PROVIDER_SITE_OTHER): Payer: Medicare Other | Admitting: Cardiology

## 2015-02-13 ENCOUNTER — Encounter: Payer: Self-pay | Admitting: Cardiology

## 2015-02-13 VITALS — BP 100/58 | HR 60 | Ht 63.0 in | Wt 105.0 lb

## 2015-02-13 DIAGNOSIS — I350 Nonrheumatic aortic (valve) stenosis: Secondary | ICD-10-CM

## 2015-02-13 DIAGNOSIS — I480 Paroxysmal atrial fibrillation: Secondary | ICD-10-CM | POA: Diagnosis not present

## 2015-02-13 DIAGNOSIS — N186 End stage renal disease: Secondary | ICD-10-CM | POA: Diagnosis not present

## 2015-02-13 DIAGNOSIS — R0989 Other specified symptoms and signs involving the circulatory and respiratory systems: Secondary | ICD-10-CM | POA: Diagnosis not present

## 2015-02-13 DIAGNOSIS — R9430 Abnormal result of cardiovascular function study, unspecified: Secondary | ICD-10-CM

## 2015-02-13 DIAGNOSIS — Z992 Dependence on renal dialysis: Secondary | ICD-10-CM

## 2015-02-13 DIAGNOSIS — Z7901 Long term (current) use of anticoagulants: Secondary | ICD-10-CM

## 2015-02-13 NOTE — Progress Notes (Signed)
Cardiology Office Note   Date:  02/13/2015   ID:  [name], DOB 01/15/1941, MRN [id number]  PCP:  Selinda Flavin, MD  Cardiologist:  Willa Rough, MD   Chief Complaint  Patient presents with  . Appointment    Follow-up CHF, ischemic cardio myopathy, paroxysmal atrial fibrillation      History of Present Illness: [name] is a 74 y.o. female who presents for follow-up of coronary disease, ischemic cardiomyopathy, paroxysmal atrial fibrillation, end-stage renal disease. I saw her last in the office February, 2016. She has been admitted to [facility]. I do not have those records yet. It appears that she had congestive heart failure. Ultimately fluid was removed and she is feeling better. I don't have any reports of return to atrial fibrillation, but I do not yet have the data. She's not having any chest pain. She is continuing on low-dose amiodarone. Her TSH was in the normal range 1 was checked since her last visit.    Past Medical History  Diagnosis Date  . Mitral regurgitation     Mild-to-moderate, echo, January, 2010 / moderate, e eccentric jet, echo, March, 2012, moderate to moderately severe by bedside echocardiogram December 2012  . Ejection fraction     Ejection fraction 25-30% January, 2013  . Dyslipidemia   . CAD (coronary artery disease)     Nuclear, January, 2010, no ischemia  . Hx of CABG     2007  . Cardiomyopathy     Ischemic status post coronary bypass grafting  . Diabetes mellitus   . Anemia     Chronic  . PAD (peripheral artery disease)     Right axillofemoral bypass, Dr. Arbie Cookey status post redo right axillofemoral bypass August 2012  . Aspirin allergy     Takes Plavix  . Hypertension   . Carotid artery disease     Doppler, May, 2012, 50-69% R. ICA, 50-69% LICA  . Hyperlipidemia   . Arthritis   . Anxiety   . CHF (congestive heart failure) 03/23/12  . H/O amiodarone therapy     Started July, 2013  . PONV (postoperative nausea and  vomiting)   . Atrial fibrillation     New onset July, 2013  . GERD (gastroesophageal reflux disease)   . ESRD (end stage renal disease) on dialysis     Dialysis, right arm shunt  / PCI tissue, interventional radiology September, 2010 / temporary catheter left subclavian, new AV fistula right arm maturing March, 201 t2  . ESRD     dialysis Tu,Thur,Sat  . COPD (chronic obstructive pulmonary disease)     uses oxygen 2 liters 24/7  . Fatigue     Fatigue and sleepiness since hospitalization December, 2013  . Myocardial infarction   . ESRD on hemodialysis 10/22/2014    Started dialysis in 2010, cause of ESRD was DM.  She goes to [facility] in [place] on TTS schedule.  Right upper arm AVF as of Jan 2016.       Past Surgical History  Procedure Laterality Date  . Coronary artery bypass graft   07/27/2006    [name]. [name], M.D  . Abdominal hysterectomy    . Laparoscopic salpingoopherectomy    . Appendectomy    . Fem-fem bypass  2003    left to right   . Right femoral bypass  2004  . Basal cell carcinoma excision  2004    REMOVAL FROM NOSE  . Arteriovenous graft placement  right  11-30-2010,  05-20-2011  . Femoral-femoral bypass graft  06/12/2012    Procedure: BYPASS GRAFT FEMORAL-FEMORAL ARTERY;  Surgeon: Larina Earthlyodd F Early, MD;  [facility];  Service: Vascular;  Laterality: N/A;  Right to left femoral to femoral bypass  . Abdominal aortagram N/A 04/22/2012    Procedure: ABDOMINAL AORTAGRAM;  Surgeon: Larina Earthlyodd F Early, MD;  [facility];  Service: Cardiovascular;  Laterality: N/A;    Patient Active Problem List   Diagnosis Date Noted  . Warfarin anticoagulation 11/02/2014  . ESRD on hemodialysis 10/22/2014  . Hyperkalemia 10/22/2014  . On home O2 12/10/2013  . Aortic stenosis 12/09/2013  . Encounter for therapeutic drug monitoring 10/22/2013  . Cold right foot 10/07/2013  . PVD (peripheral vascular disease) 10/07/2013  . Fatigue   . H/O amiodarone therapy   . Disturbance  of skin sensation 04/16/2012  . Atrial fibrillation 04/07/2012  . Long term (current) use of anticoagulants 04/07/2012  . Cardiomyopathy   . Carotid artery disease   . Back pain 08/08/2011  . Cough 08/08/2011  . Chronic systolic heart failure 03/14/2011  . Hypertension   . Mitral regurgitation   . Ejection fraction   . Dyslipidemia   . CAD (coronary artery disease)   . Hx of CABG   . ESRD (end stage renal disease)   . Diabetes mellitus   . Anemia   . PAD (peripheral artery disease)   . Aspirin allergy       Current Outpatient Prescriptions  Medication Sig Dispense Refill  . albuterol (PROVENTIL HFA;VENTOLIN HFA) 108 (90 BASE) MCG/ACT inhaler Inhale 2 puffs into the lungs daily.     Marland Kitchen. albuterol (PROVENTIL) (2.5 MG/3ML) 0.083% nebulizer solution Take 2.5 mg by nebulization every 6 (six) hours as needed. For shortness of breath    . ALPRAZolam (XANAX) 0.5 MG tablet Take 0.5 mg by mouth 3 (three) times daily as needed. For anxiety     . amiodarone (PACERONE) 200 MG tablet Take 0.5 tablets (100 mg total) by mouth daily. 45 tablet 3  . B Complex-C-Folic Acid (DIALYVITE PO) Take by mouth.    . cinacalcet (SENSIPAR) 30 MG tablet Take 30 mg by mouth daily.    Marland Kitchen. HYDROcodone-acetaminophen (NORCO/VICODIN) 5-325 MG per tablet Take 1 tablet by mouth 3 (three) times daily.  0  . insulin NPH (HUMULIN N,NOVOLIN N) 100 UNIT/ML injection Inject 8 Units into the skin at bedtime. 40 units is the morning and 10 units every evening prn    . pantoprazole (PROTONIX) 20 MG tablet Take 20 mg by mouth daily.    . pravastatin (PRAVACHOL) 40 MG tablet TAKE 1 BY MOUTH DAILY 90 tablet 2  . sevelamer (RENVELA) 800 MG tablet Take 1,600-3,200 mg by mouth See admin instructions. Take 4 tablets with meals and 2 tablets with snacks.    . sodium chloride 0.9 % nebulizer solution Take 3 mLs by nebulization 4 (four) times daily as needed.  99  . warfarin (COUMADIN) 2 MG tablet TAKE ONE TABLET BY MOUTH DAILY OR AS  DIRECTED BY COUMADIN CLINIC 45 tablet 3   No current facility-administered medications for this visit.    Allergies:   Aspirin; Penicillins; Septra; Nsaids; and Percocet    Social History:  The patient  reports that she quit smoking about 9 years ago. Her smoking use included Cigarettes. She has a 100 pack-year smoking history. She quit smokeless tobacco use about 9 years ago. She reports that she does not drink alcohol or use illicit drugs.  Family History:  The patient's family history includes Cancer in her brother, brother, mother, and sister; Diabetes in her brother, brother, daughter, father, mother, sister, sister, and son; Heart disease in her father; Hypertension in her father; Kidney disease in her father.    ROS:  Please see the history of present illness.   Patient denies fever, chills, headache, sweats, rash, change in vision, change in hearing, chest pain, cough, nausea or vomiting, urinary symptoms. All other systems are reviewed and are negative.     PHYSICAL EXAM: VS:  BP 100/58 mmHg  Pulse 60  Ht  (1.6 m)  Wt 105 lb (47.628 kg)  BMI 18.60 kg/m2  LMP 08/06/1986 , Patient is very brave as she faces her recurrent medical problems. She seems a little discouraged today. She is oriented to person time and place. Affect is normal. Head is atraumatic. Sclera and conjunctiva are normal. There is no jugulovenous distention. Lungs revealed decreased breath sounds. Cardiac exam reveals S1 and S2. There is a crescendo decrescendo systolic murmur of aortic stenosis. The second heart sound is heard but I'm not sure if there are 2 components. The abdomen is soft. There is no peripheral edema. She has significant kyphosis of the spine. There are no skin rashes.  EKG:   EKG is not done today. We will obtain hospital EKGs.   Recent Labs: 10/22/2014: ALT 14 10/24/2014: BUN 56*; Creatinine 5.05*; Hemoglobin 10.2*; Platelets 102*; Potassium 6.2*; Sodium 130*    Lipid Panel      Component Value Date/Time   CHOL  11/01/2008 1619    144        ATP III CLASSIFICATION:  <200     mg/dL   Desirable  161-096  mg/dL   Borderline High  >=045    mg/dL   High          TRIG 409* 11/01/2008 1619   HDL 38* 11/01/2008 1619   CHOLHDL 3.8 11/01/2008 1619   VLDL 35 11/01/2008 1619   LDLCALC  11/01/2008 1619    71        Total Cholesterol/HDL:CHD Risk Coronary Heart Disease Risk Table                     Men   Women  1/2 Average Risk   3.4   3.3  Average Risk       5.0   4.4  2 X Average Risk   9.6   7.1  3 X Average Risk  23.4   11.0        Use the calculated Patient Ratio above and the CHD Risk Table to determine the patient's CHD Risk.        ATP III CLASSIFICATION (LDL):  <100     mg/dL   Optimal  811-914  mg/dL   Near or Above                    Optimal  130-159  mg/dL   Borderline  782-956  mg/dL   High  >213     mg/dL   Very High      Wt Readings from Last 3 Encounters:  02/13/15 105 lb (47.628 kg)  11/02/14 99 lb (44.906 kg)  10/24/14 107 lb 12.9 oz (48.9 kg)      Current medicines are reviewed  The patient understands her medications.     ASSESSMENT AND PLAN:

## 2015-02-13 NOTE — Assessment & Plan Note (Signed)
The patient's volume has to be controlled through her dialysis.

## 2015-02-13 NOTE — Assessment & Plan Note (Signed)
For today she will remain on amiodarone. I will check [facility] EKGs.

## 2015-02-13 NOTE — Assessment & Plan Note (Signed)
EF in January, 2015 was 25-30%. We will reassess her EF along with severity of aortic stenosis.

## 2015-02-13 NOTE — Assessment & Plan Note (Signed)
The patient has aortic stenosis. It is possible that it is becoming more significant. This will be quite problematic as she is not a candidate for surgery. With her renal failure I do not know if she could be considered for tavern or not. We will do a follow-up echo.

## 2015-02-13 NOTE — Assessment & Plan Note (Signed)
Warfarin is being continued at this time for her paroxysmal atrial fibrillation.

## 2015-02-13 NOTE — Patient Instructions (Signed)
Your physician has requested that you have an echocardiogram. Echocardiography is a painless test that uses sound waves to create images of your heart. It provides your doctor with information about the size and shape of your heart and how well your heart's chambers and valves are working. This procedure takes approximately one hour. There are no restrictions for this procedure. Office will contact with results via phone or letter.   Continue all current medications. Follow up in  September

## 2015-02-15 ENCOUNTER — Ambulatory Visit (INDEPENDENT_AMBULATORY_CARE_PROVIDER_SITE_OTHER): Payer: Medicare Other

## 2015-02-15 ENCOUNTER — Other Ambulatory Visit: Payer: Self-pay

## 2015-02-15 DIAGNOSIS — R9430 Abnormal result of cardiovascular function study, unspecified: Secondary | ICD-10-CM

## 2015-02-15 DIAGNOSIS — R0989 Other specified symptoms and signs involving the circulatory and respiratory systems: Secondary | ICD-10-CM | POA: Diagnosis not present

## 2015-02-15 DIAGNOSIS — I350 Nonrheumatic aortic (valve) stenosis: Secondary | ICD-10-CM

## 2015-02-23 ENCOUNTER — Telehealth: Payer: Self-pay | Admitting: Cardiology

## 2015-02-23 NOTE — Telephone Encounter (Signed)
Mrs. [name] called wanting to know if we have results for her echo. She is currently [facility]patient [facility]. Her daughter said To call the cell phone # and if they don't answer someone will call back.

## 2015-02-23 NOTE — Telephone Encounter (Signed)
Please find a way to fax this data to the Hospital, so that it can be added to her chart. Please call the patient. Her heart muscle function is weaked, similar to the past. The aortic valve narrowing is only mild. There is more mitral leaking than we have seen before.

## 2015-02-24 NOTE — Telephone Encounter (Signed)
Patient and son informed

## 2015-02-24 NOTE — Telephone Encounter (Signed)
Per staff nurse, patient is being d/c today and no one has requested the echo report and they didn't think we should send it there.

## 2015-03-09 ENCOUNTER — Ambulatory Visit (INDEPENDENT_AMBULATORY_CARE_PROVIDER_SITE_OTHER): Payer: Medicare Other | Admitting: *Deleted

## 2015-03-09 DIAGNOSIS — I5022 Chronic systolic (congestive) heart failure: Secondary | ICD-10-CM | POA: Diagnosis not present

## 2015-03-09 DIAGNOSIS — Z5181 Encounter for therapeutic drug level monitoring: Secondary | ICD-10-CM | POA: Diagnosis not present

## 2015-03-09 DIAGNOSIS — I4891 Unspecified atrial fibrillation: Secondary | ICD-10-CM

## 2015-03-09 LAB — POCT INR: INR: 1.7

## 2015-03-14 ENCOUNTER — Telehealth: Payer: Self-pay | Admitting: *Deleted

## 2015-03-14 NOTE — Telephone Encounter (Signed)
Pt called.  States she clotted off at dialysis today.  Had to stop treatment 45 min early.  INR was 1.7 on 6/16 and coumadin dose was increased.  Offer to check INR today or Thursday.  Pt does not have transportation. States she doesn't think she can get hear until scheduled appt on 6/30.  After reviewing dose change, told pt to increase coumadin to1mg  daily except 2mg  on M,W,F until INR check on 6/30.  Pt verbalized understanding.

## 2015-03-23 ENCOUNTER — Ambulatory Visit (INDEPENDENT_AMBULATORY_CARE_PROVIDER_SITE_OTHER): Payer: Medicare Other | Admitting: *Deleted

## 2015-03-23 DIAGNOSIS — Z5181 Encounter for therapeutic drug level monitoring: Secondary | ICD-10-CM

## 2015-03-23 DIAGNOSIS — I4891 Unspecified atrial fibrillation: Secondary | ICD-10-CM | POA: Diagnosis not present

## 2015-03-23 DIAGNOSIS — I5022 Chronic systolic (congestive) heart failure: Secondary | ICD-10-CM | POA: Diagnosis not present

## 2015-03-23 LAB — POCT INR: INR: 2.4

## 2015-04-06 ENCOUNTER — Ambulatory Visit (INDEPENDENT_AMBULATORY_CARE_PROVIDER_SITE_OTHER): Payer: Medicare Other | Admitting: *Deleted

## 2015-04-06 DIAGNOSIS — I5022 Chronic systolic (congestive) heart failure: Secondary | ICD-10-CM

## 2015-04-06 DIAGNOSIS — Z5181 Encounter for therapeutic drug level monitoring: Secondary | ICD-10-CM | POA: Diagnosis not present

## 2015-04-06 DIAGNOSIS — I4891 Unspecified atrial fibrillation: Secondary | ICD-10-CM | POA: Diagnosis not present

## 2015-04-06 LAB — POCT INR: INR: 2.9

## 2015-05-25 DEATH — deceased

## 2015-06-07 ENCOUNTER — Ambulatory Visit: Payer: No Typology Code available for payment source | Admitting: Cardiology

## 2016-07-31 IMAGING — CR DG CHEST 1V PORT SAME DAY
1 series · 1 of 1 positions shown · non-contrast
Comparison: 10/21/2014, 09/27/2014

CLINICAL DATA: 73-year-old female with a history of shortness of
breath and cough since this morning

EXAM:
PORTABLE CHEST - 1 VIEW SAME DAY

[AP]
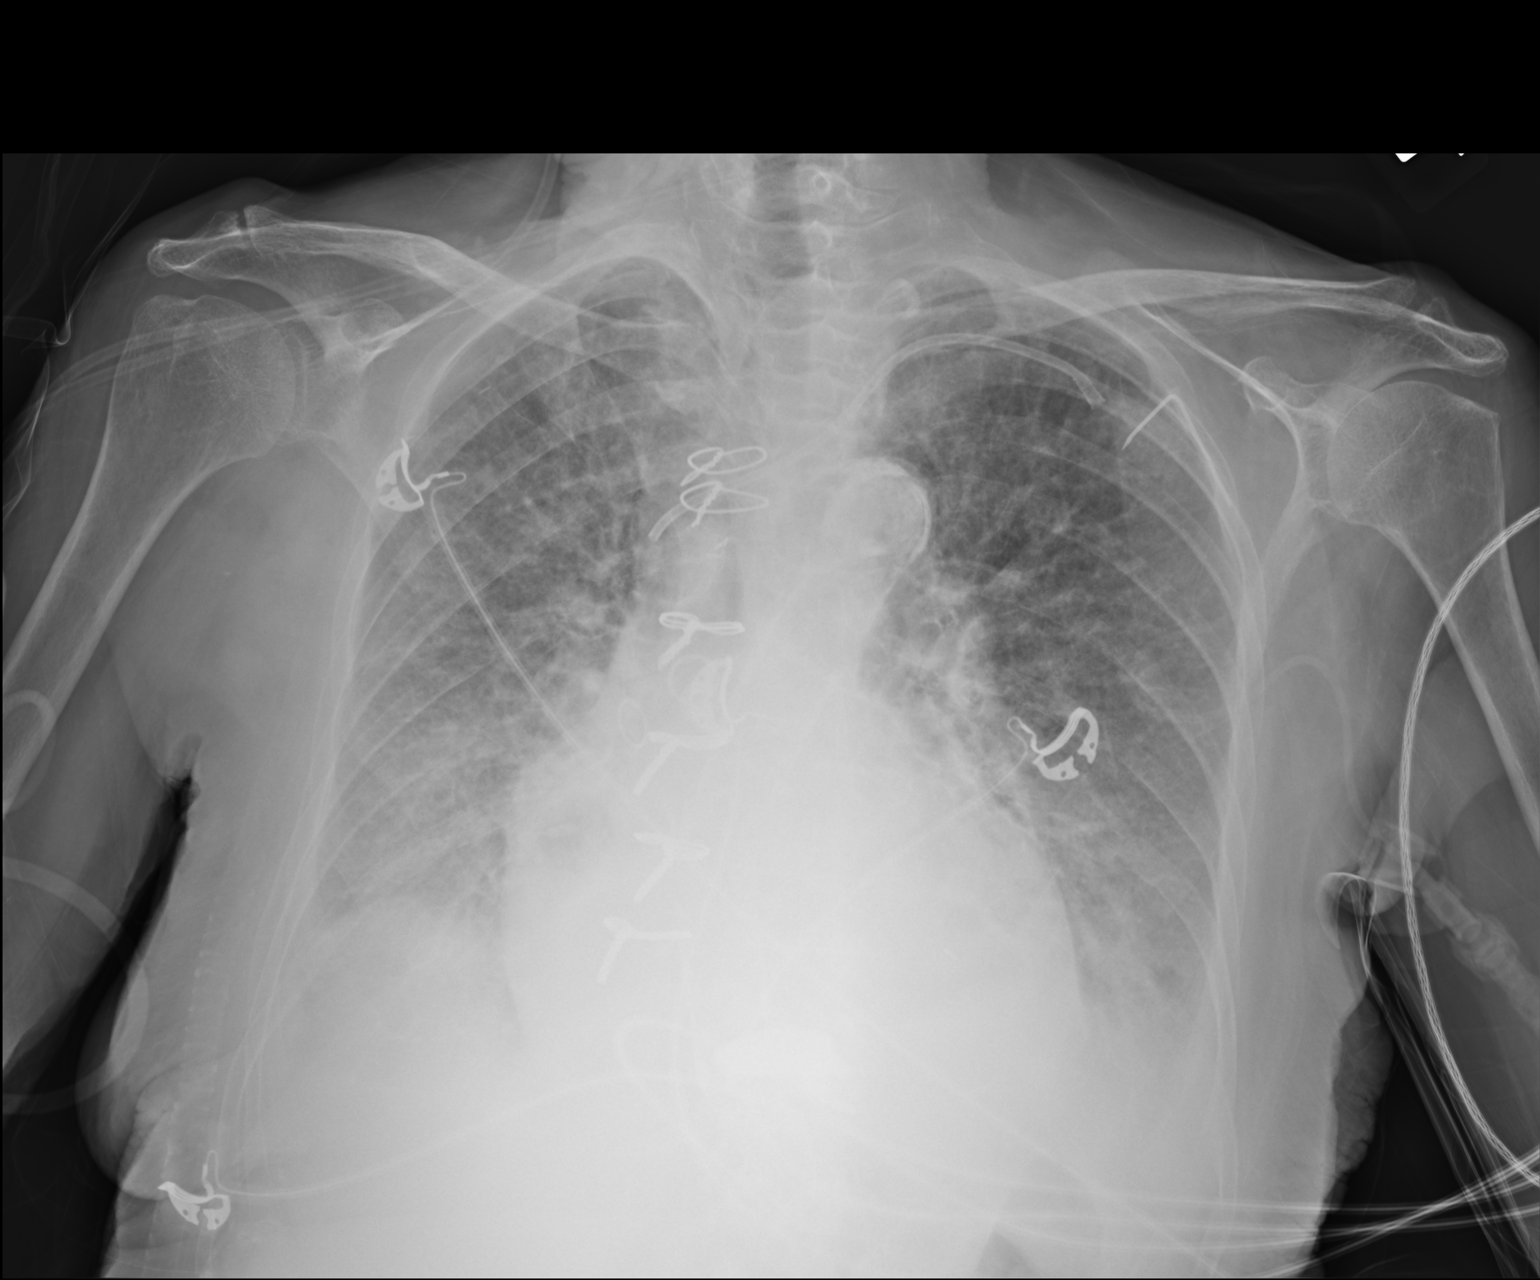

[1 of 1 positions shown; findings below may reference images not displayed]

FINDINGS: The cardiomediastinal silhouette is unchanged in size and contour
with persisting cardiomegaly. Dense calcifications of the aorta are
again evident.

Diffuse interlobular septal thickening.

Slight improvement ever aeration at the right base, though with
there is persistent mixed airspace and interstitial disease,
prominent at the bases. Obscuration of the bilateral hemidiaphragm
and costophrenic angles.

Unchanged position of left-sided port catheter within left
subclavian vein in appearing to terminate in the superior vena cava.
Chikii needle in place.

Surgical changes of prior median sternotomy and CABG.

Surgical changes of axillary femoral bypass on the right chest.
IMPRESSION: Persisting interstitial and airspace disease, with slight
improvement of aeration at the right base. Changes compatible with
congestive heart failure and edema.

Likely persistent bilateral small pleural effusions with associated
atelectasis.

Advanced atherosclerosis.
